# Patient Record
Sex: Female | Born: 1955 | Race: Black or African American | Hispanic: No | Smoking: Never smoker
Health system: Southern US, Community
[De-identification: ages and names within clinical notes are randomized; demographics above are authoritative.]

## PROBLEM LIST (undated history)

## (undated) DIAGNOSIS — E042 Nontoxic multinodular goiter: Principal | ICD-10-CM

## (undated) DIAGNOSIS — N6320 Unspecified lump in the left breast, unspecified quadrant: Secondary | ICD-10-CM

## (undated) DIAGNOSIS — D249 Benign neoplasm of unspecified breast: Secondary | ICD-10-CM

## (undated) DIAGNOSIS — E669 Obesity, unspecified: Secondary | ICD-10-CM

## (undated) DIAGNOSIS — E1129 Type 2 diabetes mellitus with other diabetic kidney complication: Secondary | ICD-10-CM

## (undated) DIAGNOSIS — E119 Type 2 diabetes mellitus without complications: Secondary | ICD-10-CM

## (undated) DIAGNOSIS — I10 Essential (primary) hypertension: Secondary | ICD-10-CM

## (undated) DIAGNOSIS — T7840XA Allergy, unspecified, initial encounter: Secondary | ICD-10-CM

## (undated) DIAGNOSIS — K219 Gastro-esophageal reflux disease without esophagitis: Secondary | ICD-10-CM

## (undated) DIAGNOSIS — K760 Fatty (change of) liver, not elsewhere classified: Principal | ICD-10-CM

## (undated) HISTORY — PX: COLONOSCOPY: SHX174

## (undated) HISTORY — DX: Nontoxic multinodular goiter: E04.2

## (undated) HISTORY — DX: Type 2 diabetes mellitus without complications: E11.9

## (undated) HISTORY — DX: Type 2 diabetes mellitus with other diabetic kidney complication: E11.29

## (undated) HISTORY — DX: Allergy, unspecified, initial encounter: T78.40XA

## (undated) HISTORY — PX: UPPER GASTROINTESTINAL ENDOSCOPY: SHX188

## (undated) HISTORY — DX: Obesity, unspecified: E66.9

## (undated) HISTORY — DX: Benign neoplasm of unspecified breast: D24.9

## (undated) HISTORY — PX: BIOPSY THYROID: PRO38

## (undated) HISTORY — DX: Gastro-esophageal reflux disease without esophagitis: K21.9

## (undated) HISTORY — DX: Fatty (change of) liver, not elsewhere classified: K76.0

## (undated) HISTORY — PX: TUBAL LIGATION: SHX77

## (undated) HISTORY — DX: Essential (primary) hypertension: I10

## (undated) HISTORY — PX: BREAST EXCISIONAL BIOPSY: SUR124

---

## 1997-11-11 ENCOUNTER — Ambulatory Visit (HOSPITAL_COMMUNITY): Admission: RE | Admit: 1997-11-11 | Discharge: 1997-11-11 | Payer: Self-pay | Admitting: Obstetrics and Gynecology

## 1998-12-09 ENCOUNTER — Ambulatory Visit (HOSPITAL_COMMUNITY): Admission: RE | Admit: 1998-12-09 | Discharge: 1998-12-09 | Payer: Self-pay | Admitting: Obstetrics and Gynecology

## 1998-12-09 ENCOUNTER — Encounter: Payer: Self-pay | Admitting: Obstetrics and Gynecology

## 1999-05-08 ENCOUNTER — Encounter: Payer: Self-pay | Admitting: Family Medicine

## 1999-05-08 LAB — CONVERTED CEMR LAB

## 1999-10-08 ENCOUNTER — Emergency Department (HOSPITAL_COMMUNITY): Admission: EM | Admit: 1999-10-08 | Discharge: 1999-10-08 | Payer: Self-pay | Admitting: *Deleted

## 1999-10-08 ENCOUNTER — Encounter: Payer: Self-pay | Admitting: *Deleted

## 2000-01-13 ENCOUNTER — Encounter: Payer: Self-pay | Admitting: Obstetrics and Gynecology

## 2000-01-13 ENCOUNTER — Ambulatory Visit (HOSPITAL_COMMUNITY): Admission: RE | Admit: 2000-01-13 | Discharge: 2000-01-13 | Payer: Self-pay | Admitting: Obstetrics and Gynecology

## 2001-01-22 ENCOUNTER — Encounter: Payer: Self-pay | Admitting: Family Medicine

## 2001-01-22 ENCOUNTER — Encounter: Admission: RE | Admit: 2001-01-22 | Discharge: 2001-01-22 | Payer: Self-pay | Admitting: Internal Medicine

## 2001-01-28 ENCOUNTER — Encounter: Admission: RE | Admit: 2001-01-28 | Discharge: 2001-02-27 | Payer: Self-pay | Admitting: Family Medicine

## 2001-02-15 ENCOUNTER — Encounter: Payer: Self-pay | Admitting: Obstetrics and Gynecology

## 2001-02-15 ENCOUNTER — Ambulatory Visit (HOSPITAL_COMMUNITY): Admission: RE | Admit: 2001-02-15 | Discharge: 2001-02-15 | Payer: Self-pay | Admitting: Obstetrics and Gynecology

## 2002-02-25 ENCOUNTER — Ambulatory Visit (HOSPITAL_COMMUNITY): Admission: RE | Admit: 2002-02-25 | Discharge: 2002-02-25 | Payer: Self-pay | Admitting: Obstetrics and Gynecology

## 2002-02-25 ENCOUNTER — Encounter: Payer: Self-pay | Admitting: Obstetrics and Gynecology

## 2003-05-19 ENCOUNTER — Ambulatory Visit (HOSPITAL_COMMUNITY): Admission: RE | Admit: 2003-05-19 | Discharge: 2003-05-19 | Payer: Self-pay | Admitting: Obstetrics and Gynecology

## 2003-05-19 ENCOUNTER — Encounter: Payer: Self-pay | Admitting: Obstetrics and Gynecology

## 2004-05-19 ENCOUNTER — Ambulatory Visit (HOSPITAL_COMMUNITY): Admission: RE | Admit: 2004-05-19 | Discharge: 2004-05-19 | Payer: Self-pay | Admitting: Obstetrics and Gynecology

## 2005-01-11 ENCOUNTER — Ambulatory Visit: Payer: Self-pay | Admitting: Family Medicine

## 2005-01-25 ENCOUNTER — Encounter: Admission: RE | Admit: 2005-01-25 | Discharge: 2005-01-25 | Payer: Self-pay | Admitting: Family Medicine

## 2005-01-31 ENCOUNTER — Ambulatory Visit: Payer: Self-pay | Admitting: Family Medicine

## 2005-04-25 ENCOUNTER — Ambulatory Visit: Payer: Self-pay | Admitting: Family Medicine

## 2005-05-17 ENCOUNTER — Ambulatory Visit: Payer: Self-pay | Admitting: Family Medicine

## 2005-05-23 ENCOUNTER — Ambulatory Visit (HOSPITAL_COMMUNITY): Admission: RE | Admit: 2005-05-23 | Discharge: 2005-05-23 | Payer: Self-pay | Admitting: Obstetrics and Gynecology

## 2005-07-18 ENCOUNTER — Ambulatory Visit: Payer: Self-pay | Admitting: Family Medicine

## 2005-09-20 ENCOUNTER — Ambulatory Visit: Payer: Self-pay | Admitting: Family Medicine

## 2006-01-10 ENCOUNTER — Ambulatory Visit: Payer: Self-pay | Admitting: Family Medicine

## 2006-06-18 ENCOUNTER — Ambulatory Visit (HOSPITAL_COMMUNITY): Admission: RE | Admit: 2006-06-18 | Discharge: 2006-06-18 | Payer: Self-pay | Admitting: Obstetrics and Gynecology

## 2006-06-22 ENCOUNTER — Ambulatory Visit: Payer: Self-pay | Admitting: Family Medicine

## 2006-07-25 ENCOUNTER — Ambulatory Visit: Payer: Self-pay | Admitting: Family Medicine

## 2007-06-24 ENCOUNTER — Ambulatory Visit (HOSPITAL_COMMUNITY): Admission: RE | Admit: 2007-06-24 | Discharge: 2007-06-24 | Payer: Self-pay | Admitting: Obstetrics and Gynecology

## 2007-06-28 ENCOUNTER — Telehealth: Payer: Self-pay | Admitting: Family Medicine

## 2007-10-28 ENCOUNTER — Telehealth: Payer: Self-pay | Admitting: Family Medicine

## 2007-10-31 ENCOUNTER — Encounter: Payer: Self-pay | Admitting: Family Medicine

## 2007-10-31 DIAGNOSIS — R7301 Impaired fasting glucose: Secondary | ICD-10-CM | POA: Insufficient documentation

## 2007-10-31 DIAGNOSIS — I10 Essential (primary) hypertension: Secondary | ICD-10-CM | POA: Insufficient documentation

## 2007-10-31 DIAGNOSIS — R739 Hyperglycemia, unspecified: Secondary | ICD-10-CM | POA: Insufficient documentation

## 2007-11-01 ENCOUNTER — Encounter: Payer: Self-pay | Admitting: Family Medicine

## 2007-11-01 ENCOUNTER — Ambulatory Visit: Payer: Self-pay | Admitting: Family Medicine

## 2007-11-01 DIAGNOSIS — G2581 Restless legs syndrome: Secondary | ICD-10-CM | POA: Insufficient documentation

## 2007-11-04 LAB — CONVERTED CEMR LAB
ALT: 17 units/L (ref 0–35)
Albumin: 4 g/dL (ref 3.5–5.2)
Alkaline Phosphatase: 76 units/L (ref 39–117)
BUN: 9 mg/dL (ref 6–23)
Basophils Absolute: 0.1 10*3/uL (ref 0.0–0.1)
Basophils Relative: 1.7 % — ABNORMAL HIGH (ref 0.0–1.0)
Bilirubin, Direct: 0.1 mg/dL (ref 0.0–0.3)
CO2: 29 meq/L (ref 19–32)
Calcium: 9.1 mg/dL (ref 8.4–10.5)
Cholesterol: 180 mg/dL (ref 0–200)
Creatinine, Ser: 0.9 mg/dL (ref 0.4–1.2)
GFR calc Af Amer: 85 mL/min
GFR calc non Af Amer: 70 mL/min
Glucose, Bld: 107 mg/dL — ABNORMAL HIGH (ref 70–99)
HCT: 38.3 % (ref 36.0–46.0)
Hemoglobin: 12.5 g/dL (ref 12.0–15.0)
LDL Cholesterol: 104 mg/dL — ABNORMAL HIGH (ref 0–99)
MCHC: 32.5 g/dL (ref 30.0–36.0)
Monocytes Absolute: 0.4 10*3/uL (ref 0.1–1.0)
Neutro Abs: 2 10*3/uL (ref 1.4–7.7)
Phosphorus: 3 mg/dL (ref 2.3–4.6)
Potassium: 4 meq/L (ref 3.5–5.1)
RBC: 4.94 M/uL (ref 3.87–5.11)
RDW: 12.9 % (ref 11.5–14.6)
Sodium: 140 meq/L (ref 135–145)
TSH: 0.69 microintl units/mL (ref 0.35–5.50)
Total Bilirubin: 1 mg/dL (ref 0.3–1.2)
Total CHOL/HDL Ratio: 2.9
Total Protein: 7.6 g/dL (ref 6.0–8.3)
Triglycerides: 73 mg/dL (ref 0–149)

## 2007-11-06 ENCOUNTER — Telehealth: Payer: Self-pay | Admitting: Family Medicine

## 2007-11-25 ENCOUNTER — Encounter: Payer: Self-pay | Admitting: Family Medicine

## 2008-08-12 ENCOUNTER — Ambulatory Visit (HOSPITAL_COMMUNITY): Admission: RE | Admit: 2008-08-12 | Discharge: 2008-08-12 | Payer: Self-pay | Admitting: Obstetrics and Gynecology

## 2008-12-05 LAB — CONVERTED CEMR LAB: Pap Smear: NORMAL

## 2008-12-30 ENCOUNTER — Ambulatory Visit: Payer: Self-pay | Admitting: Family Medicine

## 2008-12-30 DIAGNOSIS — L259 Unspecified contact dermatitis, unspecified cause: Secondary | ICD-10-CM | POA: Insufficient documentation

## 2008-12-30 DIAGNOSIS — D509 Iron deficiency anemia, unspecified: Secondary | ICD-10-CM | POA: Insufficient documentation

## 2009-01-01 LAB — CONVERTED CEMR LAB
ALT: 13 units/L (ref 0–35)
AST: 20 units/L (ref 0–37)
Albumin: 3.9 g/dL (ref 3.5–5.2)
Alkaline Phosphatase: 66 units/L (ref 39–117)
BUN: 10 mg/dL (ref 6–23)
Basophils Absolute: 0 10*3/uL (ref 0.0–0.1)
Basophils Relative: 0.8 % (ref 0.0–3.0)
Bilirubin, Direct: 0.1 mg/dL (ref 0.0–0.3)
CO2: 30 meq/L (ref 19–32)
Calcium: 9.2 mg/dL (ref 8.4–10.5)
Chloride: 112 meq/L (ref 96–112)
Cholesterol: 169 mg/dL (ref 0–200)
Creatinine, Ser: 0.8 mg/dL (ref 0.4–1.2)
Eosinophils Absolute: 0.2 10*3/uL (ref 0.0–0.7)
Eosinophils Relative: 4.5 % (ref 0.0–5.0)
Ferritin: 35 ng/mL (ref 10.0–291.0)
Glucose, Bld: 100 mg/dL — ABNORMAL HIGH (ref 70–99)
HCT: 35.3 % — ABNORMAL LOW (ref 36.0–46.0)
HDL: 55.5 mg/dL (ref >39.00)
Hemoglobin: 12.1 g/dL (ref 12.0–15.0)
Hgb A1c MFr Bld: 5.6 % (ref 4.6–6.5)
LDL Cholesterol: 103 mg/dL — ABNORMAL HIGH (ref 0–99)
Lymphocytes Relative: 42.9 % (ref 12.0–46.0)
Lymphs Abs: 2.1 10*3/uL (ref 0.7–4.0)
MCHC: 34.4 g/dL (ref 30.0–36.0)
MCV: 74.8 fL — ABNORMAL LOW (ref 78.0–100.0)
Monocytes Absolute: 0.4 10*3/uL (ref 0.1–1.0)
Monocytes Relative: 7.5 % (ref 3.0–12.0)
Neutro Abs: 2.2 10*3/uL (ref 1.4–7.7)
Neutrophils Relative %: 44.3 % (ref 43.0–77.0)
Phosphorus: 3.4 mg/dL (ref 2.3–4.6)
Platelets: 152 10*3/uL (ref 150.0–400.0)
Potassium: 3.8 meq/L (ref 3.5–5.1)
RBC: 4.71 M/uL (ref 3.87–5.11)
RDW: 12.7 % (ref 11.5–14.6)
Sodium: 144 meq/L (ref 135–145)
TSH: 0.63 microintl units/mL (ref 0.35–5.50)
Total Bilirubin: 0.9 mg/dL (ref 0.3–1.2)
Total CHOL/HDL Ratio: 3
Total Protein: 7.3 g/dL (ref 6.0–8.3)
Triglycerides: 53 mg/dL (ref 0.0–149.0)
VLDL: 10.6 mg/dL (ref 0.0–40.0)
WBC: 4.9 10*3/uL (ref 4.5–10.5)

## 2009-08-07 HISTORY — PX: COLONOSCOPY: SHX174

## 2009-09-09 ENCOUNTER — Ambulatory Visit (HOSPITAL_COMMUNITY): Admission: RE | Admit: 2009-09-09 | Discharge: 2009-09-09 | Payer: Self-pay | Admitting: Obstetrics and Gynecology

## 2009-12-10 ENCOUNTER — Telehealth: Payer: Self-pay | Admitting: Family Medicine

## 2010-08-28 ENCOUNTER — Encounter: Payer: Self-pay | Admitting: Obstetrics and Gynecology

## 2010-09-08 NOTE — Progress Notes (Signed)
Summary: Fill out form for [facility] care  Phone Note Call from Patient Call back at [phone]   Caller: Patient Call For: Judith Part MD Summary of Call: Patient wants to know if Dr. Milinda Antis will fill out a form for her until she can get an appt to be seen.  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  Will you fill out the form for her now and then allow her to come in for a physical later?  Please advise.   Initial call taken by: Linde Gillis CMA Duncan Dull),  Dec 10, 2009 8:21 AM  Follow-up for Phone Call        since she has not been seen since 5/26- will likely need to be seen  may need to put her in for first avail 15 min visit just to do the form  Follow-up by: Judith Part MD,  Dec 10, 2009 1:58 PM  Additional Follow-up for Phone Call Additional follow up Details #1::        Patient notified as instructed by telephone. Pt said she was going to check with Dr office that did the tb skin test because they did BP also to see if they will sign the form. If not pt will call back for appt.Lewanda Rife LPN  Dec 10, 4401 4:54 PM

## 2010-09-26 ENCOUNTER — Other Ambulatory Visit: Payer: Self-pay | Admitting: Obstetrics and Gynecology

## 2010-09-26 DIAGNOSIS — Z1231 Encounter for screening mammogram for malignant neoplasm of breast: Secondary | ICD-10-CM

## 2010-10-05 ENCOUNTER — Ambulatory Visit (HOSPITAL_COMMUNITY): Payer: BC Managed Care – PPO

## 2010-10-10 ENCOUNTER — Ambulatory Visit (HOSPITAL_COMMUNITY): Payer: BC Managed Care – PPO

## 2010-10-14 ENCOUNTER — Ambulatory Visit (HOSPITAL_COMMUNITY)
Admission: RE | Admit: 2010-10-14 | Discharge: 2010-10-14 | Disposition: A | Discharge status: Home / Self Care | Payer: BC Managed Care – PPO | Source: Ambulatory Visit | Attending: Obstetrics and Gynecology | Admitting: Obstetrics and Gynecology

## 2010-10-14 DIAGNOSIS — Z1231 Encounter for screening mammogram for malignant neoplasm of breast: Secondary | ICD-10-CM | POA: Insufficient documentation

## 2011-02-28 ENCOUNTER — Other Ambulatory Visit (HOSPITAL_COMMUNITY)
Admission: RE | Admit: 2011-02-28 | Discharge: 2011-02-28 | Disposition: A | Discharge status: Home / Self Care | Payer: BC Managed Care – PPO | Source: Ambulatory Visit | Attending: Gynecology | Admitting: Gynecology

## 2011-02-28 ENCOUNTER — Ambulatory Visit (INDEPENDENT_AMBULATORY_CARE_PROVIDER_SITE_OTHER): Payer: BC Managed Care – PPO | Admitting: Gynecology

## 2011-02-28 VITALS — BP 130/70 | Ht 67.0 in | Wt 222.0 lb

## 2011-02-28 DIAGNOSIS — Z01419 Encounter for gynecological examination (general) (routine) without abnormal findings: Secondary | ICD-10-CM | POA: Insufficient documentation

## 2011-02-28 DIAGNOSIS — L209 Atopic dermatitis, unspecified: Secondary | ICD-10-CM

## 2011-02-28 DIAGNOSIS — L2089 Other atopic dermatitis: Secondary | ICD-10-CM

## 2011-02-28 MED ORDER — BETAMETHASONE DIPROPIONATE AUG 0.05 % EX CREA: TOPICAL_CREAM | Freq: Two times a day (BID) | CUTANEOUS | Status: DC

## 2011-02-28 NOTE — Progress Notes (Addendum)
Nancy Deleon 1956-02-03 [id number]    History:    The patient presents for annual exam.  She is overall doing well and is without complaints other than a rash on her skin has been there for several weeks. It is on several areas on her forearm she tried putting lotion and Neosporin but does not seem to help. She notes no overt to contact allergies or other inciting causes. She is postmenopausal by 4 uterus is not having significant hot flashes or sweats and no postmenopausal bleeding.   Past medical history, past surgical history, family history and social history were all reviewed and documented in the EPIC chart.   ROS:  A 14 point ROS was performed and pertinent positives and negatives are included in the history.  Exam:  Filed Vitals:   02/28/11 1421  BP: 130/70    General appearance:  Normal Head/Neck:  Normal, without cervical or supraclavicular adenopathy. Thyroid:  Symmetrical, normal in size, without palpable masses or nodularity. Respiratory  Effort:  Normal  Auscultation:  Clear without wheezing or rhonchi Cardiovascular  Auscultation:  Regular rate, without rubs, murmurs or gallops  Edema/varicosities:  Not grossly evident Abdominal  Masses/tenderness:  Soft,nontender, without masses, guarding or rebound.  Liver/spleen:  No organomegaly noted  Hernia:  None appreciated  Occult test:   Skin  Inspection:  Grossly normal  Palpation:  Grossly normal Neurologic/psychiatric  Orientation:  Normal with appropriate conversation.  Mood/affect:  Normal  Genitourinary with chaperone present    Breasts: Examined lying and sitting.     Right: Without masses, retractions, discharge or axillary adenopathy.     Left: Without masses, retractions, discharge or axillary adenopathy.   Inguinal/mons:  Normal without inguinal adenopathy  External genitalia:  Normal  BUS/Urethra/Skene's glands:  Normal, mild atrophic changes noted  Bladder:  Normal  Vagina:  Normal, mild  atrophic changes noted  Cervix:  Normal  Uterus:  Antiverted, normal in size, shape and contour.  Midline and mobile  Adnexa/parametria:     Rt: Without masses or tenderness.   Lt: Without masses or tenderness.  Anus and perineum: Normal  Digital rectal exam: Normal sphincter tone without palpated masses or tenderness.   Assessment/Plan:  55 y.o. year old female for annual exam doing well she's up-to-date with her mammograms and colonoscopies never had DEXA. Increased calcium vitamin D reviewed with her we'll plan DEXA in the next several years. No blood work was done today or any labs that she has this done through her primary is following her for her hypertension and borderline diabetes. Discussed her rash, I think this is a form of atopic dermatitis have recommended that the Prolene 0.05% cream that we've prescribed this for her we'll apply this twice a day and if her rash persists she knows to call me and I'll refer to dermatology. Otherwise assuming she continues well she'll see [place] in a year sooner as needed.Dara Lords MD, 3:08 PM 02/28/2011     Skin exam revealed a superficial scaly irritative dermitis type rash in patches on both arms in several isolated areas.that was not documented in the original dictated exam.

## 2011-04-12 ENCOUNTER — Encounter: Payer: Self-pay | Admitting: Gynecology

## 2011-04-12 ENCOUNTER — Ambulatory Visit (INDEPENDENT_AMBULATORY_CARE_PROVIDER_SITE_OTHER): Payer: BC Managed Care – PPO | Admitting: Gynecology

## 2011-04-12 DIAGNOSIS — N63 Unspecified lump in unspecified breast: Secondary | ICD-10-CM

## 2011-04-12 DIAGNOSIS — N643 Galactorrhea not associated with childbirth: Secondary | ICD-10-CM

## 2011-04-12 NOTE — Progress Notes (Signed)
Patient presents complaining of right nipple discharge of the last 2 days and mass in her right nipple. No history of this previously the discharge is milky. She recently had her annual GYN exam in July where her exam was normal.   Exam Breastswere examined lying and sitting. Left without masses retractions  Adenopathy, clear scant galactorrhea with expression. Right scant clear to milky galactorrhea with expression. 1 cm firm nodule middle of base of nipple. No overlying skin changes no other masses retractions or axillary adenopathy.  Assessment and plan: Bilateral galactorrhea with new right nipple mass. Discussed differential to include cystic dilatation of the duct, possible papilloma, possible malignancy. We'll check baseline prolactin and ordered diagnostic mammography with ultrasound. A reviewed with her that regardless we will have general surgery see her for one week the results of her x-ray ultrasound studies and then we'll make that arrangement. Patient knows importance of followup if she does not hear from my office to arrange the mammogram ultrasound to call us in several days

## 2011-04-13 ENCOUNTER — Telehealth: Payer: Self-pay | Admitting: *Deleted

## 2011-04-13 DIAGNOSIS — N6452 Nipple discharge: Secondary | ICD-10-CM

## 2011-04-13 NOTE — Telephone Encounter (Signed)
Message copied by Aura Camps on Thu Apr 13, 2011  2:39 PM ------      Message from: Dara Lords      Created: Wed Apr 12, 2011 12:29 PM       Call patient and tell her I wanted check a baseline prolactin level. I put the order in. I forgot to get this when she was in the office and tell her sorry but she'll have to come back to have blood drawn.  I also put in for diagnostic mammogram and ultrasound at the breast Center through [facility] that somebody may want to schedule before calling her for the prolactin that way tell her both things at the same time.

## 2011-04-13 NOTE — Telephone Encounter (Signed)
Lm for pt to call regarding the below note. 

## 2011-04-14 ENCOUNTER — Ambulatory Visit
Admission: RE | Admit: 2011-04-14 | Discharge: 2011-04-14 | Disposition: A | Discharge status: Home / Self Care | Payer: BC Managed Care – PPO | Source: Ambulatory Visit | Attending: Gynecology | Admitting: Gynecology

## 2011-04-14 ENCOUNTER — Other Ambulatory Visit: Payer: Self-pay | Admitting: Gynecology

## 2011-04-14 DIAGNOSIS — N643 Galactorrhea not associated with childbirth: Secondary | ICD-10-CM

## 2011-04-14 DIAGNOSIS — N63 Unspecified lump in unspecified breast: Secondary | ICD-10-CM

## 2011-04-14 NOTE — Telephone Encounter (Signed)
Pt had appointment at breast center on 04/14/11 at 7:40 a.m. Still waiting for pt to call regarding prolactin level recheck.

## 2011-04-19 ENCOUNTER — Ambulatory Visit (INDEPENDENT_AMBULATORY_CARE_PROVIDER_SITE_OTHER): Payer: BC Managed Care – PPO | Admitting: General Surgery

## 2011-04-19 ENCOUNTER — Encounter (INDEPENDENT_AMBULATORY_CARE_PROVIDER_SITE_OTHER): Payer: Self-pay | Admitting: General Surgery

## 2011-04-19 VITALS — BP 128/82 | HR 70 | Temp 97.4°F | Ht 67.0 in | Wt 219.8 lb

## 2011-04-19 DIAGNOSIS — N6452 Nipple discharge: Secondary | ICD-10-CM

## 2011-04-19 DIAGNOSIS — N6310 Unspecified lump in the right breast, unspecified quadrant: Secondary | ICD-10-CM

## 2011-04-19 DIAGNOSIS — N6459 Other signs and symptoms in breast: Secondary | ICD-10-CM

## 2011-04-19 DIAGNOSIS — N63 Unspecified lump in unspecified breast: Secondary | ICD-10-CM

## 2011-04-19 NOTE — Progress Notes (Signed)
Chief Complaint  Patient presents with  . Other    Eval right breast - nipple discharge and mass. Dr. Chilton Si [phone]    HPI Nancy Deleon is a 55 y.o. female.   HPI This is a 55 year old female who was otherwise healthy. She's had no prior history of any breast complaints her abnormal mammograms. Last Thursday she noticed some clear discharge when she was doing around self breast exam. This has been present and is only been when she is manipulated her breasts and has only been clear. She has had some from the left side as well over this time. Her main complaint is that she noticed an actual mass in her nipple. She's been evaluated with a mammogram as well as ultrasound on the right side which are both negative. She presents today for options concerning a mass that is readily palpable. She has no other symptoms associated with it outside of its presence. Past Medical History  Diagnosis Date  . Hypertension     Past Surgical History  Procedure Date  . Tubal ligation   . Cesarean section [id number]    Family History  Problem Relation Age of Onset  . Lymphoma Mother   . Cancer Mother     lymphoma  . Cancer Father     lung  . Cancer Maternal Uncle     colon    Social History History  Substance Use Topics  . Smoking status: Former Smoker    Quit date: 04/18/1985  . Smokeless tobacco: Never Used  . Alcohol Use: Yes     glass of wine with meals - sometimes    Allergies  Allergen Reactions  . Naproxen     REACTION: vomiting  . Vicodin (Hydrocodone-Acetaminophen) Nausea And Vomiting  . Propoxyphene N-Acetaminophen Nausea And Vomiting    Current Outpatient Prescriptions  Medication Sig Dispense Refill  . amLODipine (NORVASC) 10 MG tablet Take 10 mg by mouth daily.        Marland Kitchen augmented betamethasone dipropionate (DIPROLENE AF) 0.05 % cream Apply topically 2 (two) times daily.  30 g  0    Review of Systems Review of Systems  Constitutional: Negative.   HENT: Negative.     Eyes: Negative.   Respiratory: Negative.   Cardiovascular: Negative.   Gastrointestinal: Negative.   Genitourinary: Negative.   Musculoskeletal: Negative.   Neurological: Negative.   Hematological: Negative.   Psychiatric/Behavioral: Negative.     Blood pressure 128/82, pulse 70, temperature 97.4 F (36.3 C), temperature source Temporal, height 5\' 7"  (1.702 m), weight 219 lb 12.8 oz (99.701 kg), last menstrual period 02/28/2007.  Physical Exam Physical Exam  Constitutional: She appears well-developed and well-nourished.  Neck: Neck supple.  Cardiovascular: Normal rate, regular rhythm and normal heart sounds.   Pulmonary/Chest: Effort normal. She has no wheezes. She has no rales. Right breast exhibits inverted nipple, mass (1 cm mobile nontender mass in her right nipple) and nipple discharge (clear multiductal 6-9 o'clock ). Right breast exhibits no skin change and no tenderness. Left breast exhibits nipple discharge (clear expressed on exam). Left breast exhibits no inverted nipple, no mass, no skin change and no tenderness. Breasts are symmetrical.  Lymphadenopathy:    She has no cervical adenopathy.    She has no axillary adenopathy.     Assessment    Right nipple mass and discharge    Plan    She has a normal mammogram and what really is likely benign discharge. However I am more concerned that she  does have a mass that actually is in her right nipple. Both she and I can palpate this readily and I think this is most likely a benign mass. She and I discussed possible options in the way to really ensure this is not a malignancy is to excise it. I recommended excision and she is very much in favor of that. We discussed the risks and benefits of that.       Thu Baggett 04/19/2011, 10:53 AM

## 2011-04-20 ENCOUNTER — Encounter (HOSPITAL_BASED_OUTPATIENT_CLINIC_OR_DEPARTMENT_OTHER)
Admission: RE | Admit: 2011-04-20 | Discharge: 2011-04-20 | Disposition: A | Discharge status: Home / Self Care | Payer: BC Managed Care – PPO | Source: Ambulatory Visit | Attending: General Surgery | Admitting: General Surgery

## 2011-04-20 LAB — BASIC METABOLIC PANEL
BUN: 11 mg/dL (ref 6–23)
CO2: 28 mEq/L (ref 19–32)
Calcium: 9.7 mg/dL (ref 8.4–10.5)
Chloride: 104 mEq/L (ref 96–112)
Creatinine, Ser: 0.76 mg/dL (ref 0.50–1.10)
GFR calc Af Amer: >60 mL/min (ref >60)
GFR calc non Af Amer: >60 mL/min (ref >60)
Glucose, Bld: 121 mg/dL — ABNORMAL HIGH (ref 70–99)
Potassium: 3.6 mEq/L (ref 3.5–5.1)
Sodium: 139 mEq/L (ref 135–145)

## 2011-04-20 LAB — DIFFERENTIAL
Basophils Absolute: 0.1 10*3/uL (ref 0.0–0.1)
Basophils Relative: 1 % (ref 0–1)
Eosinophils Absolute: 0.1 10*3/uL (ref 0.0–0.7)
Eosinophils Relative: 2 % (ref 0–5)
Lymphocytes Relative: 41 % (ref 12–46)
Lymphs Abs: 2.3 10*3/uL (ref 0.7–4.0)
Monocytes Absolute: 0.4 10*3/uL (ref 0.1–1.0)
Monocytes Relative: 7 % (ref 3–12)
Neutrophils Relative %: 49 % (ref 43–77)

## 2011-04-20 LAB — CBC
HCT: 36.8 % (ref 36.0–46.0)
Hemoglobin: 13.4 g/dL (ref 12.0–15.0)
MCH: 25.8 pg — ABNORMAL LOW (ref 26.0–34.0)
MCV: 70.9 fL — ABNORMAL LOW (ref 78.0–100.0)
Platelets: 165 10*3/uL (ref 150–400)
RBC: 5.19 MIL/uL — ABNORMAL HIGH (ref 3.87–5.11)
RDW: 13.6 % (ref 11.5–15.5)
WBC: 5.6 10*3/uL (ref 4.0–10.5)

## 2011-04-24 ENCOUNTER — Other Ambulatory Visit (INDEPENDENT_AMBULATORY_CARE_PROVIDER_SITE_OTHER): Payer: Self-pay | Admitting: General Surgery

## 2011-04-24 ENCOUNTER — Ambulatory Visit (HOSPITAL_BASED_OUTPATIENT_CLINIC_OR_DEPARTMENT_OTHER)
Admission: RE | Admit: 2011-04-24 | Discharge: 2011-04-24 | Disposition: A | Discharge status: Home / Self Care | Payer: BC Managed Care – PPO | Source: Ambulatory Visit | Attending: General Surgery | Admitting: General Surgery

## 2011-04-24 DIAGNOSIS — I10 Essential (primary) hypertension: Secondary | ICD-10-CM | POA: Insufficient documentation

## 2011-04-24 DIAGNOSIS — Z01810 Encounter for preprocedural cardiovascular examination: Secondary | ICD-10-CM | POA: Insufficient documentation

## 2011-04-24 DIAGNOSIS — D249 Benign neoplasm of unspecified breast: Secondary | ICD-10-CM | POA: Insufficient documentation

## 2011-04-24 DIAGNOSIS — Z01812 Encounter for preprocedural laboratory examination: Secondary | ICD-10-CM | POA: Insufficient documentation

## 2011-04-24 HISTORY — PX: BREAST BIOPSY: SHX20

## 2011-04-25 NOTE — Op Note (Signed)
  NAMEJAHAYRA, Nancy Deleon              ACCOUNT NO.:  [id number]  MEDICAL RECORD NO.:  [id number]  LOCATION:                                 FACILITY:  PHYSICIAN:  Juanetta Gosling, MDDATE OF BIRTH:  20-Dec-1955  DATE OF PROCEDURE:  04/24/2011 DATE OF DISCHARGE:                              OPERATIVE REPORT   PREOPERATIVE DIAGNOSIS:  Right nipple mass and discharge.  POSTOPERATIVE DIAGNOSIS:  Right nipple mass and discharge.  PROCEDURE:  Right breast mass excisional biopsy.  SURGEON:  Troy Sine. Dwain Sarna, MD  ASSISTANT:  None.  ANESTHESIA:  General.  SPECIMENS:  Right breast tissue to pathology.  ESTIMATED BLOOD LOSS:  Minimal.  COMPLICATIONS:  None.  DRAINS:  None.  DISPOSITION:  To recovery room in stable condition.  INDICATIONS:  This is a 55 year old female who has no prior breast history or any abnormal mammograms who noted some clear discharge from both sides, which primarily is from her right side.  She had a normal mammogram and an ultrasound, but she had actual mass in her nipple as well.  She and I both palpated this and discussed her options, and we discussed excision of this mass in the operating room.  PROCEDURE:  After informed consent was obtained, the patient was taken to the operating room.  She and I had both previously marked this area prior to beginning.  She had sequential compression devices placed on lower extremities prior to induction with anesthesia.  She was then placed under general anesthesia with an LMA.  She was then prepped and draped in standard sterile surgical fashion.  Surgical time-out was then performed.  I infiltrated with 0.25% Marcaine throughout the lateral portion of her areolar border.  I then made a periareolar incision and carried this out underneath her nipple.  The mass was identified.  I excised this in total.  This appeared to be more of an epidermal inclusion cyst, but then it was removed in its entirety.  I did  remove a couple of small areas of thickening below this as well and passed all these off the table as a specimen.  It could not really be marked at all due to the size of these and it did appear to be benign, so I did not mark them.  I then observed hemostasis.  This was closed with 3-0 Vicryl, 4-0 Monocryl.  Steri-Strips and sterile dressing were placed. She tolerated this well, was extubated in the operating room, transferred to recovery room in stable condition.     Juanetta Gosling, MD     MCW/MEDQ  D:  04/24/2011  T:  04/24/2011  Job:  [id number]  cc:   Marne A. Milinda Antis, MD Harrel Lemon, MD  Electronically Signed by Emelia Loron MD on 04/25/2011 01:40:37 PM

## 2011-04-27 ENCOUNTER — Other Ambulatory Visit (INDEPENDENT_AMBULATORY_CARE_PROVIDER_SITE_OTHER): Payer: BC Managed Care – PPO | Admitting: *Deleted

## 2011-04-27 DIAGNOSIS — N643 Galactorrhea not associated with childbirth: Secondary | ICD-10-CM

## 2011-04-27 NOTE — Telephone Encounter (Signed)
Spoke with pt and she will come in today to get prolactin level checked.

## 2011-05-15 ENCOUNTER — Ambulatory Visit (INDEPENDENT_AMBULATORY_CARE_PROVIDER_SITE_OTHER): Payer: BC Managed Care – PPO | Admitting: General Surgery

## 2011-05-15 ENCOUNTER — Encounter (INDEPENDENT_AMBULATORY_CARE_PROVIDER_SITE_OTHER): Payer: Self-pay | Admitting: General Surgery

## 2011-05-15 VITALS — BP 126/84 | HR 60 | Temp 96.0°F | Resp 18 | Ht 67.0 in | Wt 223.5 lb

## 2011-05-15 DIAGNOSIS — Z09 Encounter for follow-up examination after completed treatment for conditions other than malignant neoplasm: Secondary | ICD-10-CM

## 2011-05-15 NOTE — Progress Notes (Signed)
Subjective:     Patient ID: Nancy Deleon, female   DOB: 1955/09/17, 55 y.o.   MRN: [id number]  HPI This is a 55 year old female who had some right nipple discharge as well as a nipple mass. I to the operating room couple weeks ago and a superior areolar incision and excise this mass. She is doing well with some tenderness at the site but is otherwise has no complaints. 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  Review of Systems     Objective:   Physical Exam    healing right breast periareolar incision without infection, steristrips in place Assessment:     Right breast biopsy    Plan:        We discussed her pathology today. We discussed continuing with her room monthly self exams, yearly mammograms, clinical exams by her primary care physician. She is going to come back and see me as needed.

## 2011-05-15 NOTE — Patient Instructions (Signed)
Breast Problems and Self Exam Completing monthly breast exams may pick up problems early and save lives. There can be numerous causes of swelling, tenderness or lumps in the breasts. Some of these causes are:   Fibrocystic breast syndrome (noncancerous lumps). This is the most common cause of lumps in the breast.   Fibroadenoma breast tumors of unknown cause. These are noncancerous (benign) lumps.   Benign fatty tumors (lipomas).   Cancer of the breast.  By doing monthly breast exams, you get to know how your breasts feel and how they can change from month to month. This allows you to notice changes early. It can also offer you some reassurance that your breast health is good.  BREAST SELF EXAM There are a few points to follow when doing a thorough breast exam. The best time to examine your breasts is 5 to 7 days after your menstrual period is over. During menstruation, the breasts are lumpier, and it may be more difficult to pick up changes. If you do not menstruate, have reached menopause, or had a hysterectomy (uterus removal), examine your breasts the first day of every month. After 3 to 4 months, you will become more familiar with the variations of your breasts and more comfortable with the exam.  Perform your breast exam monthly. Keep a written record with breast changes or normal findings for each breast. This makes it easier to be sure of changes, so you do not need to depend only on memory for size, tenderness, or location. Try to do the exam at the same time each month, and write down where you are in your menstrual cycle, if you are still menstruating.   Look at your breasts. Stand in front of a mirror with your hands clasped behind your head. Tighten your chest muscles and look for asymmetry. This means a difference in shape or contour from one breast to the other, such as puckers, dips or bumps. Also, look for skin changes.    Lean forward with your hands on your hips. Again, look for  symmetry and skin changes.   While showering, soap the breasts. Then, carefully feel the breasts with your fingertips, while holding the other arm (on the side of the breast you are examining) over your head. Do this with each breast, carefully feeling for lumps or changes. Typically, a circular motion with moderate fingertip pressure should be used.    Repeat this exam while lying on your back. Put your arm behind your head and a pillow under your shoulders. Again, use your fingertips to examine both breasts, feeling for lumps and thickening. Begin at the top of your breast, and go clockwise around the whole breast.   At the end of your exam, gently squeeze each nipple to see if there is any drainage of fluids. Look for nipple changes, dimpling, or redness.   Lastly, examine the upper chest and collarbone (clavicle) areas, and in your armpits.  It is not necessary to be alarmed if you find a breast lump. Most of them are not cancerous. However, it is necessary to see your caregiver if a lump is found, in order to have it looked at. Document Released: 07/24/2005 Document Re-Released: 08/15/2009 Exit[employer] Patient Information 2011 [employer]. 

## 2011-11-02 ENCOUNTER — Other Ambulatory Visit: Payer: Self-pay | Admitting: Gynecology

## 2011-11-02 DIAGNOSIS — Z1231 Encounter for screening mammogram for malignant neoplasm of breast: Secondary | ICD-10-CM

## 2011-11-07 ENCOUNTER — Ambulatory Visit (HOSPITAL_COMMUNITY)
Admission: RE | Admit: 2011-11-07 | Discharge: 2011-11-07 | Disposition: A | Discharge status: Home / Self Care | Payer: BC Managed Care – PPO | Source: Ambulatory Visit | Attending: Gynecology | Admitting: Gynecology

## 2011-11-07 DIAGNOSIS — Z1231 Encounter for screening mammogram for malignant neoplasm of breast: Secondary | ICD-10-CM | POA: Insufficient documentation

## 2011-11-07 LAB — HM MAMMOGRAPHY: HM Mammogram: NORMAL

## 2011-11-21 ENCOUNTER — Ambulatory Visit (INDEPENDENT_AMBULATORY_CARE_PROVIDER_SITE_OTHER): Payer: BC Managed Care – PPO | Admitting: Family

## 2011-11-21 ENCOUNTER — Encounter: Payer: Self-pay | Admitting: Family

## 2011-11-21 ENCOUNTER — Other Ambulatory Visit: Payer: Self-pay | Admitting: *Deleted

## 2011-11-21 VITALS — BP 130/76 | HR 72 | Temp 98.2°F | Resp 16

## 2011-11-21 DIAGNOSIS — J309 Allergic rhinitis, unspecified: Secondary | ICD-10-CM

## 2011-11-21 DIAGNOSIS — R7309 Other abnormal glucose: Secondary | ICD-10-CM

## 2011-11-21 DIAGNOSIS — G2581 Restless legs syndrome: Secondary | ICD-10-CM

## 2011-11-21 DIAGNOSIS — E119 Type 2 diabetes mellitus without complications: Secondary | ICD-10-CM

## 2011-11-21 DIAGNOSIS — J302 Other seasonal allergic rhinitis: Secondary | ICD-10-CM

## 2011-11-21 DIAGNOSIS — I10 Essential (primary) hypertension: Secondary | ICD-10-CM

## 2011-11-21 DIAGNOSIS — R739 Hyperglycemia, unspecified: Secondary | ICD-10-CM

## 2011-11-21 DIAGNOSIS — Z111 Encounter for screening for respiratory tuberculosis: Secondary | ICD-10-CM

## 2011-11-21 MED ORDER — AMLODIPINE BESYLATE 10 MG PO TABS: 10.0000 mg | ORAL_TABLET | Freq: Every day | ORAL | Status: DC

## 2011-11-21 NOTE — Patient Instructions (Signed)
Please follow up on Wednesday for PPD reading.  Complete your lab work today prior to leaving.  Welcome to [facility] in [place]!

## 2011-11-21 NOTE — Progress Notes (Signed)
  Subjective:    Patient ID: Nancy Deleon, female    DOB: 1956/02/05, 56 y.o.   MRN: [id number]  HPI  [name] is a 56 yr old female who presents today to establish care. She has been followed at the [facility] office.    Diabetes- reports that her last A1C.  Denies polyuria, polydipsia.   HTN- on amlodipine.  She denies swelling, cp or shortness of breath.  RLS-  Reports that if she is walking regularly it does not bother her.  Allergies- itchy eyes, runny nose.  She has tried zyrtec.      Review of Systems See HPI  Past Medical History  Diagnosis Date  . Hypertension     History   Social History  . Marital Status: Widowed    Spouse Name: N/A    Number of Children: 2  . Years of Education: N/A   Occupational History  .     Social History Main Topics  . Smoking status: Former Smoker    Quit date: 04/18/1985  . Smokeless tobacco: Never Used  . Alcohol Use: Yes     glass of wine with meals - sometimes  . Drug Use: No  . Sexually Active: Yes -- Female partner(s)    Birth Control/ Protection: Post-menopausal   Other Topics Concern  . 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 Tubal ligation   . Cesarean section [id number]  . Breast biopsy 04/24/11    rigth breast    Family History  Problem Relation Age of Onset  . Lymphoma Mother   . Cancer Mother     lymphoma  . Dementia Mother   . Cancer Father     lung  . Cancer Maternal Uncle     colon    Allergies  Allergen Reactions  . Naproxen     REACTION: vomiting  . Vicodin (Hydrocodone-Acetaminophen) Nausea And Vomiting  . Propoxacet-N Nausea And Vomiting    Current Outpatient Prescriptions on File Prior to Visit  Medication  Sig Dispense Refill  . augmented betamethasone dipropionate (DIPROLENE AF) 0.05 % cream Apply topically 2 (two) times daily.  30 g  0  . cetirizine (ZYRTEC) 10 MG tablet Take 10 mg by mouth daily.      Marland Kitchen DISCONTD: amLODipine (NORVASC) 10 MG tablet Take 10 mg by mouth daily.          BP 130/76  Pulse 72  Temp(Src) 98.2 F (36.8 C) (Oral)  Resp 16  SpO2 99%  LMP 02/28/2007       Objective:   Physical Exam  Constitutional: She appears well-developed and well-nourished. No distress.  Cardiovascular: Normal rate and regular rhythm.   No murmur heard. Pulmonary/Chest: Effort normal and breath sounds normal. No respiratory distress. She has no wheezes. She has no rales. She exhibits no tenderness.  Musculoskeletal: She exhibits no edema.  Skin: Skin is warm and dry. No erythema.  Psychiatric: She has a normal mood and affect. Her behavior is normal. Judgment and thought content normal.          Assessment & Plan:

## 2011-11-21 NOTE — Assessment & Plan Note (Signed)
Last A1C on file in 2010 was normal.  Will repeat today.

## 2011-11-21 NOTE — Assessment & Plan Note (Signed)
Stable.  Monitor.  

## 2011-11-21 NOTE — Telephone Encounter (Signed)
Left message on cell re: additional refills on mailorder.

## 2011-11-21 NOTE — Assessment & Plan Note (Signed)
BP stable on current meds.  Obtain BMET. Continue same.

## 2011-11-21 NOTE — Assessment & Plan Note (Signed)
Deteriorated. Add zyrtec.

## 2011-11-21 NOTE — Telephone Encounter (Signed)
Called [facility] to call in verbal refill for amlodipine. Per [facility], rx still has 3 refills on it; did not give further refills at this time.

## 2011-11-22 ENCOUNTER — Encounter: Payer: Self-pay | Admitting: Family

## 2011-11-22 LAB — BASIC METABOLIC PANEL WITH GFR
BUN: 15 mg/dL (ref 6–23)
Calcium: 9.7 mg/dL (ref 8.4–10.5)
Creat: 0.86 mg/dL (ref 0.50–1.10)
GFR, Est African American: 88 mL/min
Glucose, Bld: 103 mg/dL — ABNORMAL HIGH (ref 70–99)
Potassium: 4.5 mEq/L (ref 3.5–5.3)

## 2011-11-23 ENCOUNTER — Ambulatory Visit: Payer: BC Managed Care – PPO | Admitting: Family

## 2011-11-23 DIAGNOSIS — Z111 Encounter for screening for respiratory tuberculosis: Secondary | ICD-10-CM

## 2011-11-23 LAB — TB SKIN TEST

## 2011-11-23 NOTE — Progress Notes (Signed)
  Subjective:    Patient ID: Nancy Deleon, female    DOB: October 15, 1955, 56 y.o.   MRN: [id number]  HPI  The patient presented to the office for evaluation of PPD skin test.  Review of Systems     Objective:   Physical Exam  No redness or induration noted.      Assessment & Plan:   Negative PPD skin test.  The patient was advised to call the office if she experiences any irritation of injection site.     Mervin Kung, CMA (AAMA)

## 2011-12-18 ENCOUNTER — Encounter: Payer: Self-pay | Admitting: Family

## 2011-12-18 ENCOUNTER — Ambulatory Visit (INDEPENDENT_AMBULATORY_CARE_PROVIDER_SITE_OTHER): Payer: BC Managed Care – PPO | Admitting: Family

## 2011-12-18 VITALS — BP 129/76 | HR 69 | Temp 98.0°F | Resp 12 | Wt 225.0 lb

## 2011-12-18 DIAGNOSIS — L309 Dermatitis, unspecified: Secondary | ICD-10-CM

## 2011-12-18 DIAGNOSIS — R21 Rash and other nonspecific skin eruption: Secondary | ICD-10-CM | POA: Insufficient documentation

## 2011-12-18 DIAGNOSIS — L259 Unspecified contact dermatitis, unspecified cause: Secondary | ICD-10-CM

## 2011-12-18 NOTE — Assessment & Plan Note (Signed)
Trial of low dose hydrocortisone cream. Continue zyrtec for itching.

## 2011-12-18 NOTE — Patient Instructions (Signed)
Please call if rash worsens or if no improvement in 1 week.

## 2011-12-18 NOTE — Progress Notes (Signed)
  Subjective:    Patient ID: Nancy Deleon, female    DOB: 21-Jul-1956, 56 y.o.   MRN: [id number]  HPI  [name] is a 56 yr old female who presents today with chief complaint of rash. Rash is located on her face.  Rash is associated with pruritis. Started about 1 week ago.  She reports previous hx of same.  She continues zyrtec with her allergies on a PRN basis.     Review of Systems    see HPI  Past Medical History  Diagnosis Date  . Hypertension     History   Social History  . Marital Status: Widowed    Spouse Name: N/A    Number of Children: 2  . Years of Education: N/A   Occupational History  .     Social History Main Topics  . Smoking status: Former Smoker    Quit date: 04/18/1985  . Smokeless tobacco: Never Used  . Alcohol Use: Yes     glass of wine with meals - sometimes  . Drug Use: No  . Sexually Active: Yes -- Female partner(s)    Birth Control/ Protection: Post-menopausal   Other Topics Concern  . 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 Tubal ligation   . Cesarean section [id number]  . Breast biopsy 04/24/11    rigth breast    Family History  Problem Relation Age of Onset  . Lymphoma Mother   . Cancer Mother     lymphoma  . Dementia Mother   . Cancer Father     lung  . Cancer Maternal Uncle     colon    Allergies  Allergen Reactions  . Naproxen     REACTION: vomiting  . Vicodin (Hydrocodone-Acetaminophen) Nausea And Vomiting  . Propoxyphene-Acetaminophen Nausea And Vomiting    Current Outpatient Prescriptions on File Prior to Visit  Medication Sig Dispense Refill  . amLODipine (NORVASC) 10 MG tablet Take 1 tablet (10 mg total) by mouth daily.  90 tablet  1  .  cetirizine (ZYRTEC) 10 MG tablet Take 10 mg by mouth daily.        BP 129/76  Pulse 69  Temp(Src) 98 F (36.7 C) (Oral)  Resp 12  Wt 225 lb (102.059 kg)  SpO2 99%  LMP 02/28/2007    Objective:   Physical Exam  Constitutional: She is oriented to person, place, and time. She appears well-developed and well-nourished. No distress.  HENT:  Head: Normocephalic and atraumatic.  Neurological: She is alert and oriented to person, place, and time.  Skin: Skin is warm and dry.       Fine, rough, raised rash on lower cheeks. No erythema or blisters noted.   Psychiatric: Her behavior is normal. Judgment and thought content normal.          Assessment & Plan:

## 2012-01-05 ENCOUNTER — Telehealth: Payer: Self-pay | Admitting: *Deleted

## 2012-01-05 DIAGNOSIS — R21 Rash and other nonspecific skin eruption: Secondary | ICD-10-CM

## 2012-01-05 NOTE — Telephone Encounter (Signed)
Pt left message stating she does not feel the cream is working as well as it should be. Still has rash. Wants to know if there is something else we can call in?

## 2012-01-05 NOTE — Telephone Encounter (Signed)
I would recommend that she see dermatology.  Referral pended below.

## 2012-01-05 NOTE — Telephone Encounter (Signed)
Notified pt and she is agreeable to proceed with referral. Advised pt to let us know if she doesn't hear from [place] within 1 week re: referral.

## 2012-02-21 ENCOUNTER — Encounter: Payer: Self-pay | Admitting: Family

## 2012-02-21 ENCOUNTER — Telehealth: Payer: Self-pay | Admitting: *Deleted

## 2012-02-21 ENCOUNTER — Ambulatory Visit (INDEPENDENT_AMBULATORY_CARE_PROVIDER_SITE_OTHER): Payer: BC Managed Care – PPO | Admitting: Family

## 2012-02-21 VITALS — BP 112/70 | HR 62 | Temp 97.8°F | Resp 16 | Ht 67.75 in | Wt 222.0 lb

## 2012-02-21 DIAGNOSIS — Z0000 Encounter for general adult medical examination without abnormal findings: Secondary | ICD-10-CM | POA: Insufficient documentation

## 2012-02-21 LAB — CBC WITH DIFFERENTIAL/PLATELET
Basophils Absolute: 0 10*3/uL (ref 0.0–0.1)
Eosinophils Relative: 6 % — ABNORMAL HIGH (ref 0–5)
HCT: 37.8 % (ref 36.0–46.0)
Hemoglobin: 13 g/dL (ref 12.0–15.0)
Lymphocytes Relative: 43 % (ref 12–46)
Lymphs Abs: 2 10*3/uL (ref 0.7–4.0)
MCV: 73.4 fL — ABNORMAL LOW (ref 78.0–100.0)
Monocytes Absolute: 0.3 10*3/uL (ref 0.1–1.0)
Neutro Abs: 2.1 10*3/uL (ref 1.7–7.7)
RBC: 5.15 MIL/uL — ABNORMAL HIGH (ref 3.87–5.11)
RDW: 14.2 % (ref 11.5–15.5)
WBC: 4.7 10*3/uL (ref 4.0–10.5)

## 2012-02-21 LAB — LIPID PANEL
Cholesterol: 168 mg/dL (ref 0–200)
HDL: 54 mg/dL (ref >39)
Triglycerides: 63 mg/dL (ref <150)
VLDL: 13 mg/dL (ref 0–40)

## 2012-02-21 LAB — HEPATIC FUNCTION PANEL
ALT: 11 U/L (ref 0–35)
Bilirubin, Direct: 0.1 mg/dL (ref 0.0–0.3)
Indirect Bilirubin: 0.5 mg/dL (ref 0.0–0.9)
Total Bilirubin: 0.6 mg/dL (ref 0.3–1.2)

## 2012-02-21 NOTE — Telephone Encounter (Signed)
Lab was unable to obtain urine sample today as pt was unable to void. Test has been cancelled from today's visit.

## 2012-02-21 NOTE — Patient Instructions (Addendum)
Please complete your lab work prior to leaving.  Schedule bone density at the front desk. Continue your hard work on diet, exercise and weight loss. Follow up in 6 months. Have a great summer!

## 2012-02-21 NOTE — Progress Notes (Signed)
Subjective:    Patient ID: Nancy Deleon, female    DOB: [birth year], 56 y.o.   MRN: [id number]  HPI  Nancy Deleon is a 56 yr old female who presents today for CPX.  Pt here for fasting physical. Pt is up to date with mammogram (11/21/11), colonosocpy (2011) and tetanus. Last gyn exam 02/2011. Never had DEXA.  Walking most nights.    Review of Systems  Constitutional: Negative for unexpected weight change.  HENT: Negative for hearing loss and congestion.   Eyes: Negative for visual disturbance.  Respiratory: Negative for choking.   Cardiovascular:       Notes some swelling in her ankles.    Gastrointestinal: Negative for vomiting, diarrhea and constipation.  Genitourinary: Negative for dysuria and frequency.  Musculoskeletal:       Notes some calf soreness bilaterally.    Skin:       Facial rash is improved.  Neurological: Negative for headaches.  Hematological: Negative for adenopathy.  Psychiatric/Behavioral:       Denies depression/anxiety       Past Medical History  Diagnosis Date  . Hypertension     History   Social History  . Marital Status: Widowed    Spouse Name: N/A    Number of Children: 2  . Years of Education: N/A   Occupational History  .     Social History Main Topics  . Smoking status: Former Smoker    Quit date: 04/18/1985  . Smokeless tobacco: Never Used  . Alcohol Use: Yes     glass of wine with meals - sometimes  . Drug Use: No  . Sexually Active: Yes -- Female partner(s)    Birth Control/ Protection: Post-menopausal   Other Topics Concern  . 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 Tubal ligation   . Cesarean section [id number]  . Breast biopsy 04/24/11    rigth breast     Family History  Problem Relation Age of Onset  . Lymphoma Mother   . Cancer Mother     lymphoma  . Dementia Mother   . Cancer Father     lung  . Cancer Maternal Uncle     colon    Allergies  Allergen Reactions  . Naproxen     REACTION: vomiting  . Vicodin (Hydrocodone-Acetaminophen) Nausea And Vomiting  . Propoxyphene-Acetaminophen Nausea And Vomiting    Current Outpatient Prescriptions on File Prior to Visit  Medication Sig Dispense Refill  . amLODipine (NORVASC) 10 MG tablet Take 1 tablet (10 mg total) by mouth daily.  90 tablet  1  . cetirizine (ZYRTEC) 10 MG tablet Take 10 mg by mouth daily.        BP 112/70  Pulse 62  Temp 97.8 F (36.6 C) (Oral)  Resp 16  Ht 5' 7.75" (1.721 m)  Wt 222 lb 0.6 oz (100.717 kg)  BMI 34.01 kg/m2  SpO2 99%  LMP 02/28/2007    Objective:   Physical Exam  Physical Exam  Constitutional: She is oriented to person, place, and time. She appears well-developed and well-nourished. No distress.  HENT:  Head: Normocephalic and atraumatic.  Right Ear: Tympanic membrane and ear canal normal.  Left Ear: Tympanic membrane  and ear canal normal.  Mouth/Throat: Oropharynx is clear and moist.  Eyes: Pupils are equal, round, and reactive to light. No scleral icterus.  Neck: Normal range of motion. No thyromegaly present.  Cardiovascular: Normal rate and regular rhythm.   No murmur heard. Pulmonary/Chest: Effort normal and breath sounds normal. No respiratory distress. He has no wheezes. She has no rales. She exhibits no tenderness.  Abdominal: Soft. Bowel sounds are normal. He exhibits no distension and no mass. There is no tenderness. There is no rebound and no guarding.  Musculoskeletal: She exhibits no edema.  Lymphadenopathy:    She has no cervical adenopathy.  Neurological: She is alert and oriented to person, place, and time. She exhibits normal muscle tone. Coordination normal.  Skin: Skin is warm and dry.  Psychiatric: She has a  normal mood and affect. Her behavior is normal. Judgment and thought content normal.  Breast/pelvic- deferred to GYN        Assessment & Plan:          Assessment & Plan:

## 2012-02-21 NOTE — Assessment & Plan Note (Signed)
Pt counseled on diet/exercise weight loss.  Refer for dexa.  Immunizations up to date. Obtain fasting labs.

## 2012-02-22 ENCOUNTER — Ambulatory Visit (INDEPENDENT_AMBULATORY_CARE_PROVIDER_SITE_OTHER)
Admission: RE | Admit: 2012-02-22 | Discharge: 2012-02-22 | Disposition: A | Discharge status: Home / Self Care | Payer: BC Managed Care – PPO | Source: Ambulatory Visit

## 2012-02-22 DIAGNOSIS — Z0000 Encounter for general adult medical examination without abnormal findings: Secondary | ICD-10-CM

## 2012-02-22 LAB — BASIC METABOLIC PANEL WITH GFR
BUN: 14 mg/dL (ref 6–23)
CO2: 26 mEq/L (ref 19–32)
Calcium: 9.3 mg/dL (ref 8.4–10.5)
Chloride: 108 mEq/L (ref 96–112)
Creat: 0.8 mg/dL (ref 0.50–1.10)
GFR, Est Non African American: 83 mL/min

## 2012-02-26 ENCOUNTER — Encounter: Payer: Self-pay | Admitting: Family

## 2012-04-10 ENCOUNTER — Telehealth: Payer: Self-pay | Admitting: Family

## 2012-04-10 NOTE — Telephone Encounter (Signed)
pls call pt and let her know that her bone density test is normal for her age.  I would like for her to add caltrate + D 600mg  bid to help maintain her bones.

## 2012-04-11 NOTE — Telephone Encounter (Signed)
Left detailed message on cell re: instructions below and to call if any questions. 

## 2012-05-29 ENCOUNTER — Ambulatory Visit (INDEPENDENT_AMBULATORY_CARE_PROVIDER_SITE_OTHER): Payer: BC Managed Care – PPO | Admitting: Family

## 2012-05-29 ENCOUNTER — Encounter: Payer: Self-pay | Admitting: Family

## 2012-05-29 VITALS — BP 117/78 | HR 69 | Temp 98.4°F | Resp 12 | Wt 226.0 lb

## 2012-05-29 DIAGNOSIS — M5412 Radiculopathy, cervical region: Secondary | ICD-10-CM | POA: Insufficient documentation

## 2012-05-29 MED ORDER — METHYLPREDNISOLONE (PAK) 4 MG PO TABS: ORAL_TABLET | ORAL | Status: DC

## 2012-05-29 NOTE — Assessment & Plan Note (Signed)
Review of old records reveals C spine film 2002 noting multilevel DDD of the C spine.  Will give trial of medrol dose pak.  If no improvement, or if symptoms consider MRI C spine.

## 2012-05-29 NOTE — Progress Notes (Signed)
Subjective:    Patient ID: Nancy Deleon, female    DOB: 1956/04/16, 56 y.o.   MRN: [id number]  HPI  [name] is a 56 yr old female who presents today with chief complaint of tingling sensation located in the left neck for 1 week. Pain radiates into the left shoulder.  Pain is intermittent.  But can be constant at times. Has tried advil without significant improvement.   Review of Systems    see HPI  Past Medical History  Diagnosis Date  . Hypertension     History   Social History  . Marital Status: Widowed    Spouse Name: N/A    Number of Children: 2  . Years of Education: N/A   Occupational History  .     Social History Main Topics  . Smoking status: Former Smoker    Quit date: 04/18/1985  . Smokeless tobacco: Never Used  . Alcohol Use: Yes     glass of wine with meals - sometimes  . Drug Use: No  . Sexually Active: Yes -- Female partner(s)    Birth Control/ Protection: Post-menopausal   Other Topics Concern  . 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 Tubal ligation   . Cesarean section [id number]  . Breast biopsy 04/24/11    rigth breast    Family History  Problem Relation Age of Onset  . Lymphoma Mother   . Cancer Mother     lymphoma  . Dementia Mother   . Cancer Father     lung  . Cancer Maternal Uncle     colon    Allergies  Allergen Reactions  . Naproxen     REACTION: vomiting  . Vicodin (Hydrocodone-Acetaminophen) Nausea And Vomiting  . Propoxyphene-Acetaminophen Nausea And Vomiting    Current Outpatient Prescriptions on File Prior to Visit  Medication Sig Dispense Refill  . amLODipine (NORVASC) 10 MG tablet Take 1 tablet (10 mg total) by mouth daily.  90 tablet  1  .  Calcium Carbonate-Vitamin D (CALTRATE 600+D) 600-400 MG-UNIT per tablet Take 1 tablet by mouth 2 (two) times daily.      . cetirizine (ZYRTEC) 10 MG tablet Take 10 mg by mouth daily.        BP 117/78  Pulse 69  Temp 98.4 F (36.9 C) (Oral)  Resp 12  Wt 226 lb 0.6 oz (102.531 kg)  SpO2 99%  LMP 02/28/2007    Objective:   Physical Exam  Constitutional: She is oriented to person, place, and time. She appears well-developed and well-nourished. No distress.  HENT:  Head: Normocephalic and atraumatic.  Cardiovascular: Normal rate and regular rhythm.   No murmur heard. Pulmonary/Chest: Effort normal and breath sounds normal. No respiratory distress. She has no wheezes. She has no rales. She exhibits no tenderness.  Musculoskeletal: She exhibits no edema.  Neurological: She is oriented to person, place, and time.       Strong equal bilateral hand grasps.  Bilateral UE strength is 5/5.  Skin: Skin is warm and dry.  Psychiatric: She has a normal mood and affect. Her behavior is normal. Judgment and thought content normal.          Assessment & Plan:  Pt notes recent increased stress in caring for her elderly mother who suffers from dementia.  Support provided.

## 2012-05-29 NOTE — Patient Instructions (Addendum)
Call if symptoms worsen or if no improvement in 1 week.  

## 2012-06-18 ENCOUNTER — Telehealth: Payer: Self-pay | Admitting: *Deleted

## 2012-06-18 DIAGNOSIS — M542 Cervicalgia: Secondary | ICD-10-CM

## 2012-06-18 NOTE — Telephone Encounter (Signed)
Received call from pt stating she completed medrol dosepack without relief of numbness/tingling in her neck. Reports symptoms are the same, no worse, no better.  Pt states to try her cell # or work # tomorrow [phone]). Please advise.

## 2012-06-19 MED ORDER — CYCLOBENZAPRINE HCL 5 MG PO TABS: 5.0000 mg | ORAL_TABLET | Freq: Every evening | ORAL | Status: DC | PRN

## 2012-06-19 NOTE — Telephone Encounter (Signed)
Attempted to reach pt at number below.  - busy signal.

## 2012-06-19 NOTE — Telephone Encounter (Addendum)
Left shoulder pain/tingling.  Intermittent.  Will rx with flexeril to be used HS prn- refer for mri c spine.  Pt is agreeable.

## 2012-06-22 ENCOUNTER — Ambulatory Visit (HOSPITAL_BASED_OUTPATIENT_CLINIC_OR_DEPARTMENT_OTHER)
Admission: RE | Admit: 2012-06-22 | Discharge: 2012-06-22 | Disposition: A | Discharge status: Home / Self Care | Payer: BC Managed Care – PPO | Source: Ambulatory Visit | Attending: Family | Admitting: Family

## 2012-06-22 DIAGNOSIS — M542 Cervicalgia: Secondary | ICD-10-CM

## 2012-06-22 DIAGNOSIS — M47812 Spondylosis without myelopathy or radiculopathy, cervical region: Secondary | ICD-10-CM | POA: Insufficient documentation

## 2012-06-23 NOTE — Telephone Encounter (Addendum)
Pls call pt and let her know that  Reviewed her MRI of the neck.  It notes arthritis and bad degenerative discs.  Some possible pinched nerves.  These findings explain her pain.  I would like to refer her to Neurosurgery to further evaluate her.

## 2012-06-23 NOTE — Addendum Note (Signed)
Addended by: Sandford Craze on: 06/23/2012 08:34 PM   Modules accepted: Orders

## 2012-06-24 NOTE — Telephone Encounter (Signed)
Left detailed message on cell# re: instructions and to call and let us know if she wants to proceed with referral.

## 2012-06-25 MED ORDER — TRAMADOL HCL 50 MG PO TABS: 50.0000 mg | ORAL_TABLET | Freq: Three times a day (TID) | ORAL | Status: DC | PRN

## 2012-06-25 NOTE — Telephone Encounter (Addendum)
She can try tramadol (may cause drowsiness though so she should not drive after taking). This is not as strong as hydrocodone (I see she has had N/V with hydrocodone). It is possible that she could have nausea with tramadol, but worth a try- let me know if she has problems.   For less severe pain, she can try tylenol.

## 2012-06-25 NOTE — Addendum Note (Signed)
Addended by: Sandford Craze on: 06/25/2012 11:53 AM   Modules accepted: Orders

## 2012-06-25 NOTE — Telephone Encounter (Signed)
Pt called back stating she wants to proceed with neurosurgeon referral but wants to know what she can take for her pain. Doesn't feel like the prednisone dose pack helped and states the muscle relaxer makes her feel loopy.  Please advise.

## 2012-06-25 NOTE — Telephone Encounter (Signed)
Left detailed message on cell and to call if any questions. 

## 2012-08-26 ENCOUNTER — Encounter: Payer: Self-pay | Admitting: Family

## 2012-08-26 ENCOUNTER — Ambulatory Visit (INDEPENDENT_AMBULATORY_CARE_PROVIDER_SITE_OTHER): Payer: BC Managed Care – PPO | Admitting: Family

## 2012-08-26 ENCOUNTER — Telehealth: Payer: Self-pay | Admitting: *Deleted

## 2012-08-26 VITALS — BP 124/74 | HR 69 | Temp 98.8°F | Resp 16 | Ht 67.75 in | Wt 227.1 lb

## 2012-08-26 DIAGNOSIS — M5412 Radiculopathy, cervical region: Secondary | ICD-10-CM

## 2012-08-26 DIAGNOSIS — R7301 Impaired fasting glucose: Secondary | ICD-10-CM

## 2012-08-26 DIAGNOSIS — I10 Essential (primary) hypertension: Secondary | ICD-10-CM

## 2012-08-26 LAB — BASIC METABOLIC PANEL
CO2: 27 mEq/L (ref 19–32)
Chloride: 106 mEq/L (ref 96–112)
Potassium: 4.3 mEq/L (ref 3.5–5.3)

## 2012-08-26 LAB — HEMOGLOBIN A1C: Mean Plasma Glucose: 126 mg/dL — ABNORMAL HIGH (ref <117)

## 2012-08-26 MED ORDER — AMLODIPINE BESYLATE 10 MG PO TABS: 10.0000 mg | ORAL_TABLET | Freq: Every day | ORAL | Status: DC

## 2012-08-26 NOTE — Assessment & Plan Note (Signed)
Resolved

## 2012-08-26 NOTE — Progress Notes (Signed)
  Subjective:    Patient ID: Nancy Deleon, female    DOB: 1956/04/20, 57 y.o.   MRN: [id number]  HPI  [name] is a 57 yr old female who presents today for follow up.  HTN-Nancy Deleon is currently maintained on amlodipine.  Denies CP/SOB or swelling.   Hyperglycemia- last A1C was mildly elevated.  Neck pain- resolved per patient.   Review of Systems    see HPI  Past Medical History  Diagnosis Date  . Hypertension     History   Social History  . Marital Status: Widowed    Spouse Name: N/A    Number of Children: 2  . Years of Education: N/A   Occupational History  .     Social History Main Topics  . Smoking status: Former Smoker    Quit date: 04/18/1985  . Smokeless tobacco: Never Used  . Alcohol Use: Yes     Comment: glass of wine with meals - sometimes  . Drug Use: No  . Sexually Active: Yes -- Female partner(s)    Birth Control/ Protection: Post-menopausal   Other Topics Concern  . 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 Tubal ligation   . Cesarean section [id number]  . Breast biopsy 04/24/11    rigth breast    Family History  Problem Relation Age of Onset  . Lymphoma Mother   . Cancer Mother     lymphoma  . Dementia Mother   . Cancer Father     lung  . Cancer Maternal Uncle     colon    Allergies  Allergen Reactions  . Naproxen     REACTION: vomiting  . Vicodin (Hydrocodone-Acetaminophen) Nausea And Vomiting  . Propoxyphene-Acetaminophen Nausea And Vomiting    Current Outpatient Prescriptions on File Prior to Visit  Medication Sig Dispense Refill  . amLODipine (NORVASC) 10 MG tablet Take 1 tablet (10 mg total) by mouth daily.  90 tablet  1  . Calcium Carbonate-Vitamin D  (CALTRATE 600+D) 600-400 MG-UNIT per tablet Take 1 tablet by mouth 2 (two) times daily.      . cetirizine (ZYRTEC) 10 MG tablet Take 10 mg by mouth daily.        BP 124/74  Pulse 69  Temp 98.8 F (37.1 C) (Oral)  Resp 16  Ht 5' 7.75" (1.721 m)  Wt 227 lb 1.9 oz (103.021 kg)  BMI 34.79 kg/m2  SpO2 97%  LMP 02/28/2007    Objective:   Physical Exam  Constitutional: Nancy Deleon is oriented to person, place, and time. Nancy Deleon appears well-developed and well-nourished. No distress.  Cardiovascular: Normal rate and regular rhythm.   No murmur heard. Pulmonary/Chest: Effort normal and breath sounds normal. No respiratory distress. Nancy Deleon has no wheezes. Nancy Deleon has no rales. Nancy Deleon exhibits no tenderness.  Neurological: Nancy Deleon is alert and oriented to person, place, and time.  Psychiatric: Nancy Deleon has a normal mood and affect. Her behavior is normal. Judgment and thought content normal.          Assessment & Plan:

## 2012-08-26 NOTE — Assessment & Plan Note (Addendum)
BP appears stable on amlodipine.  Obtain bmet.

## 2012-08-26 NOTE — Addendum Note (Signed)
Addended by: Regis Bill on: 08/26/2012 10:48 AM   Modules accepted: Orders

## 2012-08-26 NOTE — Assessment & Plan Note (Signed)
Obtain A1C. 

## 2012-08-26 NOTE — Telephone Encounter (Signed)
Pt called back requesting Rx be sent to [facility] mail order service. Rx faxed from [facility].

## 2012-08-26 NOTE — Patient Instructions (Addendum)
Please complete your lab work prior to leaving.  Please follow up in 6 months for a fasting physical.

## 2012-08-26 NOTE — Telephone Encounter (Signed)
Left detailed message for pt to call with name of mail order pharmacy that Rx needs to be faxed to.

## 2012-12-06 ENCOUNTER — Other Ambulatory Visit: Payer: Self-pay | Admitting: Family

## 2012-12-06 DIAGNOSIS — Z1231 Encounter for screening mammogram for malignant neoplasm of breast: Secondary | ICD-10-CM

## 2012-12-11 ENCOUNTER — Ambulatory Visit (HOSPITAL_COMMUNITY)
Admission: RE | Admit: 2012-12-11 | Discharge: 2012-12-11 | Disposition: A | Discharge status: Home / Self Care | Payer: BC Managed Care – PPO | Source: Ambulatory Visit | Attending: Family | Admitting: Family

## 2012-12-11 DIAGNOSIS — Z1231 Encounter for screening mammogram for malignant neoplasm of breast: Secondary | ICD-10-CM

## 2013-01-06 ENCOUNTER — Encounter: Payer: Self-pay | Admitting: Gynecology

## 2013-01-08 ENCOUNTER — Encounter: Payer: Self-pay | Admitting: Gynecology

## 2013-01-08 ENCOUNTER — Ambulatory Visit (INDEPENDENT_AMBULATORY_CARE_PROVIDER_SITE_OTHER): Payer: BC Managed Care – PPO | Admitting: Gynecology

## 2013-01-08 VITALS — BP 132/80 | Ht 66.5 in | Wt 228.0 lb

## 2013-01-08 DIAGNOSIS — N952 Postmenopausal atrophic vaginitis: Secondary | ICD-10-CM

## 2013-01-08 DIAGNOSIS — Z01419 Encounter for gynecological examination (general) (routine) without abnormal findings: Secondary | ICD-10-CM

## 2013-01-08 DIAGNOSIS — N951 Menopausal and female climacteric states: Secondary | ICD-10-CM

## 2013-01-08 NOTE — Progress Notes (Signed)
HAVAH AMMON [birth year] [id number]        57 y.o.  G1P1 for annual exam.  Several issues noted below.  Past medical history,surgical history, medications, allergies, family history and social history were all reviewed and documented in the [facility] chart.  ROS:  Performed and pertinent positives and negatives are included in the history, assessment and plan .  Exam: Sherrilyn Rist assistant Filed Vitals:   01/08/13 1002  BP: 132/80  Height: 5' 6.5" (1.689 m)  Weight: 228 lb (103.42 kg)   General appearance  Normal Skin grossly normal Head/Neck normal with no cervical or supraclavicular adenopathy thyroid normal Lungs  clear Cardiac RR, without RMG Abdominal  soft, nontender, without masses, organomegaly or hernia Breasts  examined lying and sitting without masses, retractions, discharge or axillary adenopathy. Pelvic  Ext/BUS/vagina  normal with atrophic changes  Cervix  normal with atrophic changes  Uterus  axial to anteverted, normal size, shape and contour, midline and mobile nontender   Adnexa  Without masses or tenderness    Anus and perineum  normal   Rectovaginal  normal sphincter tone without palpated masses or tenderness.    Assessment/Plan:  57 y.o. G1P1 female for annual exam.   1. Postmenopausal. Patient having some hot flushes primarily in the face. No night sweats, vaginal dryness or dyspareunia. No vaginal bleeding Recommended OTC trial of soy. Discussed possible HRT. If symptoms persist or worsen she'll return to discuss HRT in depth. Patient knows to report any vaginal bleeding. 2. Pap smear 2012. The Pap smear done today. No history of abnormal Pap smears previously. Plan repeat Pap smear next year at 3 year interval. 3. Mammography May 2014. Continue with annual mammography. SBE monthly reviewed. 4. Colonoscopy 2011. Repeat at their recommended interval. 5. DEXA July 2013 normal. Increase calcium vitamin D reviewed. Recommended to have vitamin D level checked with her  next blood draw at her primary physician's office. 6. Health maintenance. No blood work done as it is all done through her primary physician's office who she sees on a regular basis. Followup one year, sooner as needed.    Dara Lords MD, 10:53 AM 01/08/2013

## 2013-01-08 NOTE — Patient Instructions (Signed)
Followup in one year for annual exam. Sooner if hot flashes worsen and you want to discuss hormone replacement.

## 2013-01-09 LAB — URINALYSIS W MICROSCOPIC + REFLEX CULTURE
Casts: NONE SEEN
Crystals: NONE SEEN
Hgb urine dipstick: NEGATIVE
Leukocytes, UA: NEGATIVE
Nitrite: NEGATIVE
Specific Gravity, Urine: 1.018 (ref 1.005–1.030)
pH: 6 (ref 5.0–8.0)

## 2013-01-30 ENCOUNTER — Telehealth: Payer: Self-pay | Admitting: *Deleted

## 2013-01-30 MED ORDER — AMLODIPINE BESYLATE 10 MG PO TABS: 10.0000 mg | ORAL_TABLET | Freq: Every day | ORAL | Status: DC

## 2013-01-30 NOTE — Telephone Encounter (Signed)
Received fax from [facility] for amlodipine refill. Approval faxed to [phone] at 9:30am.

## 2013-02-19 ENCOUNTER — Ambulatory Visit (INDEPENDENT_AMBULATORY_CARE_PROVIDER_SITE_OTHER): Payer: BC Managed Care – PPO | Admitting: Family

## 2013-02-19 ENCOUNTER — Encounter: Payer: Self-pay | Admitting: Family

## 2013-02-19 VITALS — BP 100/78 | HR 70 | Temp 98.6°F | Resp 16 | Ht 67.5 in | Wt 228.1 lb

## 2013-02-19 DIAGNOSIS — R071 Chest pain on breathing: Secondary | ICD-10-CM

## 2013-02-19 DIAGNOSIS — E669 Obesity, unspecified: Secondary | ICD-10-CM

## 2013-02-19 DIAGNOSIS — R0789 Other chest pain: Secondary | ICD-10-CM

## 2013-02-19 DIAGNOSIS — Z0000 Encounter for general adult medical examination without abnormal findings: Secondary | ICD-10-CM

## 2013-02-19 LAB — CBC WITH DIFFERENTIAL/PLATELET
Eosinophils Absolute: 0.3 10*3/uL (ref 0.0–0.7)
Eosinophils Relative: 5 % (ref 0–5)
Lymphs Abs: 2.1 10*3/uL (ref 0.7–4.0)
MCH: 25 pg — ABNORMAL LOW (ref 26.0–34.0)
MCV: 72.6 fL — ABNORMAL LOW (ref 78.0–100.0)
Monocytes Relative: 7 % (ref 3–12)
Platelets: 147 10*3/uL — ABNORMAL LOW (ref 150–400)
RBC: 5 MIL/uL (ref 3.87–5.11)

## 2013-02-19 NOTE — Progress Notes (Signed)
Subjective:    Patient ID: Nancy Deleon, female    DOB: 12-Dec-1955, 57 y.o.   MRN: [id number]  HPI  Nancy Deleon is a 57 yr old female who presents today for cpx.  Patient presents today for complete physical.  Immunizations: tetanus up to date Diet: requests referral to nutritionist. Exercise: swimming daily Colonoscopy: up to date Dexa: up to date Pap Smear: per gyn (Dr. Audie Box) Mammogram: up to date  Chest Pain Pt was seen in urgent Care Friday for chest tightness. Was prescribed tramadol, omeprazole and cyclobenzaprine. No longer taking these meds and symptoms have resolved. Reports that tramadol "made me sick."  She was also given omeprazole.  Reports that she had anterior chest wall tenderness which is now resolved.       Review of Systems  Constitutional: Negative for fever.  HENT: Negative for congestion.   Respiratory: Negative for cough.   Cardiovascular: Negative for chest pain.       Occasional left ankle swelling following old injury  Gastrointestinal: Positive for constipation. Negative for nausea, vomiting and diarrhea.  Genitourinary: Negative for dysuria.  Musculoskeletal: Negative for back pain.  Skin: Negative for rash.  Neurological: Negative for headaches.  Hematological: Negative for adenopathy.  Psychiatric/Behavioral:       Denies depression/anxiety   Past Medical History  Diagnosis Date  . Hypertension     History   Social History  . Marital Status: Widowed    Spouse Name: N/A    Number of Children: 2  . Years of Education: N/A   Occupational History  .     Social History Main Topics  . Smoking status: Former Smoker    Quit date: 04/18/1985  . Smokeless tobacco: Never Used  . Alcohol Use: Yes     Comment: glass of wine with meals - sometimes  . Drug Use: No  . Sexually Active: Yes -- Female partner(s)    Birth Control/ Protection: Post-menopausal   Other Topics Concern  . Not on file   Social History Narrative   Regular  exercise:  Walks 2-5 x weekly   Caffeine Use: no   Widowed   She has a son Nancy Deleon- he is 75 yrs old.   Foster parent.   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   Dog (Lab named [facility])             Past Surgical History  Procedure Laterality Date  . Tubal ligation    . Cesarean section  [id number]  . Breast biopsy  04/24/11    rigth breast    Family History  Problem Relation Age of Onset  . Lymphoma Mother   . Cancer Mother     lymphoma  . Dementia Mother   . Cancer Father     lung  . Cancer Maternal Uncle     colon    Allergies  Allergen Reactions  . Naproxen     REACTION: vomiting  . Vicodin (Hydrocodone-Acetaminophen) Nausea And Vomiting  . Propoxyphene-Acetaminophen Nausea And Vomiting    Current Outpatient Prescriptions on File Prior to Visit  Medication Sig Dispense Refill  . amLODipine (NORVASC) 10 MG tablet Take 1 tablet (10 mg total) by mouth daily.  90 tablet  1  . Calcium Carbonate-Vitamin D (CALTRATE 600+D) 600-400 MG-UNIT per tablet Take 1 tablet by mouth 2 (two) times daily.      . cetirizine (ZYRTEC) 10 MG tablet Take  10 mg by mouth daily.       No current facility-administered medications on file prior to visit.    BP 100/78  Pulse 70  Temp(Src) 98.6 F (37 C) (Oral)  Resp 16  Ht 5' 7.5" (1.715 m)  Wt 228 lb 1.3 oz (103.456 kg)  BMI 35.17 kg/m2  SpO2 97%  LMP 02/28/2007       Objective:   Physical Exam  Physical Exam  Constitutional: She is oriented to person, place, and time. She appears well-developed and well-nourished. No distress.  HENT:  Head: Normocephalic and atraumatic.  Right Ear: Tympanic membrane and ear canal normal.  Left Ear: Tympanic membrane and ear canal normal.  Mouth/Throat: Oropharynx is clear and moist.  Eyes: Pupils are equal, round, and reactive to light. No scleral icterus.  Neck: Normal range of motion. No thyromegaly present.  Cardiovascular: Normal  rate and regular rhythm.   No murmur heard. Pulmonary/Chest: Effort normal and breath sounds normal. No respiratory distress. He has no wheezes. She has no rales. She exhibits no tenderness.  Abdominal: Soft. Bowel sounds are normal. He exhibits no distension and no mass. There is no tenderness. There is no rebound and no guarding.  Musculoskeletal: She exhibits no edema.  Lymphadenopathy:    She has no cervical adenopathy.  Neurological: She is alert and oriented to person, place, and time. She has normal reflexes. She exhibits normal muscle tone. Coordination normal.  Skin: Skin is warm and dry.  Psychiatric: She has a normal mood and affect. Her behavior is normal. Judgment and thought content normal.  Breasts: Examined lying Right: Without masses, retractions, discharge or axillary adenopathy.  Left: Without masses, retractions, discharge or axillary adenopathy.  Pelvic: deferred to GYN      Assessment & Plan:         Assessment & Plan:

## 2013-02-19 NOTE — Assessment & Plan Note (Signed)
Obtain fasting lab work.  Continue exercise, refer to nutritionist for help with weight loss.

## 2013-02-19 NOTE — Patient Instructions (Addendum)
Please complete lab work prior to leaving.  Follow up in 6 months.  

## 2013-02-19 NOTE — Assessment & Plan Note (Signed)
S/p eval in urgent care- now resolved. Will request records including ekg.

## 2013-02-20 ENCOUNTER — Encounter: Payer: Self-pay | Admitting: Family

## 2013-02-20 LAB — BASIC METABOLIC PANEL WITH GFR
BUN: 13 mg/dL (ref 6–23)
CO2: 31 mEq/L (ref 19–32)
Calcium: 9.3 mg/dL (ref 8.4–10.5)
Glucose, Bld: 127 mg/dL — ABNORMAL HIGH (ref 70–99)
Sodium: 138 mEq/L (ref 135–145)

## 2013-02-20 LAB — URINALYSIS, ROUTINE W REFLEX MICROSCOPIC
Leukocytes, UA: NEGATIVE
Nitrite: NEGATIVE
Specific Gravity, Urine: 1.018 (ref 1.005–1.030)
pH: 7 (ref 5.0–8.0)

## 2013-02-20 LAB — TSH: TSH: 0.895 u[IU]/mL (ref 0.350–4.500)

## 2013-02-20 LAB — HEPATIC FUNCTION PANEL
AST: 28 U/L (ref 0–37)
Albumin: 4.4 g/dL (ref 3.5–5.2)
Alkaline Phosphatase: 67 U/L (ref 39–117)
Total Protein: 7 g/dL (ref 6.0–8.3)

## 2013-03-03 ENCOUNTER — Telehealth: Payer: Self-pay | Admitting: *Deleted

## 2013-03-03 NOTE — Telephone Encounter (Signed)
Received message from pt requesting results from CPE on 02/19/13. Left detailed message on pt's cell# that results were mailed to her last week and left verbal notifying her of the results per 02/20/13 lab letter and to call if any questions

## 2013-03-03 NOTE — Telephone Encounter (Signed)
Pt left another message that she was asking about status of nutrition referral. Spoke with referrral coordinator and she states that nutrition scheduler is out of the office until tomorrow. They will contact pt once appt has been arranged. Pt voices understanding.

## 2013-03-04 ENCOUNTER — Encounter: Payer: Self-pay | Admitting: Family

## 2013-03-24 ENCOUNTER — Telehealth: Payer: Self-pay | Admitting: Family

## 2013-03-24 ENCOUNTER — Encounter: Payer: BC Managed Care – PPO | Attending: Family | Admitting: Dietician

## 2013-03-24 ENCOUNTER — Encounter: Payer: Self-pay | Admitting: Dietician

## 2013-03-24 VITALS — Ht 67.0 in | Wt 229.1 lb

## 2013-03-24 DIAGNOSIS — Z713 Dietary counseling and surveillance: Secondary | ICD-10-CM | POA: Insufficient documentation

## 2013-03-24 DIAGNOSIS — E669 Obesity, unspecified: Secondary | ICD-10-CM

## 2013-03-24 NOTE — Progress Notes (Signed)
  Medical Nutrition Therapy:  Appt start time: 1130 end time:  1230.   Assessment:  Primary concerns today: Doctor recommended that she lose weight and get blood sugar lower since she in in the pre-diabetes range.   Nancy Deleon works for [employer] and lives with her daughter and states that they both do the grocery shopping and she mostly does the food preparation. Eats out 3 x week. States she is sometimes eating meals in the living room. Drinks a lot of sugar-sweetened beverages each day.   Used to exercise more often, not exercising as much lately. Has previously counted calories for weight control and did not like it.   MEDICATIONS: see list   DIETARY INTAKE:   Avoided foods include red beets, doesn't eat a lot of fruit because sometime upset stomach.    24-hr recall:  B ( AM): bowl of cereal (raisin bran, cheeros, fruit loops) with 2% and cup coffee with equal with creamer will skip sometimes on weekends Snk ( AM): none  L ( PM): sandwich such as [place], bologna, or ham, or chips, or pizza, or salad with veggies or leftovers with coke, sprint, mountain dew or water Snk ( PM): cookies D ( PM): pasta, tacos, pizza, grill or go out to [employer], [employer], [employer] mostly sweet tea Snk ( PM): bowl of ice cream rarely  Beverages: water  Usual physical activity: swim 2 hours few times per week, walk once a week  Estimated energy needs: 1600 calories 180 g carbohydrates 120 g protein 44 g fat  Progress Towards Goal(s):  In progress.   Nutritional Diagnosis:  NB-1.1 Food and nutrition-related knowledge deficit As related to energy dense food choices, large portions, and excess sugar-sweetened beverage intake.  As evidenced by elevated fasting blood glucose and BMI of 35.9.    Intervention:  Nutrition counseling provided. Recommended working with daughter so they both can make healthy lifestyle choices together. Discussed watching portion sizes, limiting sugar snacks and beverages,  eating snacks to prevent later hunger, filling half of plate with vegetables. Explained the role of physical activity and glucose control and weight control.   Plan: Eat three meals per day and 2-3 snacks. Eat every 3-5 hour that you are awake. Have a small breakfast (snack size) and then cereal and skim milk mid morning. Have snacks with protein.  Look at food label to see portion size of cereal. Choose cereal with less than 10 gram of sugar and 4 grams or more of fiber. Fill your lunch and dinner plates with vegetables, limit starch to a quarter of the plate. Save half of restaurant meals for second meal.  Eat meals at the table with no TV.  Aim for 30 minutes of exercise 5 x week.   Handouts given during visit include:  MyPlate handout  16X CHO snacks  Food label  Monitoring/Evaluation:  Dietary intake, exercise, and body weight in 6 week(s).

## 2013-03-24 NOTE — Telephone Encounter (Signed)
Received medical records from [facility]   P: [phone] F: [phone]

## 2013-03-24 NOTE — Patient Instructions (Addendum)
Eat three meals per day and 2-3 snacks. Eat every 3-5 hour that you are awake. Have a small breakfast (snack size) and then cereal and skim milk mid morning. Have snacks with protein.  Look at food label to see portion size of cereal. Choose cereal with less than 10 gram of sugar and 4 grams or more of fiber. Fill your lunch and dinner plates with vegetables, limit starch to a quarter of the plate. Save half of restaurant meals for second meal.  Eat meals at the table with no TV.  Aim for 30 minutes of exercise 5 x week.

## 2013-04-21 ENCOUNTER — Encounter: Payer: BC Managed Care – PPO | Attending: Family | Admitting: Dietician

## 2013-04-21 VITALS — Ht 67.0 in | Wt 226.4 lb

## 2013-04-21 DIAGNOSIS — E669 Obesity, unspecified: Secondary | ICD-10-CM | POA: Insufficient documentation

## 2013-04-21 DIAGNOSIS — Z713 Dietary counseling and surveillance: Secondary | ICD-10-CM | POA: Insufficient documentation

## 2013-04-21 NOTE — Patient Instructions (Addendum)
Continue to eat three meals per day and 2-3 snacks. Eat every 3-5 hour that you are awake.  Have snacks with protein.  Eat meals slowly (20 minutes) at the table with no TV.  Continue fill your lunch and dinner plates with vegetables, limit starch to a quarter of the plate. Save half of restaurant meals for second meal.  Aim for 30 minutes of exercise 5 x week.

## 2013-04-21 NOTE — Progress Notes (Signed)
  Medical Nutrition Therapy:  Appt start time: 1130 end time:  1230.   Assessment:  Primary concerns today: Nancy Deleon is here today for a follow up for pre-diabetes. She lost 2.6 pounds since here her last visit. Drinking only water or Crystal Light, eating a lot more vegetables, not eating fried foods, drinking 1% milk, not eating sugary cereals.    Wt Readings from Last 3 Encounters:  04/21/13 226 lb 6.4 oz (102.694 kg)  03/24/13 229 lb 1.6 oz (103.919 kg)  02/19/13 228 lb 1.3 oz (103.456 kg)   Ht Readings from Last 3 Encounters:  04/21/13 5\' 7"  (1.702 m)  03/24/13 5\' 7"  (1.702 m)  02/19/13 5' 7.5" (1.715 m)   Body mass index is 35.45 kg/(m^2). @BMIFA @ Normalized weight-for-age data available only for age 67 to 20 years. Normalized stature-for-age data available only for age 67 to 20 years.  MEDICATIONS: see list   DIETARY INTAKE:   Avoided foods include red beets, doesn't eat a lot of fruit because sometime upset stomach.    24-hr recall:  B (9 AM): 1/2 peanut butter sandwich OR boiled egg OR eggs with [place] sausage Or Cheerios with nutswith  with 1% and cup coffee with Splenda with creamer  Snk ( AM): sometimes nuts L ( PM):  salad with veggies or leftovers such as chicken with black eyed peas, collards, and corn with water Snk ( PM): splenda applesauce   almonds and raisins D ( PM): salad and spaghetti whole wheat  Snk ( PM): dry cereal or celery and peanut butter Beverages: water or crystal light  Usual physical activity: walking a few times per week for one hour and  swim 2 hours few times per week  Estimated energy needs: 1600 calories 180 g carbohydrates 120 g protein 44 g fat  Progress Towards Goal(s):  In progress.   Nutritional Diagnosis:  NB-1.1 Food and nutrition-related knowledge deficit As related to energy dense food choices, large portions, and excess sugar-sweetened beverage intake.  As evidenced by elevated fasting blood glucose and BMI of 35.9.     Intervention:  Nutrition counseling provided. Encouraged Nancy Deleon to continue making the changes she has made and recommended she work on eating slower at the table and add in more exercise when possible.   Plan: Continue to eat three meals per day and 2-3 snacks. Eat every 3-5 hour that you are awake.  Have snacks with protein.  Eat meals slowly (20 minutes) at the table with no TV.  Continue fill your lunch and dinner plates with vegetables, limit starch to a quarter of the plate. Save half of restaurant meals for second meal.  Aim for 30 minutes of exercise 5 x week.   Monitoring/Evaluation:  Dietary intake, exercise, and body weight in 2 month(s).

## 2013-06-25 ENCOUNTER — Ambulatory Visit: Payer: BC Managed Care – PPO | Admitting: Dietician

## 2013-08-15 ENCOUNTER — Ambulatory Visit (INDEPENDENT_AMBULATORY_CARE_PROVIDER_SITE_OTHER): Payer: BC Managed Care – PPO | Admitting: Family

## 2013-08-15 ENCOUNTER — Encounter: Payer: Self-pay | Admitting: Family

## 2013-08-15 ENCOUNTER — Ambulatory Visit (HOSPITAL_BASED_OUTPATIENT_CLINIC_OR_DEPARTMENT_OTHER)
Admission: RE | Admit: 2013-08-15 | Discharge: 2013-08-15 | Disposition: A | Discharge status: Home / Self Care | Payer: BC Managed Care – PPO | Source: Ambulatory Visit | Attending: Family | Admitting: Family

## 2013-08-15 VITALS — BP 104/72 | HR 61 | Temp 98.0°F | Resp 16 | Ht 67.5 in | Wt 228.0 lb

## 2013-08-15 DIAGNOSIS — R51 Headache: Secondary | ICD-10-CM | POA: Insufficient documentation

## 2013-08-15 DIAGNOSIS — M542 Cervicalgia: Secondary | ICD-10-CM

## 2013-08-15 DIAGNOSIS — R519 Headache, unspecified: Secondary | ICD-10-CM | POA: Insufficient documentation

## 2013-08-15 DIAGNOSIS — M5030 Other cervical disc degeneration, unspecified cervical region: Secondary | ICD-10-CM | POA: Insufficient documentation

## 2013-08-15 MED ORDER — MELOXICAM 7.5 MG PO TABS: 7.5000 mg | ORAL_TABLET | Freq: Every day | ORAL | Status: DC

## 2013-08-15 MED ORDER — KETOROLAC TROMETHAMINE 30 MG/ML IJ SOLN
60.0000 mg | Freq: Once | INTRAMUSCULAR | Status: AC
  Administered 2013-08-15: 60 mg via INTRAMUSCULAR

## 2013-08-15 MED ORDER — AMLODIPINE BESYLATE 10 MG PO TABS: 10.0000 mg | ORAL_TABLET | Freq: Every day | ORAL | Status: DC

## 2013-08-15 NOTE — Patient Instructions (Addendum)
Start meloxicam for neck pain once daily. Complete neck x ray on the first floor. Call if symptoms worsen or if not improved in 1-2 weeks.

## 2013-08-15 NOTE — Progress Notes (Signed)
Pre visit review using our clinic review tool, if applicable. No additional management support is needed unless otherwise documented below in the visit note. 

## 2013-08-15 NOTE — Assessment & Plan Note (Addendum)
Seems to be originating in the C spine and radiating up the back of the head.  IM toradol given in the office.  Start meloxicam for neck pain once daily. Complete neck x ray on the first floor. Call if symptoms worsen or if not improved in 1-2 weeks.

## 2013-08-15 NOTE — Progress Notes (Signed)
Subjective:    Patient ID: Nancy Deleon, female    DOB: 01/18/1956, 58 y.o.   MRN: [id number]  HPI  Nancy Deleon is a 58 yr old female who presents today with chief complaint of headache. Reports that her headache started a few months a go. Reports that pain is worse with coughing or straining.  Pain is located in the right parietal region. Has associated posterior neck pain and pain radiates up the back of the head.  Report pain started several months ago and is rated 8/10.  She has tried ibuprofen without significant improvement in her symptoms.  Denies photophobia, nausea, vomiting or phonophobia.  Reports increased stress. Mother has dementia.  She lives independently.  But family members take turns staying with her.  Review of Systems  Past Medical History  Diagnosis Date  . Hypertension   . Obesity     History   Social History  . Marital Status: Widowed    Spouse Name: N/A    Number of Children: 2  . Years of Education: N/A   Occupational History  .     Social History Main Topics  . Smoking status: Former Smoker    Quit date: 04/18/1985  . Smokeless tobacco: Never Used  . Alcohol Use: Yes     Comment: glass of wine with meals - sometimes  . Drug Use: No  . Sexual Activity: Yes    Partners: Male    Birth Control/ Protection: Post-menopausal   Other Topics Concern  . Not on file   Social History Narrative   Regular exercise:  Walks 2-5 x weekly   Caffeine Use: no   Widowed   She has a son Nancy Deleon- he is 56 yrs old.   Foster parent.   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   Dog (Lab named Abby)             Past Surgical History  Procedure Laterality Date  . Tubal ligation    . Cesarean section  [id number]  . Breast biopsy  04/24/11    rigth breast    Family History  Problem Relation Age of Onset  . Lymphoma Mother   . Cancer Mother     lymphoma  . Dementia Mother   . Cancer Father    lung  . Cancer Maternal Uncle     colon    Allergies  Allergen Reactions  . Naproxen     REACTION: vomiting  . Vicodin [Hydrocodone-Acetaminophen] Nausea And Vomiting  . Propoxyphene N-Acetaminophen Nausea And Vomiting    Current Outpatient Prescriptions on File Prior to Visit  Medication Sig Dispense Refill  . amLODipine (NORVASC) 10 MG tablet Take 1 tablet (10 mg total) by mouth daily.  90 tablet  1  . Calcium Carbonate-Vitamin D (CALTRATE 600+D) 600-400 MG-UNIT per tablet Take 1 tablet by mouth 2 (two) times daily.      . cetirizine (ZYRTEC) 10 MG tablet Take 10 mg by mouth daily.       No current facility-administered medications on file prior to visit.    BP 104/72  Pulse 61  Temp(Src) 98 F (36.7 C) (Oral)  Resp 16  Ht 5' 7.5" (1.715 m)  Wt 228 lb (103.42 kg)  BMI 35.16 kg/m2  SpO2 98%  LMP 02/28/2007        Objective:   Physical Exam  Constitutional: She is oriented to person, place, and  time. She appears well-developed and well-nourished. No distress.  HENT:  Head: Normocephalic and atraumatic.  Cardiovascular: Normal rate and regular rhythm.   No murmur heard. Pulmonary/Chest: Effort normal and breath sounds normal. No respiratory distress. She has no wheezes. She has no rales. She exhibits no tenderness.  Musculoskeletal: She exhibits no edema.  + musculoskeletal tenderness along the c spine.  Some associated scalp tenderness on the back of the scalp, no erythema or swelling.   Neurological: She is alert and oriented to person, place, and time.  EOM intact, + facial symmetry, bilateral UE/LE strength is 5/5.  Psychiatric: She has a normal mood and affect. Her behavior is normal. Judgment and thought content normal.          Assessment & Plan:  Of noted allergies report nausea/vomitting with naproxen, but pt reports that she took two aleve the other day and had no adverse side effects.

## 2013-08-18 ENCOUNTER — Telehealth: Payer: Self-pay | Admitting: *Deleted

## 2013-08-18 NOTE — Telephone Encounter (Signed)
Pt left message that [facility] did not receive her refills from Friday for amlodipine and meloxicam.  Upon review of chart, Rxs went to [facility] (mail order).  Left detailed message for pt to call with pharmacy clarification. Also does she need 30 day to local if she is using mail order?

## 2013-08-20 ENCOUNTER — Encounter: Payer: Self-pay | Admitting: Family

## 2013-08-20 ENCOUNTER — Ambulatory Visit (INDEPENDENT_AMBULATORY_CARE_PROVIDER_SITE_OTHER): Payer: BC Managed Care – PPO | Admitting: Family

## 2013-08-20 VITALS — BP 108/78 | HR 66 | Temp 98.3°F | Resp 16 | Ht 67.5 in | Wt 226.0 lb

## 2013-08-20 DIAGNOSIS — G2581 Restless legs syndrome: Secondary | ICD-10-CM

## 2013-08-20 DIAGNOSIS — R7301 Impaired fasting glucose: Secondary | ICD-10-CM

## 2013-08-20 DIAGNOSIS — D649 Anemia, unspecified: Secondary | ICD-10-CM

## 2013-08-20 DIAGNOSIS — E119 Type 2 diabetes mellitus without complications: Secondary | ICD-10-CM

## 2013-08-20 DIAGNOSIS — I10 Essential (primary) hypertension: Secondary | ICD-10-CM

## 2013-08-20 DIAGNOSIS — D509 Iron deficiency anemia, unspecified: Secondary | ICD-10-CM

## 2013-08-20 DIAGNOSIS — R51 Headache: Secondary | ICD-10-CM

## 2013-08-20 LAB — CBC WITH DIFFERENTIAL/PLATELET
BASOS PCT: 1 % (ref 0–1)
Basophils Absolute: 0 10*3/uL (ref 0.0–0.1)
EOS ABS: 0.2 10*3/uL (ref 0.0–0.7)
EOS PCT: 4 % (ref 0–5)
HCT: 38.9 % (ref 36.0–46.0)
Hemoglobin: 13 g/dL (ref 12.0–15.0)
LYMPHS ABS: 2 10*3/uL (ref 0.7–4.0)
Lymphocytes Relative: 37 % (ref 12–46)
MCH: 25.3 pg — AB (ref 26.0–34.0)
MCHC: 33.4 g/dL (ref 30.0–36.0)
MCV: 75.7 fL — AB (ref 78.0–100.0)
MONOS PCT: 7 % (ref 3–12)
Monocytes Absolute: 0.4 10*3/uL (ref 0.1–1.0)
NEUTROS PCT: 51 % (ref 43–77)
Neutro Abs: 2.7 10*3/uL (ref 1.7–7.7)
PLATELETS: 168 10*3/uL (ref 150–400)
RBC: 5.14 MIL/uL — AB (ref 3.87–5.11)
RDW: 14.4 % (ref 11.5–15.5)
WBC: 5.4 10*3/uL (ref 4.0–10.5)

## 2013-08-20 LAB — BASIC METABOLIC PANEL WITH GFR
BUN: 15 mg/dL (ref 6–23)
CALCIUM: 9.5 mg/dL (ref 8.4–10.5)
CO2: 27 meq/L (ref 19–32)
Chloride: 105 mEq/L (ref 96–112)
Creat: 0.73 mg/dL (ref 0.50–1.10)
GFR, Est Non African American: >89 mL/min
GLUCOSE: 103 mg/dL — AB (ref 70–99)
Potassium: 4.2 mEq/L (ref 3.5–5.3)
SODIUM: 140 meq/L (ref 135–145)

## 2013-08-20 LAB — HEMOGLOBIN A1C
Hgb A1c MFr Bld: 6.2 % — ABNORMAL HIGH (ref <5.7)
Mean Plasma Glucose: 131 mg/dL — ABNORMAL HIGH (ref <117)

## 2013-08-20 MED ORDER — ROPINIROLE HCL 1 MG PO TABS: 1.0000 mg | ORAL_TABLET | Freq: Every day | ORAL | Status: DC

## 2013-08-20 MED ORDER — MELOXICAM 7.5 MG PO TABS: 7.5000 mg | ORAL_TABLET | Freq: Every day | ORAL | Status: DC

## 2013-08-20 NOTE — Progress Notes (Signed)
Pre visit review using our clinic review tool, if applicable. No additional management support is needed unless otherwise documented below in the visit note. 

## 2013-08-20 NOTE — Assessment & Plan Note (Signed)
Improved with im toradol. Rx provided for meloxicam.

## 2013-08-20 NOTE — Assessment & Plan Note (Signed)
Uncontrolled. Trial of requip.

## 2013-08-20 NOTE — Assessment & Plan Note (Signed)
Check A1c. 

## 2013-08-20 NOTE — Telephone Encounter (Signed)
Rx completed at 08/20/13 office visit. 

## 2013-08-20 NOTE — Assessment & Plan Note (Signed)
BP stable on amlodipine, continue same, obtain bmet.  

## 2013-08-20 NOTE — Progress Notes (Signed)
Subjective:    Patient ID: Nancy Deleon, female    DOB: [birth year], 57 y.o.   MRN: [id number]  HPI  Nancy Deleon is a 58 yr old female who presents today for routine follow up.   HTN- she is maintained on amlodipine 10 mg.  BP Readings from Last 3 Encounters:  08/20/13 108/78  08/15/13 104/72  02/19/13 100/78   Denies CP, sob or swelling.   Fasting hyperglycemia-  Last A1C on file was 6.  Iron deficiency anemia-  Lab Results  Component Value Date   WBC 5.2 02/19/2013   HGB 12.5 02/19/2013   HCT 36.3 02/19/2013   MCV 72.6* 02/19/2013   PLT 147* 02/19/2013   RLS- continues to bother her. Had trouble sleeping last night due to RLS.  Neck pain- reports improvement in her neck pain after IM toradol. Rx for meloxicam went to mail order in error and she has not yet started.    Review of Systems See HPI  Past Medical History  Diagnosis Date  . Hypertension   . Obesity     History   Social History  . Marital Status: Widowed    Spouse Name: N/A    Number of Children: 2  . Years of Education: N/A   Occupational History  .     Social History Main Topics  . Smoking status: Former Smoker    Quit date: 04/18/1985  . Smokeless tobacco: Never Used  . Alcohol Use: Yes     Comment: glass of wine with meals - sometimes  . Drug Use: No  . Sexual Activity: Yes    Partners: Male    Birth Control/ Protection: Post-menopausal   Other Topics Concern  . Not on file   Social History Narrative   Regular exercise:  Walks 2-5 x weekly   Caffeine Use: no   Widowed   She has a son [name]- he is 81 yrs old.   Foster parent.   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   Dog (Lab named Abby)             Past Surgical History  Procedure Laterality Date  . Tubal ligation    . Cesarean section  [id number]  . Breast biopsy  04/24/11    rigth breast    Family History  Problem Relation Age of Onset  . Lymphoma Mother    . Cancer Mother     lymphoma  . Dementia Mother   . Cancer Father     lung  . Cancer Maternal Uncle     colon    Allergies  Allergen Reactions  . Naproxen     REACTION: vomiting  . Vicodin [Hydrocodone-Acetaminophen] Nausea And Vomiting  . Propoxyphene N-Acetaminophen Nausea And Vomiting    Current Outpatient Prescriptions on File Prior to Visit  Medication Sig Dispense Refill  . amLODipine (NORVASC) 10 MG tablet Take 1 tablet (10 mg total) by mouth daily.  90 tablet  1  . Calcium Carbonate-Vitamin D (CALTRATE 600+D) 600-400 MG-UNIT per tablet Take 1 tablet by mouth 2 (two) times daily.      . cetirizine (ZYRTEC) 10 MG tablet Take 10 mg by mouth daily.      . meloxicam (MOBIC) 7.5 MG tablet Take 1 tablet (7.5 mg total) by mouth daily.  30 tablet  0   No current facility-administered medications on file prior to visit.    BP  108/78  Pulse 66  Temp(Src) 98.3 F (36.8 C) (Oral)  Resp 16  Ht 5' 7.5" (1.715 m)  Wt 226 lb (102.513 kg)  BMI 34.85 kg/m2  SpO2 99%  LMP 02/28/2007       Objective:   Physical Exam  Constitutional: She is oriented to person, place, and time. She appears well-developed and well-nourished. No distress.  HENT:  Head: Normocephalic and atraumatic.  Eyes: No scleral icterus.  Cardiovascular: Normal rate and regular rhythm.   No murmur heard. Pulmonary/Chest: Effort normal and breath sounds normal. No respiratory distress. She has no wheezes. She has no rales. She exhibits no tenderness.  Neurological: She is alert and oriented to person, place, and time.  Psychiatric: She has a normal mood and affect. Her behavior is normal. Judgment and thought content normal.          Assessment & Plan:

## 2013-08-20 NOTE — Patient Instructions (Signed)
Start requip at bedtime for your restless leg. Complete lab work prior to leaving. Follow up in 3 months.

## 2013-08-20 NOTE — Assessment & Plan Note (Signed)
Clinically stable, obtain cbc.

## 2013-08-21 ENCOUNTER — Encounter: Payer: Self-pay | Admitting: Family

## 2013-08-21 ENCOUNTER — Telehealth: Payer: Self-pay

## 2013-08-21 NOTE — Telephone Encounter (Signed)
Relevant patient education assigned to patient using Emmi. ° °

## 2013-09-10 ENCOUNTER — Telehealth: Payer: Self-pay | Admitting: Family

## 2013-09-10 NOTE — Telephone Encounter (Signed)
Relevant patient education mailed to patient.  

## 2013-11-17 ENCOUNTER — Encounter: Payer: Self-pay | Admitting: Family

## 2013-11-17 ENCOUNTER — Ambulatory Visit (INDEPENDENT_AMBULATORY_CARE_PROVIDER_SITE_OTHER): Payer: BC Managed Care – PPO | Admitting: Family

## 2013-11-17 VITALS — BP 120/78 | HR 62 | Temp 98.9°F | Ht 67.5 in | Wt 229.0 lb

## 2013-11-17 DIAGNOSIS — J309 Allergic rhinitis, unspecified: Secondary | ICD-10-CM

## 2013-11-17 DIAGNOSIS — M25579 Pain in unspecified ankle and joints of unspecified foot: Secondary | ICD-10-CM

## 2013-11-17 DIAGNOSIS — J302 Other seasonal allergic rhinitis: Secondary | ICD-10-CM

## 2013-11-17 NOTE — Progress Notes (Signed)
Subjective:    Patient ID: Nancy Deleon, female    DOB: [birth year], 58 y.o.   MRN: [id number]  HPI  Nancy Deleon is a 58 yr old female who presents today with chief complaint of "knot" on her left ankle.  Reports that her left ankle started "aching real bad."  Went to urgent care on Friday and was given note out of work x 2 days + was given ibuprofen.  Since that time has developed "knot."  Reports area is not longer painful. Denies hx of gout.  Seasonal allergies- notes recently allergy symptoms have worsened.  +nasal congestion, eye itching, scratchy throat.  Review of Systems    see HPI  Past Medical History  Diagnosis Date  . Hypertension   . Obesity     History   Social History  . Marital Status: Widowed    Spouse Name: N/A    Number of Children: 2  . Years of Education: N/A   Occupational History  .     Social History Main Topics  . Smoking status: Former Smoker    Quit date: 04/18/1985  . Smokeless tobacco: Never Used  . Alcohol Use: Yes     Comment: glass of wine with meals - sometimes  . Drug Use: No  . Sexual Activity: Yes    Partners: Male    Birth Control/ Protection: Post-menopausal   Other Topics Concern  . Not on file   Social History Narrative   Regular exercise:  Walks 2-5 x weekly   Caffeine Use: no   Widowed   She has a son [name]- he is 54 yrs old.   Foster parent.   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   Dog (Lab named Abby)             Past Surgical History  Procedure Laterality Date  . Tubal ligation    . Cesarean section  [id number]  . Breast biopsy  04/24/11    rigth breast    Family History  Problem Relation Age of Onset  . Lymphoma Mother   . Cancer Mother     lymphoma  . Dementia Mother   . Cancer Father     lung  . Cancer Maternal Uncle     colon    Allergies  Allergen Reactions  . Naproxen     REACTION: vomiting  . Vicodin  [Hydrocodone-Acetaminophen] Nausea And Vomiting  . Propoxyphene N-Acetaminophen Nausea And Vomiting    Current Outpatient Prescriptions on File Prior to Visit  Medication Sig Dispense Refill  . amLODipine (NORVASC) 10 MG tablet Take 1 tablet (10 mg total) by mouth daily.  90 tablet  1  . Calcium Carbonate-Vitamin D (CALTRATE 600+D) 600-400 MG-UNIT per tablet Take 1 tablet by mouth 2 (two) times daily.      . cetirizine (ZYRTEC) 10 MG tablet Take 10 mg by mouth daily.      . meloxicam (MOBIC) 7.5 MG tablet Take 1 tablet (7.5 mg total) by mouth daily.  30 tablet  0  . rOPINIRole (REQUIP) 1 MG tablet Take 1 tablet (1 mg total) by mouth at bedtime.  30 tablet  2   No current facility-administered medications on file prior to visit.    BP 120/78  Pulse 62  Temp(Src) 98.9 F (37.2 C) (Oral)  Ht 5' 7.5" (1.715 m)  Wt 229 lb 0.6 oz (103.892 kg)  BMI 35.32 kg/m2  SpO2 99%  LMP 02/28/2007    Objective:   Physical Exam  Constitutional: She appears well-developed and well-nourished. No distress.  HENT:  Head: Normocephalic and atraumatic.  Cardiovascular: Normal rate and regular rhythm.   No murmur heard. Pulmonary/Chest: Effort normal and breath sounds normal. No respiratory distress. She has no wheezes. She has no rales. She exhibits no tenderness.  Musculoskeletal:  Mild swelling of left lateral ankle without erythema or tenderness.  Full ROM of the left ankle.   Lymphadenopathy:    She has no cervical adenopathy.  Neurological: She is alert.  Psychiatric: She has a normal mood and affect. Her behavior is normal. Judgment and thought content normal.          Assessment & Plan:

## 2013-11-17 NOTE — Assessment & Plan Note (Signed)
Recommended zyrtec.

## 2013-11-17 NOTE — Progress Notes (Signed)
Pre visit review using our clinic review tool, if applicable. No additional management support is needed unless otherwise documented below in the visit note. 

## 2013-11-17 NOTE — Patient Instructions (Signed)
Start zyrtec once daily. Call if increased pain/swelling of the let ankle. Please schedule a follow up appointment in 3 months.

## 2013-11-17 NOTE — Assessment & Plan Note (Signed)
Resolving.  Advised pt to call if symptoms worsen or if symptoms recur.

## 2013-11-21 ENCOUNTER — Ambulatory Visit: Payer: BC Managed Care – PPO | Admitting: Family

## 2013-11-24 ENCOUNTER — Ambulatory Visit: Payer: BC Managed Care – PPO | Admitting: Podiatry

## 2014-01-12 ENCOUNTER — Encounter: Payer: Self-pay | Admitting: Family

## 2014-01-12 ENCOUNTER — Ambulatory Visit (INDEPENDENT_AMBULATORY_CARE_PROVIDER_SITE_OTHER): Payer: BC Managed Care – PPO | Admitting: Family

## 2014-01-12 VITALS — BP 102/60 | HR 61 | Temp 98.0°F | Resp 16 | Ht 67.5 in | Wt 230.0 lb

## 2014-01-12 DIAGNOSIS — J029 Acute pharyngitis, unspecified: Secondary | ICD-10-CM

## 2014-01-12 DIAGNOSIS — J069 Acute upper respiratory infection, unspecified: Secondary | ICD-10-CM

## 2014-01-12 LAB — POCT RAPID STREP A (OFFICE): Rapid Strep A Screen: NEGATIVE

## 2014-01-12 NOTE — Progress Notes (Signed)
Pre visit review using our clinic review tool, if applicable. No additional management support is needed unless otherwise documented below in the visit note. 

## 2014-01-12 NOTE — Assessment & Plan Note (Signed)
Rapid strep negative. Symptoms most consistent with URI.  Advised pt on supportive measures, delsym prn cough, tylenol or motrin prn aches/pain. Follow up if symptoms worsen or if symptoms do not improve in next few days.

## 2014-01-12 NOTE — Patient Instructions (Signed)

## 2014-01-12 NOTE — Progress Notes (Signed)
Subjective:    Patient ID: Nancy Deleon, female    DOB: 1956-03-30, 58 y.o.   MRN: [id number]  HPI  Nancy Deleon is a 58 yo female who presents today with sore throat since 2 days ago, cough (productive of thick, white mucous), chills, sweats, congestion and rhinorrhea, sneezing. Has taken tylenol and delsym without relief. Cough is keeping her up at night.  Review of Systems  HENT: Positive for congestion, postnasal drip, sinus pressure, sneezing and sore throat. Negative for ear discharge and ear pain.   Eyes: Positive for discharge (watery eyes) and itching.  Respiratory: Positive for chest tightness and shortness of breath (when coughing, especially at night.).   Cardiovascular: Negative for chest pain.   Does have seasonal allergies.  Past Medical History  Diagnosis Date  . Hypertension   . Obesity     History   Social History  . Marital Status: Widowed    Spouse Name: N/A    Number of Children: 2  . Years of Education: N/A   Occupational History  .     Social History Main Topics  . Smoking status: Former Smoker    Quit date: 04/18/1985  . Smokeless tobacco: Never Used  . Alcohol Use: Yes     Comment: glass of wine with meals - sometimes  . Drug Use: No  . Sexual Activity: Yes    Partners: Male    Birth Control/ Protection: Post-menopausal   Other Topics Concern  . Not on file   Social History Narrative   Regular exercise:  Walks 2-5 x weekly   Caffeine Use: no   Widowed   She has a son Nancy Deleon- he is 33 yrs old.   Foster parent.   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   Dog (Lab named Abby)             Past Surgical History  Procedure Laterality Date  . Tubal ligation    . Cesarean section  [id number]  . Breast biopsy  04/24/11    rigth breast    Family History  Problem Relation Age of Onset  . Lymphoma Mother   . Cancer Mother     lymphoma  . Dementia Mother   . Cancer Father       lung  . Cancer Maternal Uncle     colon    Allergies  Allergen Reactions  . Naproxen     REACTION: vomiting  . Vicodin [Hydrocodone-Acetaminophen] Nausea And Vomiting  . Propoxyphene N-Acetaminophen Nausea And Vomiting    Current Outpatient Prescriptions on File Prior to Visit  Medication Sig Dispense Refill  . amLODipine (NORVASC) 10 MG tablet Take 1 tablet (10 mg total) by mouth daily.  90 tablet  1  . Calcium Carbonate-Vitamin D (CALTRATE 600+D) 600-400 MG-UNIT per tablet Take 1 tablet by mouth 2 (two) times daily.      . cetirizine (ZYRTEC) 10 MG tablet Take 10 mg by mouth daily.      . meloxicam (MOBIC) 7.5 MG tablet Take 1 tablet (7.5 mg total) by mouth daily.  30 tablet  0  . rOPINIRole (REQUIP) 1 MG tablet Take 1 tablet (1 mg total) by mouth at bedtime.  30 tablet  2   No current facility-administered medications on file prior to visit.    BP 102/60  Pulse 61  Temp(Src) 98 F (36.7 C) (Oral)  Resp 16  Ht 5'  7.5" (1.715 m)  Wt 230 lb (104.327 kg)  BMI 35.47 kg/m2  SpO2 99%  LMP 02/28/2007       Objective:   Physical Exam  Constitutional: She is oriented to person, place, and time. She appears well-developed and well-nourished. No distress.  HENT:  Head: Normocephalic and atraumatic.  Right Ear: Tympanic membrane and ear canal normal.  Left Ear: Tympanic membrane and ear canal normal.  Mouth/Throat: Oropharynx is clear and moist. No oropharyngeal exudate or posterior oropharyngeal erythema.  Eyes: Left eye exhibits no discharge. No scleral icterus.  Neck: Normal range of motion.  Cardiovascular: Normal rate, regular rhythm and normal heart sounds.   Pulmonary/Chest: Effort normal and breath sounds normal. No respiratory distress. She has no wheezes. She has no rales.  Lymphadenopathy:    She has no cervical adenopathy.  Neurological: She is alert and oriented to person, place, and time.  Skin: Skin is warm and dry.  Psychiatric: She has a normal mood  and affect. Her behavior is normal.          Assessment & Plan:

## 2014-01-21 ENCOUNTER — Other Ambulatory Visit: Payer: Self-pay | Admitting: Gynecology

## 2014-01-21 DIAGNOSIS — Z1231 Encounter for screening mammogram for malignant neoplasm of breast: Secondary | ICD-10-CM

## 2014-01-26 ENCOUNTER — Ambulatory Visit (HOSPITAL_COMMUNITY)
Admission: RE | Admit: 2014-01-26 | Discharge: 2014-01-26 | Disposition: A | Discharge status: Home / Self Care | Payer: BC Managed Care – PPO | Source: Ambulatory Visit | Attending: Gynecology | Admitting: Gynecology

## 2014-01-26 DIAGNOSIS — Z1231 Encounter for screening mammogram for malignant neoplasm of breast: Secondary | ICD-10-CM

## 2014-02-09 ENCOUNTER — Encounter: Payer: Self-pay | Admitting: Family

## 2014-02-09 ENCOUNTER — Ambulatory Visit (INDEPENDENT_AMBULATORY_CARE_PROVIDER_SITE_OTHER): Payer: BC Managed Care – PPO | Admitting: Family

## 2014-02-09 VITALS — BP 110/78 | HR 60 | Temp 98.0°F | Resp 16 | Ht 67.5 in | Wt 234.0 lb

## 2014-02-09 DIAGNOSIS — R7309 Other abnormal glucose: Secondary | ICD-10-CM

## 2014-02-09 DIAGNOSIS — R739 Hyperglycemia, unspecified: Secondary | ICD-10-CM

## 2014-02-09 DIAGNOSIS — D509 Iron deficiency anemia, unspecified: Secondary | ICD-10-CM

## 2014-02-09 DIAGNOSIS — K219 Gastro-esophageal reflux disease without esophagitis: Secondary | ICD-10-CM

## 2014-02-09 DIAGNOSIS — R7301 Impaired fasting glucose: Secondary | ICD-10-CM

## 2014-02-09 DIAGNOSIS — R079 Chest pain, unspecified: Secondary | ICD-10-CM

## 2014-02-09 DIAGNOSIS — E785 Hyperlipidemia, unspecified: Secondary | ICD-10-CM

## 2014-02-09 DIAGNOSIS — R9431 Abnormal electrocardiogram [ECG] [EKG]: Secondary | ICD-10-CM

## 2014-02-09 DIAGNOSIS — G2581 Restless legs syndrome: Secondary | ICD-10-CM

## 2014-02-09 DIAGNOSIS — I441 Atrioventricular block, second degree: Secondary | ICD-10-CM

## 2014-02-09 DIAGNOSIS — I10 Essential (primary) hypertension: Secondary | ICD-10-CM

## 2014-02-09 LAB — CBC WITH DIFFERENTIAL/PLATELET
BASOS ABS: 0 10*3/uL (ref 0.0–0.1)
BASOS PCT: 1 % (ref 0–1)
Eosinophils Absolute: 0.2 10*3/uL (ref 0.0–0.7)
Eosinophils Relative: 4 % (ref 0–5)
HCT: 36.6 % (ref 36.0–46.0)
Hemoglobin: 12.7 g/dL (ref 12.0–15.0)
Lymphocytes Relative: 39 % (ref 12–46)
Lymphs Abs: 1.9 10*3/uL (ref 0.7–4.0)
MCH: 25.8 pg — ABNORMAL LOW (ref 26.0–34.0)
MCHC: 34.7 g/dL (ref 30.0–36.0)
MCV: 74.2 fL — ABNORMAL LOW (ref 78.0–100.0)
Monocytes Absolute: 0.3 10*3/uL (ref 0.1–1.0)
Monocytes Relative: 6 % (ref 3–12)
Neutro Abs: 2.5 10*3/uL (ref 1.7–7.7)
Neutrophils Relative %: 50 % (ref 43–77)
Platelets: 167 10*3/uL (ref 150–400)
RBC: 4.93 MIL/uL (ref 3.87–5.11)
RDW: 13.8 % (ref 11.5–15.5)
WBC: 4.9 10*3/uL (ref 4.0–10.5)

## 2014-02-09 LAB — HEMOGLOBIN A1C
HEMOGLOBIN A1C: 6.2 % — AB (ref <5.7)
Mean Plasma Glucose: 131 mg/dL — ABNORMAL HIGH (ref <117)

## 2014-02-09 LAB — BASIC METABOLIC PANEL WITH GFR
BUN: 9 mg/dL (ref 6–23)
CO2: 28 mEq/L (ref 19–32)
CREATININE: 0.86 mg/dL (ref 0.50–1.10)
Calcium: 9.3 mg/dL (ref 8.4–10.5)
Chloride: 105 mEq/L (ref 96–112)
GFR, EST AFRICAN AMERICAN: 87 mL/min
GFR, EST NON AFRICAN AMERICAN: 75 mL/min
Glucose, Bld: 134 mg/dL — ABNORMAL HIGH (ref 70–99)
POTASSIUM: 4 meq/L (ref 3.5–5.3)
Sodium: 142 mEq/L (ref 135–145)

## 2014-02-09 LAB — LIPID PANEL
CHOL/HDL RATIO: 3.4 ratio
Cholesterol: 176 mg/dL (ref 0–200)
HDL: 52 mg/dL (ref >39)
LDL Cholesterol: 108 mg/dL — ABNORMAL HIGH (ref 0–99)
TRIGLYCERIDES: 80 mg/dL (ref <150)
VLDL: 16 mg/dL (ref 0–40)

## 2014-02-09 LAB — HEPATIC FUNCTION PANEL
ALT: 16 U/L (ref 0–35)
AST: 18 U/L (ref 0–37)
Albumin: 4.2 g/dL (ref 3.5–5.2)
Alkaline Phosphatase: 67 U/L (ref 39–117)
BILIRUBIN DIRECT: 0.1 mg/dL (ref 0.0–0.3)
Indirect Bilirubin: 0.5 mg/dL (ref 0.2–1.2)
Total Bilirubin: 0.6 mg/dL (ref 0.2–1.2)
Total Protein: 6.9 g/dL (ref 6.0–8.3)

## 2014-02-09 MED ORDER — OMEPRAZOLE 40 MG PO CPDR: 40.0000 mg | DELAYED_RELEASE_CAPSULE | Freq: Every day | ORAL | Status: DC

## 2014-02-09 NOTE — Progress Notes (Signed)
Pre visit review using our clinic review tool, if applicable. No additional management support is needed unless otherwise documented below in the visit note. 

## 2014-02-09 NOTE — Assessment & Plan Note (Signed)
Obtain A1C.  Clinically stable.

## 2014-02-09 NOTE — Assessment & Plan Note (Signed)
Maintained on amlodipine.  BP stable, continue same.

## 2014-02-09 NOTE — Progress Notes (Signed)
Subjective:    Patient ID: Nancy Deleon, female    DOB: April 07, 1956, 58 y.o.   MRN: [id number]  HPI  Nancy Deleon is a 58 yr old female who presents today for follow up of multiple medical problems:  1) Iron Deficiency Anemia- Not on iron supplement. Denies black/bloody stools.  Reports good energy. Lab Results  Component Value Date   WBC 5.4 08/20/2013   HGB 13.0 08/20/2013   HCT 38.9 08/20/2013   MCV 75.7* 08/20/2013   PLT 168 08/20/2013    2) HTN- maintained on amlodipine. Denies CP, SOB or swelling.   BP Readings from Last 3 Encounters:  02/09/14 110/78  01/12/14 102/60  11/17/13 120/78   3) RLS- reports RLS symptoms are stable off of requip. Places a pillow between her legs which helps a lot.   4) Hyperglycemia- reports that she tries to watch her diet. She started swimming and walking.   Lab Results  Component Value Date   HGBA1C 6.2* 08/20/2013   5) "Indigestion"- has been on antacids in the past.  Not currently on antacid.  Denies associated nausea/vomitting.  Symptoms resolve with use of tums.  Symptoms occur  About once a week.      Review of Systems Past Medical History  Diagnosis Date  . Hypertension   . Obesity     History   Social History  . Marital Status: Widowed    Spouse Name: N/A    Number of Children: 2  . Years of Education: N/A   Occupational History  .     Social History Main Topics  . Smoking status: Former Smoker    Quit date: 04/18/1985  . Smokeless tobacco: Never Used  . Alcohol Use: Yes     Comment: glass of wine with meals - sometimes  . Drug Use: No  . Sexual Activity: Yes    Partners: Male    Birth Control/ Protection: Post-menopausal   Other Topics Concern  . Not on file   Social History Narrative   Regular exercise:  Walks 2-5 x weekly   Caffeine Use: no   Widowed   She has a son Nancy Deleon- he is 70 yrs old.   Foster parent.   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   Dog (Lab named Abby)             Past Surgical History  Procedure Laterality Date  . Tubal ligation    . Cesarean section  [id number]  . Breast biopsy  04/24/11    rigth breast    Family History  Problem Relation Age of Onset  . Lymphoma Mother   . Cancer Mother     lymphoma  . Dementia Mother   . Cancer Father     lung  . Cancer Maternal Uncle     colon    Allergies  Allergen Reactions  . Naproxen     REACTION: vomiting  . Ropinirole Nausea Only  . Vicodin [Hydrocodone-Acetaminophen] Nausea And Vomiting  . Propoxyphene N-Acetaminophen Nausea And Vomiting    Current Outpatient Prescriptions on File Prior to Visit  Medication Sig Dispense Refill  . amLODipine (NORVASC) 10 MG tablet Take 1 tablet (10 mg total) by mouth daily.  90 tablet  1  . Calcium Carbonate-Vitamin D (CALTRATE 600+D) 600-400 MG-UNIT per tablet Take 1 tablet by mouth 2 (two) times daily.       No current  facility-administered medications on file prior to visit.    BP 110/78  Pulse 60  Temp(Src) 98 F (36.7 C) (Oral)  Resp 16  Ht 5' 7.5" (1.715 m)  Wt 234 lb (106.142 kg)  BMI 36.09 kg/m2  SpO2 99%  LMP 02/28/2007      see HPI  Past Medical History  Diagnosis Date  . Hypertension   . Obesity     History   Social History  . Marital Status: Widowed    Spouse Name: N/A    Number of Children: 2  . Years of Education: N/A   Occupational History  .     Social History Main Topics  . Smoking status: Former Smoker    Quit date: 04/18/1985  . Smokeless tobacco: Never Used  . Alcohol Use: Yes     Comment: glass of wine with meals - sometimes  . Drug Use: No  . Sexual Activity: Yes    Partners: Male    Birth Control/ Protection: Post-menopausal   Other Topics Concern  . Not on file   Social History Narrative   Regular exercise:  Walks 2-5 x weekly   Caffeine Use: no   Widowed   She has a son Nancy Deleon- he is 61 yrs old.   Foster parent.   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   Dog (Lab named Abby)             Past Surgical History  Procedure Laterality Date  . Tubal ligation    . Cesarean section  [id number]  . Breast biopsy  04/24/11    rigth breast    Family History  Problem Relation Age of Onset  . Lymphoma Mother   . Cancer Mother     lymphoma  . Dementia Mother   . Cancer Father     lung  . Cancer Maternal Uncle     colon    Allergies  Allergen Reactions  . Naproxen     REACTION: vomiting  . Ropinirole Nausea Only  . Vicodin [Hydrocodone-Acetaminophen] Nausea And Vomiting  . Propoxyphene N-Acetaminophen Nausea And Vomiting    Current Outpatient Prescriptions on File Prior to Visit  Medication Sig Dispense Refill  . amLODipine (NORVASC) 10 MG tablet Take 1 tablet (10 mg total) by mouth daily.  90 tablet  1  . Calcium Carbonate-Vitamin D (CALTRATE 600+D) 600-400 MG-UNIT per tablet Take 1 tablet by mouth 2 (two) times daily.       No current facility-administered medications on file prior to visit.    BP 110/78  Pulse 60  Temp(Src) 98 F (36.7 C) (Oral)  Resp 16  Ht 5' 7.5" (1.715 m)  Wt 234 lb (106.142 kg)  BMI 36.09 kg/m2  SpO2 99%  LMP 02/28/2007    Objective:   Physical Exam  Constitutional: She appears well-developed and well-nourished. No distress.  Cardiovascular: Normal rate and regular rhythm.   No murmur heard. Pulmonary/Chest: Effort normal and breath sounds normal. No respiratory distress. She has no wheezes. She has no rales. She exhibits no tenderness.  Musculoskeletal: She exhibits no edema.  Psychiatric: She has a normal mood and affect. Her behavior is normal. Judgment and thought content normal.          Assessment & Plan:

## 2014-02-09 NOTE — Patient Instructions (Signed)
Please complete lab work prior to leaving. You will be contacted about your referral to cardiology.  Please let us know if you have not heard back within 1 week about your referral. Start Prilosec (omeprazole).  Call if symptoms worsen or if not improved in 2 weeks. Please schedule a follow up appointment in 3 months.

## 2014-02-09 NOTE — Assessment & Plan Note (Signed)
Stable off of meds.  Monitor.  

## 2014-02-09 NOTE — Assessment & Plan Note (Signed)
Check iron level, check CBC.

## 2014-02-09 NOTE — Assessment & Plan Note (Signed)
EKG performed today notes: 2nd degree AV block.  Will refer to cardiology for further evaluation.

## 2014-02-09 NOTE — Assessment & Plan Note (Signed)
Deteriorated. Resume PPI.

## 2014-02-10 ENCOUNTER — Encounter: Payer: Self-pay | Admitting: Family

## 2014-02-10 MED ORDER — MULTI-VITAMIN/MINERALS PO TABS: 1.0000 | ORAL_TABLET | Freq: Every day | ORAL | Status: DC

## 2014-03-04 ENCOUNTER — Other Ambulatory Visit: Payer: Self-pay | Admitting: *Deleted

## 2014-03-04 ENCOUNTER — Ambulatory Visit (INDEPENDENT_AMBULATORY_CARE_PROVIDER_SITE_OTHER): Payer: BC Managed Care – PPO | Admitting: Gynecology

## 2014-03-04 ENCOUNTER — Encounter: Payer: Self-pay | Admitting: Gynecology

## 2014-03-04 ENCOUNTER — Other Ambulatory Visit (HOSPITAL_COMMUNITY)
Admission: RE | Admit: 2014-03-04 | Discharge: 2014-03-04 | Disposition: A | Discharge status: Home / Self Care | Payer: BC Managed Care – PPO | Source: Ambulatory Visit | Attending: Gynecology | Admitting: Gynecology

## 2014-03-04 VITALS — BP 122/76 | Ht 67.0 in | Wt 236.0 lb

## 2014-03-04 DIAGNOSIS — Z01419 Encounter for gynecological examination (general) (routine) without abnormal findings: Secondary | ICD-10-CM | POA: Insufficient documentation

## 2014-03-04 DIAGNOSIS — Z1151 Encounter for screening for human papillomavirus (HPV): Secondary | ICD-10-CM | POA: Insufficient documentation

## 2014-03-04 DIAGNOSIS — N952 Postmenopausal atrophic vaginitis: Secondary | ICD-10-CM

## 2014-03-04 MED ORDER — AMLODIPINE BESYLATE 10 MG PO TABS: 10.0000 mg | ORAL_TABLET | Freq: Every day | ORAL | Status: DC

## 2014-03-04 NOTE — Patient Instructions (Signed)
Followup in one year, sooner if any issues. Report any vaginal bleeding.  You may obtain a copy of any labs that were done today by logging onto MyChart as outlined in the instructions provided with your AVS (after visit summary). The office will not call with normal lab results but certainly if there are any significant abnormalities then we will contact you.   Health Maintenance, Female A healthy lifestyle and preventative care can promote health and wellness.  Maintain regular health, dental, and eye exams.  Eat a healthy diet. Foods like vegetables, fruits, whole grains, low-fat dairy products, and lean protein foods contain the nutrients you need without too many calories. Decrease your intake of foods high in solid fats, added sugars, and salt. Get information about a proper diet from your caregiver, if necessary.  Regular physical exercise is one of the most important things you can do for your health. Most adults should get at least 150 minutes of moderate-intensity exercise (any activity that increases your heart rate and causes you to sweat) each week. In addition, most adults need muscle-strengthening exercises on 2 or more days a week.   Maintain a healthy weight. The body mass index (BMI) is a screening tool to identify possible weight problems. It provides an estimate of body fat based on height and weight. Your caregiver can help determine your BMI, and can help you achieve or maintain a healthy weight. For adults 20 years and older:  A BMI below 18.5 is considered underweight.  A BMI of 18.5 to 24.9 is normal.  A BMI of 25 to 29.9 is considered overweight.  A BMI of 30 and above is considered obese.  Maintain normal blood lipids and cholesterol by exercising and minimizing your intake of saturated fat. Eat a balanced diet with plenty of fruits and vegetables. Blood tests for lipids and cholesterol should begin at age 47 and be repeated every 5 years. If your lipid or  cholesterol levels are high, you are over 50, or you are a high risk for heart disease, you may need your cholesterol levels checked more frequently.Ongoing high lipid and cholesterol levels should be treated with medicines if diet and exercise are not effective.  If you smoke, find out from your caregiver how to quit. If you do not use tobacco, do not start.  Lung cancer screening is recommended for adults aged 97 80 years who are at high risk for developing lung cancer because of a history of smoking. Yearly low-dose computed tomography (CT) is recommended for people who have at least a 30-pack-year history of smoking and are a current smoker or have quit within the past 15 years. A pack year of smoking is smoking an average of 1 pack of cigarettes a day for 1 year (for example: 1 pack a day for 30 years or 2 packs a day for 15 years). Yearly screening should continue until the smoker has stopped smoking for at least 15 years. Yearly screening should also be stopped for people who develop a health problem that would prevent them from having lung cancer treatment.  If you are pregnant, do not drink alcohol. If you are breastfeeding, be very cautious about drinking alcohol. If you are not pregnant and choose to drink alcohol, do not exceed 1 drink per day. One drink is considered to be 12 ounces (355 mL) of beer, 5 ounces (148 mL) of wine, or 1.5 ounces (44 mL) of liquor.  Avoid use of street drugs. Do not share needles  with anyone. Ask for help if you need support or instructions about stopping the use of drugs.  High blood pressure causes heart disease and increases the risk of stroke. Blood pressure should be checked at least every 1 to 2 years. Ongoing high blood pressure should be treated with medicines, if weight loss and exercise are not effective.  If you are 55 to 57 years old, ask your caregiver if you should take aspirin to prevent strokes.  Diabetes screening involves taking a blood sample  to check your fasting blood sugar level. This should be done once every 3 years, after age 45, if you are within normal weight and without risk factors for diabetes. Testing should be considered at a younger age or be carried out more frequently if you are overweight and have at least 1 risk factor for diabetes.  Breast cancer screening is essential preventative care for women. You should practice "breast self-awareness." This means understanding the normal appearance and feel of your breasts and may include breast self-examination. Any changes detected, no matter how small, should be reported to a caregiver. Women in their 20s and 30s should have a clinical breast exam (CBE) by a caregiver as part of a regular health exam every 1 to 3 years. After age 40, women should have a CBE every year. Starting at age 40, women should consider having a mammogram (breast X-ray) every year. Women who have a family history of breast cancer should talk to their caregiver about genetic screening. Women at a high risk of breast cancer should talk to their caregiver about having an MRI and a mammogram every year.  Breast cancer gene (BRCA)-related cancer risk assessment is recommended for women who have family members with BRCA-related cancers. BRCA-related cancers include breast, ovarian, tubal, and peritoneal cancers. Having family members with these cancers may be associated with an increased risk for harmful changes (mutations) in the breast cancer genes BRCA1 and BRCA2. Results of the assessment will determine the need for genetic counseling and BRCA1 and BRCA2 testing.  The Pap test is a screening test for cervical cancer. Women should have a Pap test starting at age 21. Between ages 21 and 29, Pap tests should be repeated every 2 years. Beginning at age 30, you should have a Pap test every 3 years as long as the past 3 Pap tests have been normal. If you had a hysterectomy for a problem that was not cancer or a condition  that could lead to cancer, then you no longer need Pap tests. If you are between ages 65 and 70, and you have had normal Pap tests going back 10 years, you no longer need Pap tests. If you have had past treatment for cervical cancer or a condition that could lead to cancer, you need Pap tests and screening for cancer for at least 20 years after your treatment. If Pap tests have been discontinued, risk factors (such as a new sexual partner) need to be reassessed to determine if screening should be resumed. Some women have medical problems that increase the chance of getting cervical cancer. In these cases, your caregiver may recommend more frequent screening and Pap tests.  The human papillomavirus (HPV) test is an additional test that may be used for cervical cancer screening. The HPV test looks for the virus that can cause the cell changes on the cervix. The cells collected during the Pap test can be tested for HPV. The HPV test could be used to screen women aged   30 years and older, and should be used in women of any age who have unclear Pap test results. After the age of 30, women should have HPV testing at the same frequency as a Pap test.  Colorectal cancer can be detected and often prevented. Most routine colorectal cancer screening begins at the age of 50 and continues through age 75. However, your caregiver may recommend screening at an earlier age if you have risk factors for colon cancer. On a yearly basis, your caregiver may provide home test kits to check for hidden blood in the stool. Use of a small camera at the end of a tube, to directly examine the colon (sigmoidoscopy or colonoscopy), can detect the earliest forms of colorectal cancer. Talk to your caregiver about this at age 50, when routine screening begins. Direct examination of the colon should be repeated every 5 to 10 years through age 75, unless early forms of pre-cancerous polyps or small growths are found.  Hepatitis C blood testing is  recommended for all people born from 1945 through 1965 and any individual with known risks for hepatitis C.  Practice safe sex. Use condoms and avoid high-risk sexual practices to reduce the spread of sexually transmitted infections (STIs). Sexually active women aged 25 and younger should be checked for Chlamydia, which is a common sexually transmitted infection. Older women with new or multiple partners should also be tested for Chlamydia. Testing for other STIs is recommended if you are sexually active and at increased risk.  Osteoporosis is a disease in which the bones lose minerals and strength with aging. This can result in serious bone fractures. The risk of osteoporosis can be identified using a bone density scan. Women ages 65 and over and women at risk for fractures or osteoporosis should discuss screening with their caregivers. Ask your caregiver whether you should be taking a calcium supplement or vitamin D to reduce the rate of osteoporosis.  Menopause can be associated with physical symptoms and risks. Hormone replacement therapy is available to decrease symptoms and risks. You should talk to your caregiver about whether hormone replacement therapy is right for you.  Use sunscreen. Apply sunscreen liberally and repeatedly throughout the day. You should seek shade when your shadow is shorter than you. Protect yourself by wearing long sleeves, pants, a wide-brimmed hat, and sunglasses year round, whenever you are outdoors.  Notify your caregiver of new moles or changes in moles, especially if there is a change in shape or color. Also notify your caregiver if a mole is larger than the size of a pencil eraser.  Stay current with your immunizations. Document Released: 02/06/2011 Document Revised: 11/18/2012 Document Reviewed: 02/06/2011 [employer] Patient Information 2014 [employer].   

## 2014-03-04 NOTE — Telephone Encounter (Signed)
Pt called back and ok to use mail order. Refill sent.

## 2014-03-04 NOTE — Progress Notes (Signed)
Nancy Deleon 10-21-1955 [id number]        58 y.o.  G1P1 for annual exam.  Doing well without complaints.  Past medical history,surgical history, problem list, medications, allergies, family history and social history were all reviewed and documented as reviewed in the EPIC chart.  ROS:  12 system ROS performed with pertinent positives and negatives included in the history, assessment and plan.   Additional significant findings :  None   Exam: Kim assistant Filed Vitals:   03/04/14 0938  BP: 122/76  Height: 5\' 7"  (1.702 m)  Weight: 236 lb (107.049 kg)   General appearance:  Normal affect, orientation and appearance. Skin: Grossly normal HEENT: Without gross lesions.  No cervical or supraclavicular adenopathy. Thyroid normal.  Lungs:  Clear without wheezing, rales or rhonchi Cardiac: RR, without RMG Abdominal:  Soft, nontender, without masses, guarding, rebound, organomegaly or hernia Breasts:  Examined lying and sitting without masses, retractions, discharge or axillary adenopathy. Pelvic:  Ext/BUS/vagina normal with generalized atrophic changes  Cervix normal with atrophic changes. Pap/HPV  Uterus anteverted, normal size, shape and contour, midline and mobile nontender   Adnexa  Without masses or tenderness    Anus and perineum  Normal   Rectovaginal  Normal sphincter tone without palpated masses or tenderness.    Assessment/Plan:  58 y.o. G1P1 female for annual exam.   1. Postmenopausal/atrophic genital changes. Patient doing well without significant hot flushes, night sweats, vaginal dryness. Is not sexually active. No vaginal bleeding. Continue to monitor. Report any vaginal bleeding. 2. Pap smear 2012. Pap/HPV today. No history of significant abnormal Pap smears. Repeat at 3-5 year interval assuming this Pap smear normal. 3. Mammography 01/2014 normal. Continue with annual mammography. SBE monthly reviewed. 4. DEXA 2013 normal. Repeat at 5-10 year interval. Increased  calcium vitamin D reviewed. 5. Colonoscopy to the hospital 11. Repeat at their recommended interval. 6. Health maintenance. No blood work done as patient reports this done at her primary physician's office. Followup one year, sooner as needed.   Note: This document was prepared with digital dictation and possible smart phrase technology. Any transcriptional errors that result from this process are unintentional.   Anastasio Auerbach MD, 10:14 AM 03/04/2014

## 2014-03-04 NOTE — Telephone Encounter (Signed)
Received fax request from [facility] requesting refill of amlodipine. Left message on pt's voicemail to call back and verify request. Refill pended.

## 2014-03-05 LAB — CYTOLOGY - PAP

## 2014-03-05 LAB — URINALYSIS W MICROSCOPIC + REFLEX CULTURE
BILIRUBIN URINE: NEGATIVE
Casts: NONE SEEN
Crystals: NONE SEEN
Glucose, UA: NEGATIVE mg/dL
Hgb urine dipstick: NEGATIVE
KETONES UR: NEGATIVE mg/dL
Leukocytes, UA: NEGATIVE
Nitrite: NEGATIVE
Protein, ur: NEGATIVE mg/dL
Specific Gravity, Urine: 1.022 (ref 1.005–1.030)
UROBILINOGEN UA: 0.2 mg/dL (ref 0.0–1.0)
pH: 6 (ref 5.0–8.0)

## 2014-03-06 ENCOUNTER — Other Ambulatory Visit: Payer: Self-pay | Admitting: Gynecology

## 2014-03-06 LAB — URINE CULTURE: Colony Count: 80000

## 2014-03-06 MED ORDER — FLUCONAZOLE 200 MG PO TABS: 200.0000 mg | ORAL_TABLET | Freq: Every day | ORAL | Status: DC

## 2014-03-23 ENCOUNTER — Ambulatory Visit (INDEPENDENT_AMBULATORY_CARE_PROVIDER_SITE_OTHER): Payer: BC Managed Care – PPO | Admitting: Cardiovascular Disease

## 2014-03-23 ENCOUNTER — Encounter: Payer: Self-pay | Admitting: Cardiovascular Disease

## 2014-03-23 VITALS — BP 124/62 | HR 62 | Ht 67.0 in | Wt 238.4 lb

## 2014-03-23 DIAGNOSIS — I441 Atrioventricular block, second degree: Secondary | ICD-10-CM | POA: Insufficient documentation

## 2014-03-23 DIAGNOSIS — K219 Gastro-esophageal reflux disease without esophagitis: Secondary | ICD-10-CM

## 2014-03-23 NOTE — Assessment & Plan Note (Signed)
She was referred partially forwhat was thouht  to be a second degree AV block, Mobitz type II.    In fact the EKG really only shows a Mobitz type I block which is benign. She's never had any episodes of syncope.  She reports having some episodes of indigestion like chest pain but I do not think that these are cardiac. I've encouraged her to take her omeprazole on a daily basis. We discussed which foods and have it seemed to worsen reflux. She'll see her medical doctor back for repeat evaluation. I'll see her on an as-needed basis. She is to call me she has episodes of chest pain that are associated with exercise.

## 2014-03-23 NOTE — Assessment & Plan Note (Signed)
We will have her take her omeprazole and a daily basis. She'll see her primary medical doctor for followup.

## 2014-03-23 NOTE — Patient Instructions (Signed)
Your physician recommends that you continue on your current medications as directed. Please refer to the Current Medication list given to you today. Make sure you take your Omeprazole 40 MG 1 tablet Everyday  Dr Acie Fredrickson would like you to stay aware from Maceo Pro and Fatty foods  Your physician discussed the importance of regular exercise and recommended that you start or continue a regular exercise program for good health.  Exercise to Stay Healthy Exercise helps you become and stay healthy. EXERCISE IDEAS AND TIPS Choose exercises that:  You enjoy.  Fit into your day. You do not need to exercise really hard to be healthy. You can do exercises at a slow or medium level and stay healthy. You can:  Stretch before and after working out.  Try yoga, Pilates, or tai chi.  Lift weights.  Walk fast, swim, jog, run, climb stairs, bicycle, dance, or rollerskate.  Take aerobic classes. Exercises that burn about 150 calories:  Running 1  miles in 15 minutes.  Playing volleyball for 45 to 60 minutes.  Washing and waxing a car for 45 to 60 minutes.  Playing touch football for 45 minutes.  Walking 1  miles in 35 minutes.  Pushing a stroller 1  miles in 30 minutes.  Playing basketball for 30 minutes.  Raking leaves for 30 minutes.  Bicycling 5 miles in 30 minutes.  Walking 2 miles in 30 minutes.  Dancing for 30 minutes.  Shoveling snow for 15 minutes.  Swimming laps for 20 minutes.  Walking up stairs for 15 minutes.  Bicycling 4 miles in 15 minutes.  Gardening for 30 to 45 minutes.  Jumping rope for 15 minutes.  Washing windows or floors for 45 to 60 minutes. Document Released: 08/26/2010 Document Revised: 10/16/2011 Document Reviewed: 08/26/2010 [facility] Patient Information 2015 [place], [place]. This information is not intended to replace advice given to you by your health care provider. Make sure you discuss any questions you have with your health care  provider.  Your physician recommends that you schedule a follow-up appointment As Needed

## 2014-03-23 NOTE — Progress Notes (Signed)
     Nancy Deleon Date of Birth  07/05/1956       [facility] Office [address], [address], [place], [place]  [address]   [place], [place]  [address] [name]   Fax  [phone]     Fax [phone]  Problem List: 1. Chest pain / indigestion   History of Present Illness:  Nancy Deleon is a 58 yo who is referred for evaluation of some indigestion like chest pain. When she was at Dr. Jenne Campus office, she was noted to have one episode of weakness about heart block. She denies any episodes of syncope.  She swims on occasion.  She works at [employer].     Current Outpatient Prescriptions on File Prior to Visit  Medication Sig Dispense Refill  . amLODipine (NORVASC) 10 MG tablet Take 1 tablet (10 mg total) by mouth daily.  90 tablet  1  . Calcium Carbonate-Vitamin D (CALTRATE 600+D) 600-400 MG-UNIT per tablet Take 1 tablet by mouth 2 (two) times daily.       No current facility-administered medications on file prior to visit.    Allergies  Allergen Reactions  . Naproxen     REACTION: vomiting  . Ropinirole Nausea Only  . Vicodin [Hydrocodone-Acetaminophen] Nausea And Vomiting  . Propoxyphene N-Acetaminophen Nausea And Vomiting    Past Medical History  Diagnosis Date  . Hypertension   . Obesity     Past Surgical History  Procedure Laterality Date  . Tubal ligation    . Cesarean section  [id number]  . Breast biopsy  04/24/11    rigth breast    History  Smoking status  . Former Smoker  . Quit date: 04/18/1985  Smokeless tobacco  . Never Used    History  Alcohol Use  . Yes    Comment: glass of wine with meals - sometimes    Family History  Problem Relation Age of Onset  . Lymphoma Mother   . Cancer Mother     lymphoma  . Dementia Mother   . Cancer Father     lung  . Cancer Maternal Uncle     colon    Reviw of Systems:  Reviewed in the HPI.  All other systems are  negative.  Physical Exam: Blood pressure 124/62, pulse 62, height 5\' 7"  (1.702 m), weight 238 lb 6.4 oz (108.138 kg), last menstrual period 02/28/2007, SpO2 99.00%. Wt Readings from Last 3 Encounters:  03/23/14 238 lb 6.4 oz (108.138 kg)  03/04/14 236 lb (107.049 kg)  02/09/14 234 lb (106.142 kg)     General: Well developed, well nourished, in no acute distress.  Head: Normocephalic, atraumatic, sclera non-icteric, mucus membranes are moist,   Neck: Supple. Carotids are 2 + without bruits. No JVD ,    Lungs: Clear   Heart: RR, normal S1S2  Abdomen: Soft, non-tender, non-distended with normal bowel sounds.  Msk:  Strength and tone are normal   Extremities: No clubbing or cyanosis. No edema.  Distal pedal pulses are 2+ and equal    Neuro: CN II - XII intact.  Alert and oriented X 3.   Psych:  Normal   ECG: 02/09/14:  Sinus brady at 52.  2nd degree AV block , type I Desiree Hane)  Assessment / Plan:

## 2014-03-30 ENCOUNTER — Emergency Department (HOSPITAL_BASED_OUTPATIENT_CLINIC_OR_DEPARTMENT_OTHER)
Admission: EM | Admit: 2014-03-30 | Discharge: 2014-03-31 | Disposition: A | Discharge status: Home / Self Care | Payer: BC Managed Care – PPO | Attending: Emergency Medicine | Admitting: Emergency Medicine

## 2014-03-30 ENCOUNTER — Emergency Department (HOSPITAL_BASED_OUTPATIENT_CLINIC_OR_DEPARTMENT_OTHER): Payer: BC Managed Care – PPO

## 2014-03-30 ENCOUNTER — Encounter (HOSPITAL_BASED_OUTPATIENT_CLINIC_OR_DEPARTMENT_OTHER): Payer: Self-pay | Admitting: Emergency Medicine

## 2014-03-30 DIAGNOSIS — R079 Chest pain, unspecified: Secondary | ICD-10-CM | POA: Insufficient documentation

## 2014-03-30 DIAGNOSIS — Z87891 Personal history of nicotine dependence: Secondary | ICD-10-CM | POA: Diagnosis not present

## 2014-03-30 DIAGNOSIS — E669 Obesity, unspecified: Secondary | ICD-10-CM | POA: Insufficient documentation

## 2014-03-30 DIAGNOSIS — I10 Essential (primary) hypertension: Secondary | ICD-10-CM | POA: Insufficient documentation

## 2014-03-30 DIAGNOSIS — Z79899 Other long term (current) drug therapy: Secondary | ICD-10-CM | POA: Insufficient documentation

## 2014-03-30 DIAGNOSIS — R12 Heartburn: Secondary | ICD-10-CM | POA: Diagnosis not present

## 2014-03-30 LAB — CBC WITH DIFFERENTIAL/PLATELET
BASOS ABS: 0.1 10*3/uL (ref 0.0–0.1)
Basophils Relative: 1 % (ref 0–1)
EOS ABS: 0.2 10*3/uL (ref 0.0–0.7)
Eosinophils Relative: 3 % (ref 0–5)
HCT: 36 % (ref 36.0–46.0)
Hemoglobin: 12.8 g/dL (ref 12.0–15.0)
LYMPHS ABS: 2.4 10*3/uL (ref 0.7–4.0)
Lymphocytes Relative: 39 % (ref 12–46)
MCH: 26.2 pg (ref 26.0–34.0)
MCHC: 35.6 g/dL (ref 30.0–36.0)
MCV: 73.6 fL — AB (ref 78.0–100.0)
MONO ABS: 0.4 10*3/uL (ref 0.1–1.0)
Monocytes Relative: 7 % (ref 3–12)
Neutro Abs: 3.1 10*3/uL (ref 1.7–7.7)
Neutrophils Relative %: 50 % (ref 43–77)
Platelets: 167 10*3/uL (ref 150–400)
RBC: 4.89 MIL/uL (ref 3.87–5.11)
RDW: 13.6 % (ref 11.5–15.5)
WBC: 6.2 10*3/uL (ref 4.0–10.5)

## 2014-03-30 LAB — COMPREHENSIVE METABOLIC PANEL
ALBUMIN: 4.1 g/dL (ref 3.5–5.2)
ALT: 16 U/L (ref 0–35)
AST: 19 U/L (ref 0–37)
Alkaline Phosphatase: 73 U/L (ref 39–117)
Anion gap: 12 (ref 5–15)
BUN: 16 mg/dL (ref 6–23)
CALCIUM: 10.3 mg/dL (ref 8.4–10.5)
CO2: 28 mEq/L (ref 19–32)
CREATININE: 0.9 mg/dL (ref 0.50–1.10)
Chloride: 101 mEq/L (ref 96–112)
GFR calc Af Amer: 81 mL/min — ABNORMAL LOW (ref >90)
GFR calc non Af Amer: 70 mL/min — ABNORMAL LOW (ref >90)
Glucose, Bld: 120 mg/dL — ABNORMAL HIGH (ref 70–99)
Potassium: 3.9 mEq/L (ref 3.7–5.3)
SODIUM: 141 meq/L (ref 137–147)
TOTAL PROTEIN: 8.3 g/dL (ref 6.0–8.3)
Total Bilirubin: 0.4 mg/dL (ref 0.3–1.2)

## 2014-03-30 LAB — LIPASE, BLOOD: Lipase: 32 U/L (ref 11–59)

## 2014-03-30 LAB — TROPONIN I

## 2014-03-30 NOTE — ED Provider Notes (Signed)
CSN: [id number]     Arrival date & time 03/30/14  2031 History   First MD Initiated Contact with Patient 03/30/14 2144     Chief Complaint  Patient presents with  . Chest Pain     (Consider location/radiation/quality/duration/timing/severity/associated sxs/prior Treatment) Patient is a 58 y.o. female presenting with chest pain. The history is provided by the patient. No language interpreter was used.  Chest Pain Pain location:  Epigastric Pain quality: aching   Pain radiates to:  Does not radiate Pain radiates to the back: no   Pain severity:  Severe Onset quality:  Gradual Duration:  3 days Timing:  Constant Progression:  Worsening Chronicity:  New Context: not breathing and no movement   Relieved by:  Nothing Worsened by:  Nothing tried Ineffective treatments:  None tried Associated symptoms: heartburn   Associated symptoms: no abdominal pain   Risk factors: no aortic disease and no smoking     Past Medical History  Diagnosis Date  . Hypertension   . Obesity    Past Surgical History  Procedure Laterality Date  . Tubal ligation    . Cesarean section  [id number]  . Breast biopsy  04/24/11    rigth breast   Family History  Problem Relation Age of Onset  . Lymphoma Mother   . Cancer Mother     lymphoma  . Dementia Mother   . Cancer Father     lung  . Cancer Maternal Uncle     colon   History  Substance Use Topics  . Smoking status: Former Smoker    Quit date: 04/18/1985  . Smokeless tobacco: Never Used  . Alcohol Use: Yes     Comment: occ   OB History   Grav Para Term Preterm Abortions TAB SAB Ect Mult Living   1 1        1      Review of Systems  Cardiovascular: Positive for chest pain.  Gastrointestinal: Positive for heartburn. Negative for abdominal pain.  All other systems reviewed and are negative.     Allergies  Naproxen; Ropinirole; Vicodin; and Propoxyphene n-acetaminophen  Home Medications   Prior to Admission medications    Medication Sig Start Date End Date Taking? Authorizing Provider  amLODipine (NORVASC) 10 MG tablet Take 1 tablet (10 mg total) by mouth daily. 03/04/14   [name], NP  Calcium Carbonate-Vitamin D (CALTRATE 600+D) 600-400 MG-UNIT per tablet Take 1 tablet by mouth 2 (two) times daily.    Historical Provider, MD  omeprazole (PRILOSEC) 40 MG capsule Take 40 mg by mouth daily.    Historical Provider, MD   BP 155/71  Pulse 58  Temp(Src) 98.3 F (36.8 C) (Oral)  Resp 20  Ht 5\' 6"  (1.676 m)  Wt 234 lb (106.142 kg)  BMI 37.79 kg/m2  SpO2 99%  LMP 02/28/2007 Physical Exam  Nursing note and vitals reviewed. Constitutional: She is oriented to person, place, and time. She appears well-developed and well-nourished.  HENT:  Head: Normocephalic.  Eyes: Conjunctivae and EOM are normal. Pupils are equal, round, and reactive to light.  Neck: Normal range of motion.  Cardiovascular: Normal rate and regular rhythm.   Pulmonary/Chest: Effort normal and breath sounds normal.  Abdominal: Soft. She exhibits no distension.  Musculoskeletal: Normal range of motion.  Neurological: She is alert and oriented to person, place, and time.  Skin: Skin is warm.  Psychiatric: She has a normal mood and affect.    ED Course  Procedures (including critical care time)  Labs Review Labs Reviewed  CBC WITH DIFFERENTIAL - Abnormal; Notable for the following:    MCV 73.6 (*)    All other components within normal limits  COMPREHENSIVE METABOLIC PANEL - Abnormal; Notable for the following:    Glucose, Bld 120 (*)    GFR calc non Af Amer 70 (*)    GFR calc Af Amer 81 (*)    All other components within normal limits  TROPONIN I  LIPASE, BLOOD    Imaging Review Dg Chest 2 View  03/30/2014   CLINICAL DATA:  Two days central chest pain radiating to back  EXAM: CHEST  2 VIEW  COMPARISON:  None.  FINDINGS: The heart size and mediastinal contours are within normal limits. Both lungs are clear. The visualized  skeletal structures are unremarkable.  IMPRESSION: No active cardiopulmonary disease.   Electronically Signed   By: Skipper Cliche M.D.   On: 03/30/2014 21:53     EKG Interpretation None    Ekg  Bradycardia 58  No st elevation,   Results for orders placed during the hospital encounter of 03/30/14  CBC WITH DIFFERENTIAL      Result Value Ref Range   WBC 6.2  4.0 - 10.5 K/uL   RBC 4.89  3.87 - 5.11 MIL/uL   Hemoglobin 12.8  12.0 - 15.0 g/dL   HCT 36.0  36.0 - 46.0 %   MCV 73.6 (*) 78.0 - 100.0 fL   MCH 26.2  26.0 - 34.0 pg   MCHC 35.6  30.0 - 36.0 g/dL   RDW 13.6  11.5 - 15.5 %   Platelets 167  150 - 400 K/uL   Neutrophils Relative % 50  43 - 77 %   Lymphocytes Relative 39  12 - 46 %   Monocytes Relative 7  3 - 12 %   Eosinophils Relative 3  0 - 5 %   Basophils Relative 1  0 - 1 %   Neutro Abs 3.1  1.7 - 7.7 K/uL   Lymphs Abs 2.4  0.7 - 4.0 K/uL   Monocytes Absolute 0.4  0.1 - 1.0 K/uL   Eosinophils Absolute 0.2  0.0 - 0.7 K/uL   Basophils Absolute 0.1  0.0 - 0.1 K/uL   Smear Review LARGE PLATELETS PRESENT    COMPREHENSIVE METABOLIC PANEL      Result Value Ref Range   Sodium 141  137 - 147 mEq/L   Potassium 3.9  3.7 - 5.3 mEq/L   Chloride 101  96 - 112 mEq/L   CO2 28  19 - 32 mEq/L   Glucose, Bld 120 (*) 70 - 99 mg/dL   BUN 16  6 - 23 mg/dL   Creatinine, Ser 0.90  0.50 - 1.10 mg/dL   Calcium 10.3  8.4 - 10.5 mg/dL   Total Protein 8.3  6.0 - 8.3 g/dL   Albumin 4.1  3.5 - 5.2 g/dL   AST 19  0 - 37 U/L   ALT 16  0 - 35 U/L   Alkaline Phosphatase 73  39 - 117 U/L   Total Bilirubin 0.4  0.3 - 1.2 mg/dL   GFR calc non Af Amer 70 (*) >90 mL/min   GFR calc Af Amer 81 (*) >90 mL/min   Anion gap 12  5 - 15  TROPONIN I      Result Value Ref Range   Troponin I <0.30  <0.30 ng/mL  LIPASE, BLOOD      Result Value Ref Range  Lipase 32  11 - 59 U/L  TROPONIN I      Result Value Ref Range   Troponin I <0.30  <0.30 ng/mL   Dg Chest 2 View  03/30/2014   CLINICAL DATA:  Two  days central chest pain radiating to back  EXAM: CHEST  2 VIEW  COMPARISON:  None.  FINDINGS: The heart size and mediastinal contours are within normal limits. Both lungs are clear. The visualized skeletal structures are unremarkable.  IMPRESSION: No active cardiopulmonary disease.   Electronically Signed   By: Skipper Cliche M.D.   On: 03/30/2014 21:53    MDM Troponin negative x 2.   Pt saw cardiologist for same on 8/17.   Pt has appointment to see Earlie Counts on Wednesday.     Final diagnoses:  Chest pain, unspecified chest pain type        Fransico Meadow, PA-C 03/31/14 [place], [place] 03/31/14 [phone]

## 2014-03-30 NOTE — ED Notes (Signed)
PA at bedside.

## 2014-03-30 NOTE — ED Notes (Signed)
C/o right side CP and upper back pain x "few days"-sent from urgent care for eval

## 2014-03-31 ENCOUNTER — Telehealth: Payer: Self-pay | Admitting: Family

## 2014-03-31 LAB — TROPONIN I: Troponin I: <0.3 ng/mL (ref <0.30)

## 2014-03-31 MED ORDER — OXYCODONE-ACETAMINOPHEN 5-325 MG PO TABS
2.0000 | ORAL_TABLET | Freq: Once | ORAL | Status: AC
  Administered 2014-03-31: 2 via ORAL
  Filled 2014-03-31: qty 2

## 2014-03-31 MED ORDER — OMEPRAZOLE 20 MG PO CPDR: 20.0000 mg | DELAYED_RELEASE_CAPSULE | Freq: Every day | ORAL | Status: DC

## 2014-03-31 NOTE — Telephone Encounter (Signed)
Appointment scheduled.

## 2014-03-31 NOTE — ED Notes (Signed)
PA at bedside discussing test results and dispo plan of care. 

## 2014-03-31 NOTE — Telephone Encounter (Signed)
Left message for patient to return my call.

## 2014-03-31 NOTE — Telephone Encounter (Signed)
Please call pt to arrange ED follow up.

## 2014-03-31 NOTE — Discharge Instructions (Signed)

## 2014-04-01 ENCOUNTER — Encounter: Payer: Self-pay | Admitting: Podiatry

## 2014-04-01 ENCOUNTER — Ambulatory Visit (INDEPENDENT_AMBULATORY_CARE_PROVIDER_SITE_OTHER): Payer: BC Managed Care – PPO | Admitting: Podiatry

## 2014-04-01 ENCOUNTER — Encounter: Payer: Self-pay | Admitting: Family

## 2014-04-01 ENCOUNTER — Ambulatory Visit (INDEPENDENT_AMBULATORY_CARE_PROVIDER_SITE_OTHER): Payer: BC Managed Care – PPO | Admitting: Family

## 2014-04-01 VITALS — BP 110/68 | HR 65 | Resp 16

## 2014-04-01 VITALS — BP 126/80 | HR 61 | Temp 98.2°F | Resp 16 | Ht 67.5 in | Wt 234.1 lb

## 2014-04-01 DIAGNOSIS — Z23 Encounter for immunization: Secondary | ICD-10-CM

## 2014-04-01 DIAGNOSIS — M7750 Other enthesopathy of unspecified foot: Secondary | ICD-10-CM

## 2014-04-01 DIAGNOSIS — R1013 Epigastric pain: Secondary | ICD-10-CM | POA: Insufficient documentation

## 2014-04-01 MED ORDER — ESOMEPRAZOLE MAGNESIUM 40 MG PO CPDR: 40.0000 mg | DELAYED_RELEASE_CAPSULE | Freq: Every day | ORAL | Status: DC

## 2014-04-01 MED ORDER — TRIAMCINOLONE ACETONIDE 10 MG/ML IJ SUSP
10.0000 mg | Freq: Once | INTRAMUSCULAR | Status: AC
  Administered 2014-04-01: 10 mg

## 2014-04-01 NOTE — Progress Notes (Signed)
Subjective:     Patient ID: Nancy Deleon, female   DOB: [birth year], 58 y.o.   MRN: [id number]  HPI patient states that this left ankle is starting to hurt me again and I did real well for about a year   Review of Systems     Objective:   Physical Exam Neurovascular status intact with pain and discomfort in the left sinus tarsi    Assessment:     Inflammatory sinus tarsitis left    Plan:     Injected the left sinus tarsi 3 mg Kenalog 5 mg Xylocaine Marcaine mixture

## 2014-04-01 NOTE — Assessment & Plan Note (Signed)
I am suspicious of gallbladder etiology. Obtain abdominal US for further evaluation. If [place] negative, will likely need GI referral. Continue PPI. She plans to switch to nexium.

## 2014-04-01 NOTE — Progress Notes (Signed)
Pre visit review using our clinic review tool, if applicable. No additional management support is needed unless otherwise documented below in the visit note. 

## 2014-04-01 NOTE — Progress Notes (Signed)
Subjective:    Patient ID: Nancy Deleon, female    DOB: 02-Aug-1956, 58 y.o.   MRN: [id number]  HPI  [name] is a 58 yr old female who presents today for ED follow up. She was seen in the ED on 03/30/14 with complaint of epigastric pain.  Records are reviewed. She underwent serial cardiac enzymes which were neg x 2.  CXR at that time was unremarkable.   She did see Dr. Acie Fredrickson on 03/23/14. She reported to him episodes of chest pain which he felt were more likely associated with indigestion and he advised her to continue her omeprazole on a daily basis and to follow up with PCP.   She reports that pain continues.  Pain radiates from the epigastric area into the back. Pain is worse after fatty meals.  Not improved thus far with use of PPI.      Review of Systems See HPI  Past Medical History  Diagnosis Date  . Hypertension   . Obesity     History   Social History  . Marital Status: Widowed    Spouse Name: N/A    Number of Children: 2  . Years of Education: N/A   Occupational History  .     Social History Main Topics  . Smoking status: Former Smoker    Quit date: 04/18/1985  . Smokeless tobacco: Never Used  . Alcohol Use: Yes     Comment: occ  . Drug Use: No  . Sexual Activity: Not on file   Other Topics Concern  . Not on file   Social History Narrative   Regular exercise:  Walks 2-5 x weekly   Caffeine Use: no   Widowed   She has a son Nancy Deleon- he is 74 yrs old.   Foster parent.   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   Dog (Lab named Abby)             Past Surgical History  Procedure Laterality Date  . Tubal ligation    . Cesarean section  [id number]  . Breast biopsy  04/24/11    rigth breast    Family History  Problem Relation Age of Onset  . Lymphoma Mother   . Cancer Mother     lymphoma  . Dementia Mother   . Cancer Father     lung  . Cancer Maternal Uncle     colon     Allergies  Allergen Reactions  . Naproxen     REACTION: vomiting  . Ropinirole Nausea Only  . Vicodin [Hydrocodone-Acetaminophen] Nausea And Vomiting  . Propoxyphene N-Acetaminophen Nausea And Vomiting    Current Outpatient Prescriptions on File Prior to Visit  Medication Sig Dispense Refill  . amLODipine (NORVASC) 10 MG tablet Take 1 tablet (10 mg total) by mouth daily.  90 tablet  1  . Calcium Carbonate-Vitamin D (CALTRATE 600+D) 600-400 MG-UNIT per tablet Take 1 tablet by mouth 2 (two) times daily.       No current facility-administered medications on file prior to visit.    BP 126/80  Pulse 61  Temp(Src) 98.2 F (36.8 C) (Oral)  Resp 16  Ht 5' 7.5" (1.715 m)  Wt 234 lb 1.3 oz (106.178 kg)  BMI 36.10 kg/m2  SpO2 97%  LMP 02/28/2007       Objective:   Physical Exam  Constitutional: She is oriented to person, place, and time.  She appears well-developed and well-nourished. No distress.  Cardiovascular: Normal rate and regular rhythm.   No murmur heard. Pulmonary/Chest: Effort normal and breath sounds normal. No respiratory distress. She has no wheezes. She has no rales. She exhibits no tenderness.  Abdominal: Soft. Bowel sounds are normal.  Mild epigastric/ruq tenderness to palpation  Neurological: She is alert and oriented to person, place, and time.  Psychiatric: She has a normal mood and affect. Her behavior is normal. Judgment and thought content normal.          Assessment & Plan:

## 2014-04-01 NOTE — Patient Instructions (Addendum)
You will be contacted about your abdominal ultrasound.  We will contact you with your results.

## 2014-04-01 NOTE — ED Provider Notes (Signed)
History/physical exam/procedure(s) were performed by non-physician practitioner and as supervising physician I was immediately available for consultation/collaboration. I have reviewed all notes and am in agreement with care and plan.   Shaune Pollack, MD 04/01/14 [phone]

## 2014-04-02 ENCOUNTER — Ambulatory Visit (HOSPITAL_BASED_OUTPATIENT_CLINIC_OR_DEPARTMENT_OTHER)
Admission: RE | Admit: 2014-04-02 | Discharge: 2014-04-02 | Disposition: A | Discharge status: Home / Self Care | Payer: BC Managed Care – PPO | Source: Ambulatory Visit | Attending: Family | Admitting: Family

## 2014-04-02 DIAGNOSIS — R1013 Epigastric pain: Secondary | ICD-10-CM | POA: Insufficient documentation

## 2014-04-02 DIAGNOSIS — K7689 Other specified diseases of liver: Secondary | ICD-10-CM | POA: Diagnosis not present

## 2014-04-02 DIAGNOSIS — N281 Cyst of kidney, acquired: Secondary | ICD-10-CM | POA: Diagnosis not present

## 2014-04-03 ENCOUNTER — Telehealth: Payer: Self-pay | Admitting: Family

## 2014-04-03 ENCOUNTER — Encounter: Payer: Self-pay | Admitting: Family

## 2014-04-03 DIAGNOSIS — K760 Fatty (change of) liver, not elsewhere classified: Secondary | ICD-10-CM | POA: Insufficient documentation

## 2014-04-03 DIAGNOSIS — R1013 Epigastric pain: Secondary | ICD-10-CM

## 2014-04-03 HISTORY — DX: Fatty (change of) liver, not elsewhere classified: K76.0

## 2014-04-03 NOTE — Telephone Encounter (Signed)
Please contact pt and let her know that GB looks ok, possible polyp noted but this is unlikely cause for her pain.  Note is made of fatty liver.  Advise low fat/low cholesterol diet, exercise.  I would like to refer her to GI for further evaluation.

## 2014-04-06 NOTE — Telephone Encounter (Signed)
Caller name: Aman Relation to pt: Call back number:[phone]   Reason for call:  Pt had x-rays done 8/27 and would like the results.  Thanks.

## 2014-04-07 NOTE — Telephone Encounter (Signed)
Notified pt and she is agreeable to proceed with GI referral.

## 2014-04-14 ENCOUNTER — Telehealth: Payer: Self-pay | Admitting: *Deleted

## 2014-04-14 ENCOUNTER — Encounter: Payer: Self-pay | Admitting: Gastroenterology

## 2014-04-14 ENCOUNTER — Telehealth: Payer: Self-pay | Admitting: Family

## 2014-04-14 NOTE — Telephone Encounter (Signed)
Pt called stating she was able to get appt with PA on 04/24/14 for her eppigastric pain. States that Nexium is not working. States epigastric pain continues, no worse but NO BETTER. Chest feels heavy after eating. Pt very upset that it is taking so long to get appt with GI and that she still has not had any relief of her symptoms with Nexium.  Pt was very upset and requested call back ASAP. Advised pt that [facility] is out of the office for the day and it may be tomorrow before I can call her back.  Please advise.

## 2014-04-14 NOTE — Telephone Encounter (Signed)
Pt is following up on her appt with a GI doctor, states Melissa was going to get back with her but she has not heard anything.

## 2014-04-14 NOTE — Telephone Encounter (Signed)
Nancy Deleon, Nancy Deleon is requesting a sooner appointment. She is scheduled to meet you on 9/18.  Do you have an earlier appointment by chance please? Thanks, [employer]

## 2014-04-14 NOTE — Telephone Encounter (Signed)
Spoke with pt and provided her with GI phone # to contact re: appt.

## 2014-04-15 ENCOUNTER — Encounter: Payer: Self-pay | Admitting: Physician Assistant

## 2014-04-15 ENCOUNTER — Ambulatory Visit (INDEPENDENT_AMBULATORY_CARE_PROVIDER_SITE_OTHER): Payer: BC Managed Care – PPO | Admitting: Physician Assistant

## 2014-04-15 ENCOUNTER — Ambulatory Visit (INDEPENDENT_AMBULATORY_CARE_PROVIDER_SITE_OTHER)
Admission: RE | Admit: 2014-04-15 | Discharge: 2014-04-15 | Disposition: A | Discharge status: Home / Self Care | Payer: BC Managed Care – PPO | Source: Ambulatory Visit | Attending: Physician Assistant | Admitting: Physician Assistant

## 2014-04-15 VITALS — BP 132/80 | HR 68 | Ht 67.5 in | Wt 233.6 lb

## 2014-04-15 DIAGNOSIS — R079 Chest pain, unspecified: Secondary | ICD-10-CM

## 2014-04-15 DIAGNOSIS — R1319 Other dysphagia: Secondary | ICD-10-CM

## 2014-04-15 DIAGNOSIS — R1013 Epigastric pain: Secondary | ICD-10-CM

## 2014-04-15 MED ORDER — IOHEXOL 300 MG/ML  SOLN
100.0000 mL | Freq: Once | INTRAMUSCULAR | Status: AC | PRN
  Administered 2014-04-15: 100 mL via INTRAVENOUS

## 2014-04-15 MED ORDER — SUCRALFATE 1 GM/10ML PO SUSP: 1.0000 g | Freq: Three times a day (TID) | ORAL | Status: DC

## 2014-04-15 NOTE — Telephone Encounter (Signed)
Spoke with patient and scheduled her with Nicoletta Ba, PA today at 2:00 PM.

## 2014-04-15 NOTE — Progress Notes (Signed)
Subjective:    Patient ID: Nancy Deleon, female    DOB: 21-Dec-1955, 58 y.o.   MRN: [id number]  HPI  [name] is a pleasant 58 year old African American female referred by [name]/primary care with complaints of epigastric pain. She is known to very remotely to Dr. Sharlett Iles and says she had an endoscopy more than 15 years ago. Patient has had a screening colonoscopy done at [facility] a couple of years ago and was told this was negative and she did not need to return for 10 years. She states she's been having epigastric pain over the past 3 weeks. Her pain is fairly constant and seems to be exacerbated postprandially. She says the pain is located high in her epigastrium and radiates straight through to her back and is sharp in nature. She's had some vague nausea but no vomiting. She has no complaints of dysphagia or diet aphasia no heartburn or indigestion. She says she scared to eat at this point because she always feels worse after eating. Not had any significant weight loss. She has been awakened at night by the pain. Her bowel habits have been normal she has had no melena or hematochezia. No fever or chills. She is not on any regular aspirin or NSAIDs. She has been on Nexium over the past week and thus far has not noticed any change. Workup by primary care with negative chest x-ray. She had an upper abdominal ultrasound which did show a 5.4 cm cyst in the dome of the right lobe of the liver and evidence of steatosis. Possibly a tiny 3 mm stone in the gallbladder. CBC was unremarkable and LFTs are normal.    Review of Systems  Constitutional: Positive for appetite change.  HENT: Negative.   Eyes: Negative.   Respiratory: Negative.   Cardiovascular: Negative.   Gastrointestinal: Positive for nausea and abdominal pain.  Endocrine: Negative.   Genitourinary: Negative.   Allergic/Immunologic: Negative.   Neurological: Negative.   Hematological: Negative.   Psychiatric/Behavioral:  Negative.    Outpatient Prescriptions Prior to Visit  Medication Sig Dispense Refill  . amLODipine (NORVASC) 10 MG tablet Take 1 tablet (10 mg total) by mouth daily.  90 tablet  1  . Calcium Carbonate-Vitamin D (CALTRATE 600+D) 600-400 MG-UNIT per tablet Take 1 tablet by mouth 2 (two) times daily.      Marland Kitchen esomeprazole (NEXIUM) 40 MG capsule Take 1 capsule (40 mg total) by mouth daily.  90 capsule  0  . sucralfate (CARAFATE) 1 GM/10ML suspension Take 10 mLs (1 g total) by mouth 4 (four) times daily -  with meals and at bedtime.  420 mL  0  . alum & mag hydroxide-simeth (MAALOX/MYLANTA) 200-200-20 MG/5ML suspension Take 30 mLs by mouth.       No facility-administered medications prior to visit.   Allergies  Allergen Reactions  . Naproxen     REACTION: vomiting  . Ropinirole Nausea Only  . Vicodin [Hydrocodone-Acetaminophen] Nausea And Vomiting  . Propoxyphene N-Acetaminophen Nausea And Vomiting   Patient Active Problem List   Diagnosis Date Noted  . Fatty liver 04/03/2014  . Abdominal pain, epigastric 04/01/2014  . Second degree AV block, Mobitz type I 03/23/2014  . GERD (gastroesophageal reflux disease) 02/09/2014  . Nonspecific abnormal electrocardiogram (ECG) (EKG) 02/09/2014  . Pain in joint, ankle and foot 11/17/2013  . Headache(784.0) 08/15/2013  . Routine general medical examination at a health care facility 02/21/2012  . Eczema of face 12/18/2011  . Seasonal allergies 11/21/2011  .  ANEMIA, IRON DEFICIENCY 12/30/2008  . RESTLESS LEGS SYNDROME 11/01/2007  . Fasting hyperglycemia 10/31/2007  . HYPERTENSION 10/31/2007   History   Social History Narrative   Regular exercise:  Walks 2-5 x weekly   Caffeine Use: no   Widowed   She has a son Nancy Deleon- he is 65 yrs old.   Foster parent.   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   Dog (Lab named Abby)            family history includes Cancer in her father, maternal  uncle, and mother; Dementia in her mother; Lymphoma in her mother.     Objective:   Physical Exam  well-developed African American female in no acute distress, pleasant blood pressure 132/80 pulse 68 height 5 foot 7 weight 233. HEENT; nontraumatic normocephalic EOMI PERRLA sclera anicteric, Supple; no JVD, Cardiovascular; regular rate and rhythm with S1-S2 no murmur or gallop, Pulmonary; clear bilaterally, Abdomen; soft she has mild tenderness in the epigastrium there is no guarding or rebound no palpable mass or hepatosplenomegaly bowel sounds are present, Rectal; exam not done, Extremities; no clubbing cyanosis or edema skin warm and dry, Psych ;mood and affect appropriate        Assessment & Plan:  #70  58 year old Afro-American female with 3 week history of epigastric pain radiating to the back. No evidence for cholecystitis or choledocholithiasis by recent ultrasound and labs. Lipase was normal Rule out gastropathy or peptic ulcer disease, rule out other intra-abdominal inflammatory process #2   5.4 cm hepatic cyst dome of the right hepatic lobe-doubt as etiology for her current pain at though these can be symptomatic. #3 colon neoplasia surveillance patient had colonoscopy at [facility] within the past 2-3 years negative #4 hepatic steatosis #5 hypertension  Plan; patient will continue Nexium 40 mg by mouth every morning Schedule for EGD with Dr. Ronalee Belts will be established with Dr. Fuller Plan, procedure discussed in detail with the patient and she is agreeable to proceed Will also schedule for CT scan of the abdomen and pelvis, this will also allow further look at hepatic cyst. Patient should have repeat upper abdominal ultrasound in 3-4 months to assess for stability of the size of the hepatic cyst as well.

## 2014-04-15 NOTE — Telephone Encounter (Signed)
Please contact pt and let her know that I have sent rx for carafate to her pharmacy to help coat her stomach and hopefully provide her with some relief.  I have also sent a request to the PA who will be seeing her to see if she has any earlier appointments available. In the meantime, if she feels that she needs evaluation sooner she can schedule follow up in our office or go to ER if symptoms are severe.

## 2014-04-15 NOTE — Telephone Encounter (Signed)
Left detailed message on cell and to call if any questions. 

## 2014-04-15 NOTE — Patient Instructions (Signed)
You have been scheduled for an endoscopy. Please follow written instructions given to you at your visit today. If you use inhalers (even only as needed), please bring them with you on the day of your procedure. Your physician has requested that you go to www.startemmi.com and enter the access code given to you at your visit today. This web site gives a general overview about your procedure. However, you should still follow specific instructions given to you by our office regarding your preparation for the procedure.   You have been scheduled for a CT scan of the abdomen and pelvis at [facility] ([address]---this is in the same building as Press [facility]).   You are scheduled on Wed today  at 4:20 PM. You should arrive at 4:00 PM  to your appointment time for registration. Please follow the written instructions below on the day of your exam:  WARNING: IF YOU ARE ALLERGIC TO IODINE/X-RAY DYE, PLEASE NOTIFY RADIOLOGY IMMEDIATELY AT [id number]! YOU WILL BE GIVEN A 13 HOUR PREMEDICATION PREP.  1) Do not eat or drink anything until after your CT scan today. 2) You have been given 2 bottles of oral contrast to drink. The solution may taste better if refrigerated, but do NOT add ice or any other liquid to this solution. Shake  well before drinking.    Drink 1 bottle of contrast @ 2:50 PM (2 hours prior to your exam)  Drink 1 bottle of contrast @ 3:30 PM (1 hour prior to your exam)  You may take any medications as prescribed with a small amount of water except for the following: Metformin, Glucophage, Glucovance, Avandamet, Riomet, Fortamet, Actoplus Met, Janumet, Glumetza or Metaglip. The above medications must be held the day of the exam AND 48 hours after the exam.  The purpose of you drinking the oral contrast is to aid in the visualization of your intestinal tract. The contrast solution may cause some diarrhea. Before your exam is started, you will be given a small amount of  fluid to drink. Depending on your individual set of symptoms, you may also receive an intravenous injection of x-ray contrast/dye. Plan on being at [facility] for 30 minutes or long, depending on the type of exam you are having performed.  If you have any questions regarding your exam or if you need to reschedule, you may call the CT department at [phone] between the hours of 8:00 am and 5:00 pm, Monday-Friday.  ________________________________________________________________________

## 2014-04-15 NOTE — Telephone Encounter (Signed)
I will have my nurse address this and see if we can get her in sooner.  Thank you,  Janett Billow

## 2014-04-15 NOTE — Progress Notes (Signed)
Reviewed and agree with management plan.  Trudie Cervantes T. Glenyce Randle, MD FACG 

## 2014-04-15 NOTE — Telephone Encounter (Signed)
Will you please contact this patient and see if myself or someone else has a sooner visit?  Thank you,  Jess

## 2014-04-20 ENCOUNTER — Ambulatory Visit (AMBULATORY_SURGERY_CENTER): Payer: BC Managed Care – PPO | Admitting: Gastroenterology

## 2014-04-20 ENCOUNTER — Encounter: Payer: Self-pay | Admitting: Gastroenterology

## 2014-04-20 VITALS — BP 136/76 | HR 68 | Temp 96.8°F | Resp 16 | Ht 67.5 in | Wt 233.0 lb

## 2014-04-20 DIAGNOSIS — R079 Chest pain, unspecified: Secondary | ICD-10-CM

## 2014-04-20 DIAGNOSIS — R1013 Epigastric pain: Secondary | ICD-10-CM

## 2014-04-20 MED ORDER — ESOMEPRAZOLE MAGNESIUM 40 MG PO CPDR: DELAYED_RELEASE_CAPSULE | ORAL | Status: DC

## 2014-04-20 MED ORDER — SODIUM CHLORIDE 0.9 % IV SOLN: 500.0000 mL | INTRAVENOUS | Status: DC

## 2014-04-20 NOTE — Progress Notes (Signed)
Procedure ends, to recovery, report given and VSS. 

## 2014-04-20 NOTE — Op Note (Signed)
[place]  [address] [place], [address]   ENDOSCOPY PROCEDURE REPORT  PATIENT: Nancy, Deleon  MR#: [id number] BIRTHDATE: 1956-04-05 , 58  yrs. old GENDER: Female ENDOSCOPIST: Ladene Artist, MD, [facility] PROCEDURE DATE:  04/20/2014 PROCEDURE:  EGD, diagnostic ASA CLASS:     Class II INDICATIONS:  Epigastric pain.   Chest pain. MEDICATIONS: MAC sedation, administered by CRNA, propofol (Diprivan) 150mg  IV, and Robinul 0.2 mg IV TOPICAL ANESTHETIC: none DESCRIPTION OF PROCEDURE: After the risks benefits and alternatives of the procedure were thoroughly explained, informed consent was obtained.  The LB [id number] endoscope was introduced through the mouth and advanced to the second portion of the duodenum. Without limitations.  The instrument was slowly withdrawn as the mucosa was fully examined.    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  The stenosis was traversable with the endoscope.   The esophagus was otherwise normal. STOMACH: The mucosa and folds of the stomach appeared normal. DUODENUM: The duodenal mucosa showed no abnormalities in the bulb and second portion of the duodenum.  Retroflexed views revealed a small hiatal hernia.  The scope was then withdrawn from the patient and the procedure completed.  COMPLICATIONS: There were no complications.  ENDOSCOPIC IMPRESSION: 1.   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   Small hiatal hernia  RECOMMENDATIONS: 1.  Anti-reflux regimen 2.  PPI bid: Nexium 40 mg po bid, 1 year of refills 3.  Schedule CCK HIDA 4.  Office appt in 4 weeks  eSigned:  Ladene Artist, MD, [facility] 04/20/2014 11:01 AM

## 2014-04-20 NOTE — Patient Instructions (Signed)
Discharge instructions given with verbal understanding. Handout on a hiatal hernia. Office will schedule CCK HIDA  Office will call and schedule visit in 4 weeks. Prescription sent in to pharmacy. Resume previous medications. YOU HAD AN ENDOSCOPIC PROCEDURE TODAY AT [facility]: Refer to the procedure report that was given to you for any specific questions about what was found during the examination.  If the procedure report does not answer your questions, please call your gastroenterologist to clarify.  If you requested that your care partner not be given the details of your procedure findings, then the procedure report has been included in a sealed envelope for you to review at your convenience later.  YOU SHOULD EXPECT: Some feelings of bloating in the abdomen. Passage of more gas than usual.  Walking can help get rid of the air that was put into your GI tract during the procedure and reduce the bloating. If you had a lower endoscopy (such as a colonoscopy or flexible sigmoidoscopy) you may notice spotting of blood in your stool or on the toilet paper. If you underwent a bowel prep for your procedure, then you may not have a normal bowel movement for a few days.  DIET: Your first meal following the procedure should be a light meal and then it is ok to progress to your normal diet.  A half-sandwich or bowl of soup is an example of a good first meal.  Heavy or fried foods are harder to digest and may make you feel nauseous or bloated.  Likewise meals heavy in dairy and vegetables can cause extra gas to form and this can also increase the bloating.  Drink plenty of fluids but you should avoid alcoholic beverages for 24 hours.  ACTIVITY: Your care partner should take you home directly after the procedure.  You should plan to take it easy, moving slowly for the rest of the day.  You can resume normal activity the day after the procedure however you should NOT DRIVE or use heavy machinery  for 24 hours (because of the sedation medicines used during the test).    SYMPTOMS TO REPORT IMMEDIATELY: A gastroenterologist can be reached at any hour.  During normal business hours, 8:30 AM to 5:00 PM Monday through Friday, call [phone].  After hours and on weekends, please call the GI answering service at [phone] who will take a message and have the physician on call contact you.   Following upper endoscopy (EGD)  Vomiting of blood or coffee ground material  New chest pain or pain under the shoulder blades  Painful or persistently difficult swallowing  New shortness of breath  Fever of 100F or higher  Black, tarry-looking stools  FOLLOW UP: If any biopsies were taken you will be contacted by phone or by letter within the next 1-3 weeks.  Call your gastroenterologist if you have not heard about the biopsies in 3 weeks.  Our staff will call the home number listed on your records the next business day following your procedure to check on you and address any questions or concerns that you may have at that time regarding the information given to you following your procedure. This is a courtesy call and so if there is no answer at the home number and we have not heard from you through the emergency physician on call, we will assume that you have returned to your regular daily activities without incident.  SIGNATURES/CONFIDENTIALITY: You and/or your care partner have signed paperwork which  will be entered into your electronic medical record.  These signatures attest to the fact that that the information above on your After Visit Summary has been reviewed and is understood.  Full responsibility of the confidentiality of this discharge information lies with you and/or your care-partner.

## 2014-04-21 ENCOUNTER — Telehealth: Payer: Self-pay

## 2014-04-21 ENCOUNTER — Telehealth: Payer: Self-pay | Admitting: *Deleted

## 2014-04-21 DIAGNOSIS — R1013 Epigastric pain: Secondary | ICD-10-CM

## 2014-04-21 DIAGNOSIS — R0789 Other chest pain: Secondary | ICD-10-CM

## 2014-04-21 NOTE — Telephone Encounter (Signed)
  Follow up Call-  Call back number 04/20/2014  Post procedure Call Back phone  # [phone] cell  Permission to leave phone message Yes     Patient questions:  Do you have a fever, pain , or abdominal swelling? No. Pain Score  0 *  Have you tolerated food without any problems? Yes.    Have you been able to return to your normal activities? Yes.    Do you have any questions about your discharge instructions: Diet   No. Medications  No. Follow up visit  No.  Do you have questions or concerns about your Care? No.  Actions: * If pain score is 4 or above: No action needed, pain <4.

## 2014-04-21 NOTE — Telephone Encounter (Signed)
Per EGD procedure report she is to have CCK HIDA.  I spoke with the patient and scheduled with her for 05/01/14 7:30 at [facility].  She is notified to be NPO for 6 hours prior hold her Nexium that am.  She is scheduled for REV on 05/27/14 11:00

## 2014-04-24 ENCOUNTER — Ambulatory Visit: Payer: BC Managed Care – PPO | Admitting: Gastroenterology

## 2014-05-01 ENCOUNTER — Ambulatory Visit (HOSPITAL_COMMUNITY)
Admission: RE | Admit: 2014-05-01 | Discharge: 2014-05-01 | Disposition: A | Discharge status: Home / Self Care | Payer: BC Managed Care – PPO | Source: Ambulatory Visit | Attending: Gastroenterology | Admitting: Gastroenterology

## 2014-05-01 DIAGNOSIS — R0789 Other chest pain: Secondary | ICD-10-CM | POA: Diagnosis not present

## 2014-05-01 DIAGNOSIS — R1013 Epigastric pain: Secondary | ICD-10-CM

## 2014-05-01 MED ORDER — SINCALIDE 5 MCG IJ SOLR
0.0200 ug/kg | Freq: Once | INTRAMUSCULAR | Status: AC
  Administered 2014-05-01: 2.1 ug via INTRAVENOUS

## 2014-05-01 MED ORDER — TECHNETIUM TC 99M MEBROFENIN IV KIT
4.6000 | PACK | Freq: Once | INTRAVENOUS | Status: AC | PRN
  Administered 2014-05-01: 5 via INTRAVENOUS

## 2014-05-13 ENCOUNTER — Encounter: Payer: Self-pay | Admitting: Family

## 2014-05-13 ENCOUNTER — Ambulatory Visit (INDEPENDENT_AMBULATORY_CARE_PROVIDER_SITE_OTHER): Payer: BC Managed Care – PPO | Admitting: Family

## 2014-05-13 VITALS — BP 129/59 | HR 67 | Temp 98.3°F | Resp 16 | Ht 67.5 in | Wt 233.2 lb

## 2014-05-13 DIAGNOSIS — M7918 Myalgia, other site: Secondary | ICD-10-CM

## 2014-05-13 DIAGNOSIS — Z23 Encounter for immunization: Secondary | ICD-10-CM

## 2014-05-13 DIAGNOSIS — I10 Essential (primary) hypertension: Secondary | ICD-10-CM

## 2014-05-13 DIAGNOSIS — M791 Myalgia: Secondary | ICD-10-CM

## 2014-05-13 DIAGNOSIS — R739 Hyperglycemia, unspecified: Secondary | ICD-10-CM

## 2014-05-13 LAB — HEMOGLOBIN A1C: Hgb A1c MFr Bld: 6 % (ref 4.6–6.5)

## 2014-05-13 NOTE — Progress Notes (Signed)
Subjective:    Patient ID: Nancy Deleon, female    DOB: 1956/05/31, 58 y.o.   MRN: [id number]  HPI  Nancy Deleon is a 58 yr old female who presents today for follow up.  1) Epigastric/back pain- saw cardiology - Dr. Acie Fredrickson on 8/17 and it was felt that Nancy Deleon symptoms were non-cardiac and most likely of GI etiology.  I saw Nancy patient back on 8/26 and Nancy Deleon reported continued symptoms which were worse after fatty meals.  PPI was switched to nexium and Abdominal US was performed.  Noted fatty liver and ? Small stone of GB polyp but no other GB abnormality. This was followed by a CT of Nancy abdomen and pelvis.  Stool was noted throughout Nancy colon.  No acute findings were noted to explain Nancy patient's pain.  There were some nodules in Nancy bilateral lung bases.  Since Nancy Deleon is low risk (non-smoker) no further follow up was recommended per If Nancy patient is at low risk, no follow-up is needed.  Nancy Deleon was also scheduled for EGD per GI. This was performed on 9/14 and was significant for subtle stricture at Nancy gastroesophageal junction and a small hiatal hernia.  It was recommended that Nancy Deleon increase PPI to BID and complete a hida scan.  Hida scan noted normal gallbladder EF.   HTN-  Nancy Deleon continues amlodipine, denies swelling.  BP Readings from Last 3 Encounters:  05/13/14 129/59  04/20/14 136/76  04/15/14 132/80   Hyperglycemia- has been working on diet Lab Results  Component Value Date   HGBA1C 6.2* 02/09/2014    Review of Systems See HPI  Past Medical History  Diagnosis Date  . Hypertension   . Obesity   . Fatty liver 04/03/2014  . Allergy   . GERD (gastroesophageal reflux disease)     History   Social History  . Marital Status: Widowed    Spouse Name: N/A    Number of Children: 2  . Years of Education: N/A   Occupational History  .     Social History Main Topics  . Smoking status: Former Smoker    Quit date: 04/18/1985  . Smokeless tobacco: Never Used  . Alcohol Use: Yes    Comment: occ  . Drug Use: No  . Sexual Activity: Not on file   Other Topics Concern  . Not on file   Social History Narrative   Regular exercise:  Walks 2-5 x weekly   Caffeine Use: no   Widowed   Nancy Deleon has a son [name]- he is 32 yrs old.   Foster parent.   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   Dog (Lab named Abby)             Past Surgical History  Procedure Laterality Date  . Tubal ligation    . Cesarean section  [id number]  . Breast biopsy  04/24/11    rigth breast  . Colonoscopy    . Upper gastrointestinal endoscopy      Family History  Problem Relation Age of Onset  . Lymphoma Mother   . Cancer Mother     lymphoma  . Dementia Mother   . Cancer Father     lung  . Cancer Maternal Uncle     colon  . Colon cancer Maternal Uncle   . Esophageal cancer Neg Hx   . Pancreatic cancer Neg Hx   . Rectal cancer  Neg Hx   . Stomach cancer Neg Hx     Allergies  Allergen Reactions  . Naproxen     REACTION: vomiting  . Ropinirole Nausea Only  . Vicodin [Hydrocodone-Acetaminophen] Nausea And Vomiting  . Propoxyphene N-Acetaminophen Nausea And Vomiting    Current Outpatient Prescriptions on File Prior to Visit  Medication Sig Dispense Refill  . amLODipine (NORVASC) 10 MG tablet Take 1 tablet (10 mg total) by mouth daily.  90 tablet  1  . Calcium Carbonate-Vitamin D (CALTRATE 600+D) 600-400 MG-UNIT per tablet Take 1 tablet by mouth 2 (two) times daily.      . sucralfate (CARAFATE) 1 GM/10ML suspension Take 10 mLs (1 g total) by mouth 4 (four) times daily -  with meals and at bedtime.  420 mL  0  . esomeprazole (NEXIUM) 40 MG capsule Take 40 mg by mouth bid  60 capsule  11   No current facility-administered medications on file prior to visit.    BP 129/59  Pulse 67  Temp(Src) 98.3 F (36.8 C) (Oral)  Resp 16  Ht 5' 7.5" (1.715 m)  Wt 233 lb 3.2 oz (105.779 kg)  BMI 35.96 kg/m2  SpO2 100%  LMP  02/28/2007       Objective:   Physical Exam  Constitutional: Nancy Deleon is oriented to person, place, and time. Nancy Deleon appears well-developed and well-nourished. No distress.  Cardiovascular: Normal rate and regular rhythm.   No murmur heard. Pulmonary/Chest: Effort normal and breath sounds normal. No respiratory distress. Nancy Deleon has no wheezes. Nancy Deleon has no rales. Nancy Deleon exhibits no tenderness.  Musculoskeletal: Nancy Deleon exhibits no edema.       Thoracic back: Nancy Deleon exhibits tenderness.  Neurological: Nancy Deleon is alert and oriented to person, place, and time.  Psychiatric: Nancy Deleon has a normal mood and affect. Nancy Deleon behavior is normal. Judgment and thought content normal.          Assessment & Plan:

## 2014-05-13 NOTE — Patient Instructions (Signed)
Please complete lab work prior to leaving. You may use tylenol as needed for pain. Follow up in  3 months.  

## 2014-05-13 NOTE — Progress Notes (Signed)
Pre visit review using our clinic review tool, if applicable. No additional management support is needed unless otherwise documented below in the visit note. 

## 2014-05-14 DIAGNOSIS — M7918 Myalgia, other site: Secondary | ICD-10-CM | POA: Insufficient documentation

## 2014-05-14 NOTE — Assessment & Plan Note (Signed)
Lab Results  Component Value Date   HGBA1C 6.0 05/13/2014   Follow up A1C is improved, continue diet/exericse.

## 2014-05-14 NOTE — Assessment & Plan Note (Signed)
+   musculoskeletal pain in thoracic area.  I think that her pain that "radiates to her back" is musculoskeletal.  We discussed various pain options, but she prefers trial of tylenol.

## 2014-05-14 NOTE — Assessment & Plan Note (Signed)
BP is stable on current meds.  Continue same.  

## 2014-05-27 ENCOUNTER — Encounter: Payer: Self-pay | Admitting: Gastroenterology

## 2014-05-27 ENCOUNTER — Ambulatory Visit (INDEPENDENT_AMBULATORY_CARE_PROVIDER_SITE_OTHER): Payer: BC Managed Care – PPO | Admitting: Gastroenterology

## 2014-05-27 VITALS — BP 136/70 | HR 68 | Ht 67.0 in | Wt 235.5 lb

## 2014-05-27 DIAGNOSIS — K7689 Other specified diseases of liver: Secondary | ICD-10-CM

## 2014-05-27 DIAGNOSIS — R072 Precordial pain: Secondary | ICD-10-CM

## 2014-05-27 NOTE — Progress Notes (Signed)
    History of Present Illness: This is a 58 year old female returning for follow up. EGD shows a subtle EGJ stricture and a small HH. Gastrointestinal evaluation has showed several findings but nothing to explain chest pain. She relates persistent problems with episodic midsternal chest pain that radiates through to the back. Her symptoms tend to last about a minute at a time. They're not generally associated with any digestive function.   Abd/plevic CT 04/2014 IMPRESSION:  1. No acute findings to explain the patient's pain.  2. Stool throughout the colon is indicative of constipation.  3. Pulmonary nodular densities in the lung bases. If the patient is at high risk for bronchogenic carcinoma, follow-up chest CT at 1 year is recommended.  4. Calcified lesions in the uterus are likely fibroids. 5. Hepatic cysts, largest is 4 cm in dome of liver 6. Left renal cyst, 2.1 cm  CCK-HIDA 04/2014 IMPRESSION:  1. Patent cystic duct without evidence for acute cholecystitis.  2. Normal gallbladder ejection fraction at 86.5%   Current Medications, Allergies, Past Medical History, Past Surgical History, Family History and Social History were reviewed in [employer] record.  Physical Exam: General: Well developed , well nourished, no acute distress Head: Normocephalic and atraumatic Eyes:  sclerae anicteric, EOMI Ears: Normal auditory acuity Mouth: No deformity or lesions Lungs: Clear throughout to auscultation Heart: Regular rate and rhythm; no murmurs, rubs or bruits Abdomen: Soft, non tender and non distended. No masses, hepatosplenomegaly or hernias noted. Normal Bowel sounds Musculoskeletal: Symmetrical with no gross deformities  Pulses:  Normal pulses noted Extremities: No clubbing, cyanosis, edema or deformities noted Neurological: Alert oriented x 4, grossly nonfocal Psychological:  Alert and cooperative. Normal mood and affect  Assessment and Recommendations:  1.  Chest pain. No gastrointestinal cause uncovered. Followup with PCP.  2. Hepatic cysts. Followup abd [place] in one year to assess stability.  3. GERD. This is not the cause of her chest pain. Continue Nexium 40 mg daily and standard antireflux measures.  4. Cholelithiasis, asymptomatic.  5. Left renal cyst. Followup with PCP.  6. Fatty liver. Long-term low fat and weight loss diet supervised by her PCP.

## 2014-05-27 NOTE — Patient Instructions (Signed)
Please follow up with Dr Fuller Plan as needed.  You will be due for a recall colonoscopy in 11/2019. We will send you a reminder in the mail when it gets closer to that time.  CC: [name], MD

## 2014-06-08 ENCOUNTER — Encounter: Payer: Self-pay | Admitting: Gastroenterology

## 2014-06-26 ENCOUNTER — Other Ambulatory Visit: Payer: Self-pay | Admitting: *Deleted

## 2014-06-26 DIAGNOSIS — R1013 Epigastric pain: Secondary | ICD-10-CM

## 2014-06-26 MED ORDER — AMLODIPINE BESYLATE 10 MG PO TABS: 10.0000 mg | ORAL_TABLET | Freq: Every day | ORAL | Status: DC

## 2014-06-26 NOTE — Telephone Encounter (Signed)
Received fax from [facility] for Rxs of amlodipine 10mg  and esomeprazole 40mg  BRAND NAME ONLY as plan will only cover the brand name. Refill sent for amlodipine. Nexium request sent to GI.

## 2014-07-09 MED ORDER — ESOMEPRAZOLE MAGNESIUM 40 MG PO CPDR
DELAYED_RELEASE_CAPSULE | ORAL | Status: DC
Start: 2014-07-09 — End: 2014-09-11

## 2014-07-20 ENCOUNTER — Telehealth: Payer: Self-pay

## 2014-07-20 ENCOUNTER — Other Ambulatory Visit: Payer: Self-pay

## 2014-07-20 DIAGNOSIS — K7689 Other specified diseases of liver: Secondary | ICD-10-CM

## 2014-07-20 NOTE — Telephone Encounter (Signed)
Left message for the patient to call back. It is time for the repeat liver ultrasound to assess the cysts of the liver.

## 2014-07-20 NOTE — Telephone Encounter (Signed)
Patient advised.

## 2014-07-20 NOTE — Telephone Encounter (Signed)
-----   Message from Hulan Saas, RN sent at 07/16/2014  9:35 AM EST -----   ----- Message -----    From: Hulan Saas, RN    Sent: 07/16/2014      To: Hulan Saas, RN  Patient needs abd Ultrasound to assess right hepatic cyst per AE.

## 2014-07-20 NOTE — Telephone Encounter (Signed)
U/s scheduled for 08/05/14 arrive at 8:15 am and fast for 6 hrs prior to the test. It is scheduled at the [facility]. Letter with this information mailed to the patient.

## 2014-08-05 ENCOUNTER — Ambulatory Visit (HOSPITAL_COMMUNITY)
Admission: RE | Admit: 2014-08-05 | Discharge: 2014-08-05 | Disposition: A | Discharge status: Home / Self Care | Payer: BC Managed Care – PPO | Source: Ambulatory Visit | Attending: Physician Assistant | Admitting: Physician Assistant

## 2014-08-05 DIAGNOSIS — K824 Cholesterolosis of gallbladder: Secondary | ICD-10-CM | POA: Insufficient documentation

## 2014-08-05 DIAGNOSIS — K7689 Other specified diseases of liver: Secondary | ICD-10-CM

## 2014-08-05 DIAGNOSIS — K760 Fatty (change of) liver, not elsewhere classified: Secondary | ICD-10-CM | POA: Diagnosis not present

## 2014-08-10 ENCOUNTER — Encounter: Payer: Self-pay | Admitting: Family

## 2014-08-10 ENCOUNTER — Ambulatory Visit (HOSPITAL_BASED_OUTPATIENT_CLINIC_OR_DEPARTMENT_OTHER)
Admission: RE | Admit: 2014-08-10 | Discharge: 2014-08-10 | Disposition: A | Discharge status: Home / Self Care | Payer: 59 | Source: Ambulatory Visit | Attending: Family | Admitting: Family

## 2014-08-10 ENCOUNTER — Ambulatory Visit (INDEPENDENT_AMBULATORY_CARE_PROVIDER_SITE_OTHER): Admitting: Family

## 2014-08-10 VITALS — BP 100/70 | HR 67 | Temp 98.6°F | Resp 16 | Ht 67.5 in | Wt 235.4 lb

## 2014-08-10 DIAGNOSIS — M5134 Other intervertebral disc degeneration, thoracic region: Secondary | ICD-10-CM | POA: Diagnosis not present

## 2014-08-10 DIAGNOSIS — R739 Hyperglycemia, unspecified: Secondary | ICD-10-CM

## 2014-08-10 DIAGNOSIS — M545 Low back pain, unspecified: Secondary | ICD-10-CM | POA: Insufficient documentation

## 2014-08-10 DIAGNOSIS — M549 Dorsalgia, unspecified: Secondary | ICD-10-CM | POA: Diagnosis not present

## 2014-08-10 DIAGNOSIS — I10 Essential (primary) hypertension: Secondary | ICD-10-CM | POA: Diagnosis not present

## 2014-08-10 DIAGNOSIS — N281 Cyst of kidney, acquired: Secondary | ICD-10-CM | POA: Insufficient documentation

## 2014-08-10 DIAGNOSIS — M546 Pain in thoracic spine: Secondary | ICD-10-CM

## 2014-08-10 DIAGNOSIS — K760 Fatty (change of) liver, not elsewhere classified: Secondary | ICD-10-CM | POA: Diagnosis not present

## 2014-08-10 DIAGNOSIS — G2581 Restless legs syndrome: Secondary | ICD-10-CM

## 2014-08-10 DIAGNOSIS — Q6100 Congenital renal cyst, unspecified: Secondary | ICD-10-CM

## 2014-08-10 MED ORDER — CARBIDOPA-LEVODOPA 25-100 MG PO TABS: ORAL_TABLET | ORAL | Status: DC

## 2014-08-10 NOTE — Assessment & Plan Note (Signed)
BP stable.  Continue amlodipine. 

## 2014-08-10 NOTE — Assessment & Plan Note (Signed)
Continue exercise and reduce sugar and calories for weight reduction.

## 2014-08-10 NOTE — Patient Instructions (Signed)
Please complete x ray on the first floor.  You may use tylenol as needed for back pain.  Start sinemet.  Follow up in 3 months, sooner if problems or concerns.

## 2014-08-10 NOTE — Assessment & Plan Note (Signed)
Plan to monitor with ultrasound in 6 months.

## 2014-08-10 NOTE — Progress Notes (Addendum)
   Subjective:    Patient ID: UNDRA TREMBATH, female    DOB: 1956/03/02, 59 y.o.   MRN: [id number]  HPI  Ms. Mcphie is here today for FU.   1) Epigastric/Back Pain:  She saw Dr. Fuller Plan (gastro) 05/27/2014 and did not find a GI reason for her epigastric back pain. Pt still having pain in mid back sometimes as high as 8/10 when at work. Denies radiation of pain to arms or legs. 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 Tylenol helps some. She does take breaks and stretch when possible.  2) HYPERTENSION: Has ordered BP cuff to monitor BP at home. Compliant with amlodipine. Denies chest pain, palpitations, dyspnea, edema.       BP Readings from Last 3 Encounters:  08/10/14 100/70  05/27/14 136/70  05/13/14 129/59   3) Hyperglycemia and Fatty liver: Diet: patient has been watching sugar intake and plans to be more vigilant with diet now. Exercise: walks 3 days per week for 1 hour to 1.5 hours. Wt Readings from Last 3 Encounters:  08/10/14 235 lb 6.4 oz (106.777 kg)  05/27/14 235 lb 8 oz (106.822 kg)  05/13/14 233 lb 3.2 oz (105.779 kg)   4) Restless/aching legs: legs have ached and felt "odd" most nights for the past week, waking her from sleep. Elevation on a pillow helps. Lab Results  Component Value Date   HGBA1C 6.0 05/13/2014     Review of Systems  Neurological:       Denies numbness/tingling in legs and arms. No bowel or urine incontinence.       Objective:   Physical Exam  Constitutional: She is oriented to person, place, and time. She appears well-developed and well-nourished. No distress.  HENT:  Head: Normocephalic and atraumatic.  Neck: Normal range of motion.  Cardiovascular: Normal rate, regular rhythm and normal heart sounds.  Exam reveals no gallop and no friction rub.   No murmur heard. Pulmonary/Chest: Effort normal and breath sounds normal. No respiratory distress. She has no wheezes. She has no rales.    Musculoskeletal:  Strength of extremities 5/5. Tenderness on palpation between shoulder blades midline.  Neurological: She is alert and oriented to person, place, and time. She has normal strength.  Reflex Scores:      Patellar reflexes are 2+ on the right side and 2+ on the left side. Skin: Skin is warm and dry. She is not diaphoretic.  Psychiatric: She has a normal mood and affect. Her behavior is normal. Thought content normal.          Assessment & Plan:  Patient along with Dawn Whitmire NP-student.  I have personally seen and examined patient and agree with Ms. Whitmire's assessment and plan.

## 2014-08-10 NOTE — Assessment & Plan Note (Signed)
Obtain xray. Tylenol for pain.

## 2014-08-10 NOTE — Progress Notes (Signed)
Pre visit review using our clinic review tool, if applicable. No additional management support is needed unless otherwise documented below in the visit note. 

## 2014-08-10 NOTE — Assessment & Plan Note (Signed)
Begin Sinemet 1/2 tablet at bedtime. May increase to one tablet if needed.

## 2014-08-11 NOTE — Addendum Note (Signed)
Addended by: [name] on: 08/11/2014 07:31 AM   Modules accepted: Miquel Dunn

## 2014-08-18 ENCOUNTER — Ambulatory Visit (INDEPENDENT_AMBULATORY_CARE_PROVIDER_SITE_OTHER): Payer: 59 | Admitting: Medical

## 2014-08-18 ENCOUNTER — Encounter: Payer: Self-pay | Admitting: Medical

## 2014-08-18 VITALS — BP 135/79 | HR 66 | Temp 98.4°F | Ht 67.5 in | Wt 237.0 lb

## 2014-08-18 DIAGNOSIS — J0100 Acute maxillary sinusitis, unspecified: Secondary | ICD-10-CM | POA: Diagnosis not present

## 2014-08-18 DIAGNOSIS — J029 Acute pharyngitis, unspecified: Secondary | ICD-10-CM

## 2014-08-18 DIAGNOSIS — J329 Chronic sinusitis, unspecified: Secondary | ICD-10-CM | POA: Insufficient documentation

## 2014-08-18 LAB — POCT RAPID STREP A (OFFICE): Rapid Strep A Screen: NEGATIVE

## 2014-08-18 MED ORDER — BENZONATATE 100 MG PO CAPS: 100.0000 mg | ORAL_CAPSULE | Freq: Three times a day (TID) | ORAL | Status: DC | PRN

## 2014-08-18 MED ORDER — CEFDINIR 300 MG PO CAPS: 300.0000 mg | ORAL_CAPSULE | Freq: Two times a day (BID) | ORAL | Status: DC

## 2014-08-18 MED ORDER — FLUTICASONE PROPIONATE 50 MCG/ACT NA SUSP: 2.0000 | Freq: Every day | NASAL | Status: DC

## 2014-08-18 NOTE — Progress Notes (Signed)
Subjective:    Patient ID: Nancy Deleon, female    DOB: May 05, 1956, 59 y.o.   MRN: [id number]  HPI   Pt in today reporting  cough, nasal congestion and runny nose for  2  Days. Sorethroat for 2 days.  Productive cough  Associated symptoms( below yes or no)  Fever-no Chills-yes Chest congestion-No Sneezing- yes Itching eyes-yes Sore throat- yes Post-nasal drainage-Yes Wheezing-NO Purulent nasal drainage-yes Fatigue- Yes, mild.  Past Medical History  Diagnosis Date  . Hypertension   . Obesity   . Fatty liver 04/03/2014  . Allergy   . GERD (gastroesophageal reflux disease)     History   Social History  . Marital Status: Widowed    Spouse Name: N/A    Number of Children: 2  . Years of Education: N/A   Occupational History  .     Social History Main Topics  . Smoking status: Former Smoker    Quit date: 04/18/1985  . Smokeless tobacco: Never Used  . Alcohol Use: Yes     Comment: occ  . Drug Use: No  . Sexual Activity: Not on file   Other Topics Concern  . Not on file   Social History Narrative   Regular exercise:  Walks 2-5 x weekly   Caffeine Use: no   Widowed   She has a son [name]- he is 42 yrs old.   Foster parent.   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   Dog (Lab named Abby)             Past Surgical History  Procedure Laterality Date  . Tubal ligation    . Cesarean section  [id number]  . Breast biopsy  04/24/11    rigth breast  . Colonoscopy    . Upper gastrointestinal endoscopy      Family History  Problem Relation Age of Onset  . Lymphoma Mother   . Cancer Mother     lymphoma  . Dementia Mother   . Cancer Father     lung  . Cancer Maternal Uncle     colon  . Colon cancer Maternal Uncle   . Esophageal cancer Neg Hx   . Pancreatic cancer Neg Hx   . Rectal cancer Neg Hx   . Stomach cancer Neg Hx     Allergies  Allergen Reactions  . Naproxen     REACTION:  vomiting  . Ropinirole Nausea Only  . Vicodin [Hydrocodone-Acetaminophen] Nausea And Vomiting  . Propoxyphene N-Acetaminophen Nausea And Vomiting    Current Outpatient Prescriptions on File Prior to Visit  Medication Sig Dispense Refill  . amLODipine (NORVASC) 10 MG tablet Take 1 tablet (10 mg total) by mouth daily. 90 tablet 1  . Calcium Carbonate-Vitamin D (CALTRATE 600+D) 600-400 MG-UNIT per tablet Take 1 tablet by mouth 2 (two) times daily.    . carbidopa-levodopa (SINEMET) 25-100 MG per tablet 1/2 to 1 tab by mouth at bedtime for restless legs 30 tablet 2  . esomeprazole (NEXIUM) 40 MG capsule Take 40 mg by mouth bid 60 capsule 11  . PATADAY 0.2 % SOLN Place 1 drop into both eyes daily.  12   No current facility-administered medications on file prior to visit.    BP 135/79 mmHg  Pulse 66  Temp(Src) 98.4 F (36.9 C) (Oral)  Ht 5' 7.5" (1.715 m)  Wt 237 lb (107.502 kg)  BMI 36.55 kg/m2  SpO2  98%  LMP 02/28/2007         Review of Systems  Constitutional: Positive for chills and fatigue. Negative for fever.  HENT: Positive for congestion, rhinorrhea, sneezing and sore throat. Negative for ear pain.   Eyes: Positive for itching.  Respiratory: Positive for cough.   Cardiovascular: Negative for chest pain and palpitations.  Musculoskeletal: Negative for back pain.  Neurological: Negative for dizziness.  Hematological: Negative for adenopathy. Does not bruise/bleed easily.  Psychiatric/Behavioral: Negative for behavioral problems and agitation.       Objective:   Physical Exam   General  Mental Status - Alert. General Appearance - Well groomed. Not in acute distress.  Skin Rashes- No Rashes.  HEENT Head- Normal. Ear Auditory Canal - Left- Normal. Right - Normal.Tympanic Membrane- Left- Normal. Right- Normal. Eye Sclera/Conjunctiva- Left- Normal. Right- Normal. Nose & Sinuses Nasal Mucosa- Left-  Boggy and Congested. Right-  Boggy and  Congested.Bilaterally  no maxillary and no frontal sinus pressure.(But some reported recently) Mouth & Throat Lips: Upper Lip- Normal: no dryness, cracking, pallor, cyanosis, or vesicular eruption. Lower Lip-Normal: no dryness, cracking, pallor, cyanosis or vesicular eruption. Buccal Mucosa- Bilateral- No Aphthous ulcers. Oropharynx- No Discharge mild  Erythema. +pnd. Tonsils: Characteristics- Bilateral- Erythema + Congestion. Size/Enlargement- Bilateral- 1+ enlargement. Discharge- bilateral-None.  Neck Neck- Supple. No Masses.   Chest and Lung Exam Auscultation: Breath Sounds:-Clear even and unlabored.  Cardiovascular Auscultation:Rythm- Regular, rate and rhythm. Murmurs & Other Heart Sounds:Ausculatation of the heart reveal- No Murmurs.  Lymphatic Head & Neck General Head & Neck Lymphatics: Bilateral: Description- No Localized lymphadenopathy.         Assessment & Plan:

## 2014-08-18 NOTE — Progress Notes (Signed)
Pre visit review using our clinic review tool, if applicable. No additional management support is needed unless otherwise documented below in the visit note. 

## 2014-08-18 NOTE — Assessment & Plan Note (Signed)
Your appear to have a sinus infection.(may have strep throat as well. Test negative but appearance of throat somewh at suspicious) I am prescribing cefdnir  antibiotic for the infection. To help with the nasal congestion I prescribed flonase  nasal steroid. For your associated cough, I prescribed benzonatate  cough medicine.  Rest, hydrate, tylenol for fever.  Follow up in 7 days or as needed.

## 2014-08-18 NOTE — Patient Instructions (Signed)
Your appear to have a sinus infection.(may have strep throat as well. Test negative but appearance of throat somewh at suspicious) I am prescribing cefdnir  antibiotic for the infection. To help with the nasal congestion I prescribed flonase  nasal steroid. For your associated cough, I prescribed benzonatate  cough medicine.  Rest, hydrate, tylenol for fever.  Follow up in 7 days or as needed.

## 2014-08-21 ENCOUNTER — Telehealth: Payer: Self-pay | Admitting: Family

## 2014-08-21 NOTE — Telephone Encounter (Signed)
Caller name: Jenie, Parish Relation to pt: self  Call back number: [phone]    Reason for call:  Pt is still experiencing sore throat and coughing in need of clinical advice

## 2014-08-24 NOTE — Telephone Encounter (Signed)
  Would advise pt to come in if pt thinks necessary. I only saw her on Friday. About 3 days on tx. Would not recommend stronger antibiotic without exam. Also due to allergy hx medications options for cough are limited.

## 2014-08-26 NOTE — Telephone Encounter (Signed)
Left message for patient to call back for OV so that her condition can be evaluated.

## 2014-08-28 ENCOUNTER — Ambulatory Visit: Payer: 59 | Admitting: Family

## 2014-09-11 ENCOUNTER — Telehealth: Payer: Self-pay | Admitting: Family

## 2014-09-11 ENCOUNTER — Ambulatory Visit (INDEPENDENT_AMBULATORY_CARE_PROVIDER_SITE_OTHER): Payer: 59 | Admitting: Family

## 2014-09-11 ENCOUNTER — Encounter: Payer: Self-pay | Admitting: Family

## 2014-09-11 VITALS — BP 100/70 | HR 69 | Temp 98.7°F | Resp 16 | Ht 67.5 in | Wt 234.0 lb

## 2014-09-11 DIAGNOSIS — R1013 Epigastric pain: Secondary | ICD-10-CM

## 2014-09-11 DIAGNOSIS — J0100 Acute maxillary sinusitis, unspecified: Secondary | ICD-10-CM

## 2014-09-11 MED ORDER — PREDNISONE 10 MG PO TABS: ORAL_TABLET | ORAL | Status: DC

## 2014-09-11 MED ORDER — PANTOPRAZOLE SODIUM 40 MG PO TBEC: 40.0000 mg | DELAYED_RELEASE_TABLET | Freq: Every day | ORAL | Status: DC

## 2014-09-11 MED ORDER — LEVOFLOXACIN 500 MG PO TABS: 500.0000 mg | ORAL_TABLET | Freq: Every day | ORAL | Status: DC

## 2014-09-11 MED ORDER — ESOMEPRAZOLE MAGNESIUM 40 MG PO CPDR: DELAYED_RELEASE_CAPSULE | ORAL | Status: DC

## 2014-09-11 MED ORDER — GUAIFENESIN-CODEINE 100-10 MG/5ML PO SYRP: 5.0000 mL | ORAL_SOLUTION | Freq: Three times a day (TID) | ORAL | Status: DC | PRN

## 2014-09-11 NOTE — Progress Notes (Signed)
Pre visit review using our clinic review tool, if applicable. No additional management support is needed unless otherwise documented below in the visit note. 

## 2014-09-11 NOTE — Telephone Encounter (Signed)
Caller name:Jennifer Relation to OB:SJGG Call back [phone] Pharmacy:[facility]   Reason for call: pharmacy states insurance will not pay for rx esomeprazole (Gregory) 40 MG capsule, pt needs an alternative, pharmacy needs to know which alternative to use.

## 2014-09-11 NOTE — Patient Instructions (Signed)
Start levaquin (antibiotic) and prednisone taper. You may use cheratussin cough syrup as needed. Continue zyrtec 10mg  once daily. Call if symptoms worsen, or if not improved in 1 week.

## 2014-09-11 NOTE — Progress Notes (Signed)
Subjective:    Patient ID: Nancy Deleon, female    DOB: [birth year], 59 y.o.   MRN: [id number]  HPI    Nancy Deleon is here today for cough productive of thick, brown sputum, nasal congestion, sneezing, watery eyes and sinus drainage for 3 weeks. Reports headache when she coughs, sinus pressure, sore throat. She was seen for sinusitis on 08/18/2014 and prescribed cefdinir. She took the entire prescription. Reports starting to feel better last week and then she worsened. She is not using Flonase because she it makes her nose bleed. Tessalon did not help cough. Cough is keeping her up at night.   Review of Systems  Constitutional: Positive for chills, activity change and fatigue. Negative for fever.  HENT: Positive for sinus pressure and sore throat. Negative for ear pain and postnasal drip.    Past Medical History  Diagnosis Date  . Hypertension   . Obesity   . Fatty liver 04/03/2014  . Allergy   . GERD (gastroesophageal reflux disease)     History   Social History  . Marital Status: Widowed    Spouse Name: N/A    Number of Children: 2  . Years of Education: N/A   Occupational History  .     Social History Main Topics  . Smoking status: Former Smoker    Quit date: 04/18/1985  . Smokeless tobacco: Never Used  . Alcohol Use: Yes     Comment: occ  . Drug Use: No  . Sexual Activity: Not on file   Other Topics Concern  . Not on file   Social History Narrative   Regular exercise:  Walks 2-5 x weekly   Caffeine Use: no   Widowed   She has a son [name]- he is 42 yrs old.   Foster parent.   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   Dog (Lab named Abby)             Past Surgical History  Procedure Laterality Date  . Tubal ligation    . Cesarean section  [id number]  . Breast biopsy  04/24/11    rigth breast  . Colonoscopy    . Upper gastrointestinal endoscopy      Family History  Problem Relation Age of  Onset  . Lymphoma Mother   . Cancer Mother     lymphoma  . Dementia Mother   . Cancer Father     lung  . Cancer Maternal Uncle     colon  . Colon cancer Maternal Uncle   . Esophageal cancer Neg Hx   . Pancreatic cancer Neg Hx   . Rectal cancer Neg Hx   . Stomach cancer Neg Hx     Allergies  Allergen Reactions  . Naproxen     REACTION: vomiting  . Ropinirole Nausea Only  . Vicodin [Hydrocodone-Acetaminophen] Nausea And Vomiting  . Propoxyphene N-Acetaminophen Nausea And Vomiting    Current Outpatient Prescriptions on File Prior to Visit  Medication Sig Dispense Refill  . amLODipine (NORVASC) 10 MG tablet Take 1 tablet (10 mg total) by mouth daily. 90 tablet 1  . Calcium Carbonate-Vitamin D (CALTRATE 600+D) 600-400 MG-UNIT per tablet Take 1 tablet by mouth 2 (two) times daily.    . carbidopa-levodopa (SINEMET) 25-100 MG per tablet 1/2 to 1 tab by mouth at bedtime for restless legs 30 tablet 2  . PATADAY 0.2 % SOLN Place 1 drop  into both eyes daily.  12   No current facility-administered medications on file prior to visit.    BP 100/70 mmHg  Pulse 69  Temp(Src) 98.7 F (37.1 C) (Oral)  Resp 16  Ht 5' 7.5" (1.715 m)  Wt 234 lb (106.142 kg)  BMI 36.09 kg/m2  SpO2 98%  LMP 02/28/2007       Objective:   Physical Exam  Constitutional: She appears well-developed and well-nourished. No distress.  HENT:  Right Ear: Tympanic membrane normal.  Left Ear: Tympanic membrane normal.  Nose: Right sinus exhibits frontal sinus tenderness. Right sinus exhibits no maxillary sinus tenderness. Left sinus exhibits frontal sinus tenderness. Left sinus exhibits no maxillary sinus tenderness.  Mouth/Throat: No oropharyngeal exudate, posterior oropharyngeal edema or posterior oropharyngeal erythema.  Eyes: Right eye exhibits no discharge. Left eye exhibits no discharge.  Cardiovascular: Normal rate, regular rhythm and normal heart sounds.  Exam reveals no gallop and no friction rub.     No murmur heard. Pulmonary/Chest: Effort normal and breath sounds normal. No respiratory distress. She has no wheezes. She has no rales.  Lymphadenopathy:    She has no cervical adenopathy.  Skin: She is not diaphoretic.          Assessment & Plan:  Patient seen along with Dawn Whitmire NP-student.  I have personally seen and examined patient and agree with Ms. Whitmire's assessment and plan- [name] NP

## 2014-09-11 NOTE — Telephone Encounter (Signed)
Left detailed message on pt's cell# and to call if any questions. 

## 2014-09-11 NOTE — Telephone Encounter (Signed)
Will rx protonix.  pls notify pt.

## 2014-09-11 NOTE — Assessment & Plan Note (Addendum)
Completed cefdinir and felt better and then worsened. Rx for levaquin, cheratussin, and prednisone taper. Reports she has taken codeine before without adverse effects.

## 2014-09-18 ENCOUNTER — Ambulatory Visit: Payer: 59 | Admitting: Gastroenterology

## 2014-10-12 ENCOUNTER — Telehealth: Payer: Self-pay | Admitting: Family

## 2014-10-12 NOTE — Telephone Encounter (Signed)
I would recommend OV for further evaluation please.

## 2014-10-12 NOTE — Telephone Encounter (Signed)
She got an rx for some pills for the acid reflux.  These pills are not working.  She needs something different.  [facility] [address]

## 2014-10-13 MED ORDER — OMEPRAZOLE 40 MG PO CPDR: 40.0000 mg | DELAYED_RELEASE_CAPSULE | Freq: Every day | ORAL | Status: DC

## 2014-10-13 NOTE — Telephone Encounter (Signed)
D/c protonix, trial of omeprazole. If not better on omeprazole, she should follow back up with GI.

## 2014-10-13 NOTE — Telephone Encounter (Signed)
Left detailed message on voicemail and to call if any questions. 

## 2014-10-13 NOTE — Telephone Encounter (Signed)
Pt was previously placed on Nexium by GI for her reflux symptoms and she had reported that it was helping. Nexium is no longer covered by her insurance so we changed her to Protonix. Does she still need office visit since she doesn't feel protonix is working like nexium did?

## 2014-11-09 ENCOUNTER — Ambulatory Visit (HOSPITAL_BASED_OUTPATIENT_CLINIC_OR_DEPARTMENT_OTHER)
Admission: RE | Admit: 2014-11-09 | Discharge: 2014-11-09 | Disposition: A | Discharge status: Home / Self Care | Payer: 59 | Source: Ambulatory Visit | Attending: Family | Admitting: Family

## 2014-11-09 ENCOUNTER — Ambulatory Visit (INDEPENDENT_AMBULATORY_CARE_PROVIDER_SITE_OTHER): Payer: 59 | Admitting: Family

## 2014-11-09 ENCOUNTER — Encounter: Payer: Self-pay | Admitting: Family

## 2014-11-09 VITALS — BP 122/74 | HR 63 | Temp 97.4°F | Resp 16 | Ht 67.5 in | Wt 229.0 lb

## 2014-11-09 DIAGNOSIS — G2581 Restless legs syndrome: Secondary | ICD-10-CM | POA: Diagnosis not present

## 2014-11-09 DIAGNOSIS — R609 Edema, unspecified: Secondary | ICD-10-CM | POA: Diagnosis not present

## 2014-11-09 DIAGNOSIS — M79662 Pain in left lower leg: Secondary | ICD-10-CM | POA: Diagnosis not present

## 2014-11-09 DIAGNOSIS — R739 Hyperglycemia, unspecified: Secondary | ICD-10-CM

## 2014-11-09 DIAGNOSIS — M79669 Pain in unspecified lower leg: Secondary | ICD-10-CM

## 2014-11-09 DIAGNOSIS — M79661 Pain in right lower leg: Secondary | ICD-10-CM | POA: Insufficient documentation

## 2014-11-09 DIAGNOSIS — I10 Essential (primary) hypertension: Secondary | ICD-10-CM | POA: Diagnosis not present

## 2014-11-09 DIAGNOSIS — M545 Low back pain: Secondary | ICD-10-CM

## 2014-11-09 DIAGNOSIS — M546 Pain in thoracic spine: Secondary | ICD-10-CM

## 2014-11-09 LAB — BASIC METABOLIC PANEL
BUN: 12 mg/dL (ref 6–23)
CO2: 30 mEq/L (ref 19–32)
Calcium: 10.2 mg/dL (ref 8.4–10.5)
Chloride: 104 mEq/L (ref 96–112)
Creatinine, Ser: 0.84 mg/dL (ref 0.40–1.20)
GFR: 89.55 mL/min (ref >60.00)
Glucose, Bld: 108 mg/dL — ABNORMAL HIGH (ref 70–99)
Potassium: 3.6 mEq/L (ref 3.5–5.1)
SODIUM: 139 meq/L (ref 135–145)

## 2014-11-09 LAB — HEMOGLOBIN A1C: HEMOGLOBIN A1C: 6.2 % (ref 4.6–6.5)

## 2014-11-09 MED ORDER — AMLODIPINE BESYLATE 10 MG PO TABS: 10.0000 mg | ORAL_TABLET | Freq: Every day | ORAL | Status: DC

## 2014-11-09 NOTE — Assessment & Plan Note (Signed)
Denies restlessness, just has aching pain in bilateral calf.  Advised Bilateral LE doppler to rule out DVT.

## 2014-11-09 NOTE — Progress Notes (Signed)
Pre visit review using our clinic review tool, if applicable. No additional management support is needed unless otherwise documented below in the visit note. 

## 2014-11-09 NOTE — Assessment & Plan Note (Signed)
Improved, monitor

## 2014-11-09 NOTE — Progress Notes (Signed)
Subjective:    Patient ID: Nancy Deleon, female    DOB: 1956-07-26, 59 y.o.   MRN: [id number]  HPI  Nancy Deleon is a 59 yr old female who presents today for follow up.   HTN-  Patient is currently maintained on the following medications for blood pressure: amlodipine Patient reports good compliance with blood pressure medications. Patient denies chest pain, shortness of breath or swelling. Last 3 blood pressure readings in our office are as follows: BP Readings from Last 3 Encounters:  11/09/14 122/74  09/11/14 100/70  08/18/14 135/79   Hyperglycemia-   Lab Results  Component Value Date   HGBA1C 6.0 05/13/2014   RLS- maintained on sinemet- reports bilateral leg pain,  L>R. Walking helps the pain.   Has been painful for several months.  Not taking sinemet.    Sore throat- reports scratchy throat x 2 days, slight nasal drainage. Using OTC zyrtec, has used claritin in the past which worked better.  Using pataday.      Review of Systems    see HPI- reports improvement in her back pain with walking  Past Medical History  Diagnosis Date  . Hypertension   . Obesity   . Fatty liver 04/03/2014  . Allergy   . GERD (gastroesophageal reflux disease)     History   Social History  . Marital Status: Widowed    Spouse Name: N/A  . Number of Children: 2  . Years of Education: N/A   Occupational History  .     Social History Main Topics  . Smoking status: Former Smoker    Quit date: 04/18/1985  . Smokeless tobacco: Never Used  . Alcohol Use: Yes     Comment: occ  . Drug Use: No  . Sexual Activity: Not on file   Other Topics Concern  . Not on file   Social History Narrative   Regular exercise:  Walks 2-5 x weekly   Caffeine Use: no   Widowed   She has a son [name]- he is 75 yrs old.   Foster parent.   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   Dog (Lab named Abby)             Past Surgical History    Procedure Laterality Date  . Tubal ligation    . Cesarean section  [id number]  . Breast biopsy  04/24/11    rigth breast  . Colonoscopy    . Upper gastrointestinal endoscopy      Family History  Problem Relation Age of Onset  . Lymphoma Mother   . Cancer Mother     lymphoma  . Dementia Mother   . Cancer Father     lung  . Cancer Maternal Uncle     colon  . Colon cancer Maternal Uncle   . Esophageal cancer Neg Hx   . Pancreatic cancer Neg Hx   . Rectal cancer Neg Hx   . Stomach cancer Neg Hx     Allergies  Allergen Reactions  . Naproxen     REACTION: vomiting  . Ropinirole Nausea Only  . Vicodin [Hydrocodone-Acetaminophen] Nausea And Vomiting  . Propoxyphene N-Acetaminophen Nausea And Vomiting    Current Outpatient Prescriptions on File Prior to Visit  Medication Sig Dispense Refill  . amLODipine (NORVASC) 10 MG tablet Take 1 tablet (10 mg total) by mouth daily. 90 tablet 1  . Calcium Carbonate-Vitamin D (  CALTRATE 600+D) 600-400 MG-UNIT per tablet Take 1 tablet by mouth 2 (two) times daily.    . carbidopa-levodopa (SINEMET) 25-100 MG per tablet 1/2 to 1 tab by mouth at bedtime for restless legs (Patient taking differently: 1/2 to 1 tab by mouth at bedtime as needed for restless legs) 30 tablet 2  . omeprazole (PRILOSEC) 40 MG capsule Take 1 capsule (40 mg total) by mouth daily. (Patient taking differently: Take 40 mg by mouth daily as needed. ) 30 capsule 3  . PATADAY 0.2 % SOLN Place 1 drop into both eyes daily.  12   No current facility-administered medications on file prior to visit.    BP 122/74 mmHg  Pulse 63  Temp(Src) 97.4 F (36.3 C) (Oral)  Resp 16  Ht 5' 7.5" (1.715 m)  Wt 229 lb (103.874 kg)  BMI 35.32 kg/m2  SpO2 99%  LMP 02/28/2007    Objective:   Physical Exam  Constitutional: She appears well-developed and well-nourished.  HENT:  Right Ear: Tympanic membrane and ear canal normal.  Left Ear: Tympanic membrane and ear canal normal.   Mouth/Throat: No posterior oropharyngeal edema or posterior oropharyngeal erythema.  Cardiovascular: Normal rate, regular rhythm and normal heart sounds.   No murmur heard. Pulmonary/Chest: Effort normal and breath sounds normal. No respiratory distress. She has no wheezes.  Musculoskeletal: She exhibits no edema.  Lymphadenopathy:    She has no cervical adenopathy.  Neurological: She is alert.  Skin: Skin is warm and dry.  Psychiatric: She has a normal mood and affect. Her behavior is normal. Judgment and thought content normal.          Assessment & Plan:

## 2014-11-09 NOTE — Patient Instructions (Addendum)
Please complete lab work prior to leaving.  Please complete your ultrasound on the first floor.   Follow up in 4 months.

## 2014-11-10 ENCOUNTER — Encounter: Payer: Self-pay | Admitting: Family

## 2014-11-30 ENCOUNTER — Ambulatory Visit (INDEPENDENT_AMBULATORY_CARE_PROVIDER_SITE_OTHER): Payer: 59 | Admitting: Podiatry

## 2014-11-30 DIAGNOSIS — M779 Enthesopathy, unspecified: Secondary | ICD-10-CM | POA: Diagnosis not present

## 2014-11-30 MED ORDER — MELOXICAM 15 MG PO TABS: 15.0000 mg | ORAL_TABLET | Freq: Every day | ORAL | Status: DC

## 2014-11-30 MED ORDER — TRIAMCINOLONE ACETONIDE 10 MG/ML IJ SUSP
10.0000 mg | Freq: Once | INTRAMUSCULAR | Status: AC
  Administered 2014-11-30: 10 mg

## 2014-12-03 NOTE — Progress Notes (Signed)
Subjective:     Patient ID: Nancy Deleon, female   DOB: 03-15-1956, 60 y.o.   MRN: [id number]  HPI patient presents stating my left ankle has been bothering me again   Review of Systems     Objective:   Physical Exam Neurovascular status intact muscle strength adequate with discomfort in the left ankle within the sinus tarsi with inflammation present    Assessment:     Reoccurrence sinus tarsitis left    Plan:     Sterile prep applied and injected the left sinus tarsi 3 mg Kenalog 5 mg Xylocaine to reduce inflammation. Reappoint in the next 2 weeks

## 2014-12-04 ENCOUNTER — Telehealth: Payer: Self-pay | Admitting: *Deleted

## 2014-12-04 MED ORDER — MELOXICAM 15 MG PO TABS: 15.0000 mg | ORAL_TABLET | Freq: Every day | ORAL | Status: DC

## 2014-12-04 NOTE — Telephone Encounter (Signed)
"  Dr. Paulla Dolly was supposed to have sent me a prescription to [facility] and they are saying they don't have it."  He sent it to [facility] on [address] in [place], [place].  Are you there now?  "Yes, I am right out side."  I'm going to send it again.  Let me know if they don't get it.  "Okay, let me go back in and ask.  They said they got it this time.  Thank you."

## 2015-02-03 ENCOUNTER — Other Ambulatory Visit: Payer: Self-pay | Admitting: Gynecology

## 2015-02-03 DIAGNOSIS — Z1231 Encounter for screening mammogram for malignant neoplasm of breast: Secondary | ICD-10-CM

## 2015-02-09 ENCOUNTER — Ambulatory Visit (HOSPITAL_COMMUNITY)
Admission: RE | Admit: 2015-02-09 | Discharge: 2015-02-09 | Disposition: A | Discharge status: Home / Self Care | Payer: 59 | Source: Ambulatory Visit | Attending: Gynecology | Admitting: Gynecology

## 2015-02-09 DIAGNOSIS — Z1231 Encounter for screening mammogram for malignant neoplasm of breast: Secondary | ICD-10-CM | POA: Diagnosis not present

## 2015-02-22 ENCOUNTER — Other Ambulatory Visit: Payer: Self-pay | Admitting: Internal Medicine

## 2015-03-02 ENCOUNTER — Telehealth: Payer: Self-pay | Admitting: *Deleted

## 2015-03-02 MED ORDER — AMLODIPINE BESYLATE 10 MG PO TABS: 10.0000 mg | ORAL_TABLET | Freq: Every day | ORAL | Status: DC

## 2015-03-02 NOTE — Telephone Encounter (Signed)
Received fax request from [facility] for Amlodipine.  Rx sent.

## 2015-03-12 ENCOUNTER — Ambulatory Visit: Payer: 59 | Admitting: Family

## 2015-03-23 ENCOUNTER — Ambulatory Visit: Payer: 59 | Admitting: Family

## 2015-03-23 ENCOUNTER — Telehealth: Payer: Self-pay | Admitting: Family

## 2015-03-26 ENCOUNTER — Ambulatory Visit (INDEPENDENT_AMBULATORY_CARE_PROVIDER_SITE_OTHER): Payer: 59 | Admitting: Gynecology

## 2015-03-26 ENCOUNTER — Encounter: Payer: Self-pay | Admitting: Gynecology

## 2015-03-26 VITALS — BP 124/80 | Ht 67.0 in | Wt 229.0 lb

## 2015-03-26 DIAGNOSIS — Z01419 Encounter for gynecological examination (general) (routine) without abnormal findings: Secondary | ICD-10-CM | POA: Diagnosis not present

## 2015-03-26 DIAGNOSIS — N952 Postmenopausal atrophic vaginitis: Secondary | ICD-10-CM | POA: Diagnosis not present

## 2015-03-26 NOTE — Patient Instructions (Signed)

## 2015-03-26 NOTE — Telephone Encounter (Signed)
Yes please

## 2015-03-26 NOTE — Telephone Encounter (Signed)
Pt was no show 03/23/15 8:45am, follow up appt, pt called today and rescheduled for 04/07/15, no reason given, 2nd no show, charge for no show?

## 2015-03-26 NOTE — Progress Notes (Signed)
Nancy Deleon [birth year] [id number]        59 y.o.  G1P1 for annual exam.  Doing well without complaints.  Past medical history,surgical history, problem list, medications, allergies, family history and social history were all reviewed and documented as reviewed in the EPIC chart.  ROS:  Performed with pertinent positives and negatives included in the history, assessment and plan.   Additional significant findings :   None   Exam: Kim assistant Filed Vitals:   03/26/15 0954  BP: 124/80  Height: 5\' 7"  (1.702 m)  Weight: 229 lb (103.874 kg)   General appearance:  Normal affect, orientation and appearance. Skin: Grossly normal HEENT: Without gross lesions.  No cervical or supraclavicular adenopathy. Thyroid normal.  Lungs:  Clear without wheezing, rales or rhonchi Cardiac: RR, without RMG Abdominal:  Soft, nontender, without masses, guarding, rebound, organomegaly or hernia Breasts:  Examined lying and sitting without masses, retractions, discharge or axillary adenopathy. Pelvic:  Ext/BUS/vagina with atrophic changes  Cervix with atrophic changes  Uterus anteverted, normal size, shape and contour, midline and mobile nontender   Adnexa  Without masses or tenderness    Anus and perineum  Normal   Rectovaginal  Normal sphincter tone without palpated masses or tenderness.    Assessment/Plan:  59 y.o. G1P1 female for annual exam.   1. Postmenopausal/atrophic genital changes. Without significant hot flushes, night sweats, vaginal dryness or any vaginal bleeding. Continue to monitor and report any issues or bleeding. 2. Pap smear/HPV negative 2015. No Pap smear done today. Repeated 3-5 year interval per current screening guidelines. 3. Mammography 02/2015. Continue with annual mammography. SBE monthly review. 4. DEXA 2013 normal. Repeat at age 35. Increased calcium vitamin D reviewed. 5. Colonoscopy 2011. Repeat at their recommended interval. 6. Health maintenance. No routine blood  work done as she reports this is done at her primary physician's office. Follow up in one year, sooner as needed.   Anastasio Auerbach MD, 10:18 AM 03/26/2015

## 2015-03-27 LAB — URINALYSIS W MICROSCOPIC + REFLEX CULTURE
Bacteria, UA: NONE SEEN [HPF]
Bilirubin Urine: NEGATIVE
CASTS: NONE SEEN [LPF]
CRYSTALS: NONE SEEN [HPF]
Glucose, UA: NEGATIVE
Hgb urine dipstick: NEGATIVE
KETONES UR: NEGATIVE
Leukocytes, UA: NEGATIVE
Nitrite: NEGATIVE
PROTEIN: NEGATIVE
Specific Gravity, Urine: 1.02 (ref 1.001–1.035)
Yeast: NONE SEEN [HPF]
pH: 7 (ref 5.0–8.0)

## 2015-03-30 ENCOUNTER — Other Ambulatory Visit: Payer: Self-pay | Admitting: Gynecology

## 2015-03-30 LAB — URINE CULTURE

## 2015-03-30 MED ORDER — AMPICILLIN 500 MG PO CAPS: 500.0000 mg | ORAL_CAPSULE | Freq: Four times a day (QID) | ORAL | Status: DC

## 2015-04-07 ENCOUNTER — Ambulatory Visit: Payer: 59 | Admitting: Family

## 2015-04-16 ENCOUNTER — Emergency Department (HOSPITAL_BASED_OUTPATIENT_CLINIC_OR_DEPARTMENT_OTHER)
Admission: EM | Admit: 2015-04-16 | Discharge: 2015-04-16 | Disposition: A | Discharge status: Home / Self Care | Payer: 59 | Attending: Emergency Medicine | Admitting: Emergency Medicine

## 2015-04-16 ENCOUNTER — Encounter (HOSPITAL_BASED_OUTPATIENT_CLINIC_OR_DEPARTMENT_OTHER): Payer: Self-pay | Admitting: *Deleted

## 2015-04-16 DIAGNOSIS — Z87891 Personal history of nicotine dependence: Secondary | ICD-10-CM | POA: Insufficient documentation

## 2015-04-16 DIAGNOSIS — M62838 Other muscle spasm: Secondary | ICD-10-CM | POA: Insufficient documentation

## 2015-04-16 DIAGNOSIS — Z79899 Other long term (current) drug therapy: Secondary | ICD-10-CM | POA: Diagnosis not present

## 2015-04-16 DIAGNOSIS — Z791 Long term (current) use of non-steroidal anti-inflammatories (NSAID): Secondary | ICD-10-CM | POA: Diagnosis not present

## 2015-04-16 DIAGNOSIS — M542 Cervicalgia: Secondary | ICD-10-CM | POA: Diagnosis present

## 2015-04-16 DIAGNOSIS — I10 Essential (primary) hypertension: Secondary | ICD-10-CM | POA: Diagnosis not present

## 2015-04-16 DIAGNOSIS — K219 Gastro-esophageal reflux disease without esophagitis: Secondary | ICD-10-CM | POA: Diagnosis not present

## 2015-04-16 DIAGNOSIS — E669 Obesity, unspecified: Secondary | ICD-10-CM | POA: Diagnosis not present

## 2015-04-16 MED ORDER — MELOXICAM 15 MG PO TABS: 15.0000 mg | ORAL_TABLET | Freq: Every day | ORAL | Status: DC

## 2015-04-16 MED ORDER — METHOCARBAMOL 500 MG PO TABS: 500.0000 mg | ORAL_TABLET | Freq: Two times a day (BID) | ORAL | Status: DC

## 2015-04-16 MED ORDER — METHOCARBAMOL 500 MG PO TABS
1000.0000 mg | ORAL_TABLET | Freq: Once | ORAL | Status: AC
  Administered 2015-04-16: 1000 mg via ORAL
  Filled 2015-04-16: qty 2

## 2015-04-16 NOTE — Discharge Instructions (Signed)
Muscle Cramps and Spasms °Muscle cramps and spasms occur when a muscle or muscles tighten and you have no control over this tightening (involuntary muscle contraction). They are a common problem and can develop in any muscle. The most common place is in the calf muscles of the leg. Both muscle cramps and muscle spasms are involuntary muscle contractions, but they also have differences:  °· Muscle cramps are sporadic and painful. They may last a few seconds to a quarter of an hour. Muscle cramps are often more forceful and last longer than muscle spasms. °· Muscle spasms may or may not be painful. They may also last just a few seconds or much longer. °CAUSES  °It is uncommon for cramps or spasms to be due to a serious underlying problem. In many cases, the cause of cramps or spasms is unknown. Some common causes are:  °· Overexertion.   °· Overuse from repetitive motions (doing the same thing over and over).   °· Remaining in a certain position for a long period of time.   °· Improper preparation, form, or technique while performing a sport or activity.   °· Dehydration.   °· Injury.   °· Side effects of some medicines.   °· Abnormally low levels of the salts and ions in your blood (electrolytes), especially potassium and calcium. This could happen if you are taking water pills (diuretics) or you are pregnant.   °Some underlying medical problems can make it more likely to develop cramps or spasms. These include, but are not limited to:  °· Diabetes.   °· Parkinson disease.   °· Hormone disorders, such as thyroid problems.   °· Alcohol abuse.   °· Diseases specific to muscles, joints, and bones.   °· Blood vessel disease where not enough blood is getting to the muscles.   °HOME CARE INSTRUCTIONS  °· Stay well hydrated. Drink enough water and fluids to keep your urine clear or pale yellow. °· It may be helpful to massage, stretch, and relax the affected muscle. °· For tight or tense muscles, use a warm towel, heating  pad, or hot shower water directed to the affected area. °· If you are sore or have pain after a cramp or spasm, applying ice to the affected area may relieve discomfort. °· Put ice in a plastic bag. °· Place a towel between your skin and the bag. °· Leave the ice on for 15-20 minutes, 03-04 times a day. °· Medicines used to treat a known cause of cramps or spasms may help reduce their frequency or severity. Only take over-the-counter or prescription medicines as directed by your caregiver. °SEEK MEDICAL CARE IF:  °Your cramps or spasms get more severe, more frequent, or do not improve over time.  °MAKE SURE YOU:  °· Understand these instructions. °· Will watch your condition. °· Will get help right away if you are not doing well or get worse. °Document Released: 01/13/2002 Document Revised: 11/18/2012 Document Reviewed: 07/10/2012 °[employer]® Patient Information ©2015 [employer]. This information is not intended to replace advice given to you by your health care provider. Make sure you discuss any questions you have with your health care provider. ° °Heat Therapy °Heat therapy can help make painful, stiff muscles and joints feel better. Do not use heat on new injuries. Wait at least 48 hours after an injury to use heat. Do not use heat when you have aches or pains right after an activity. If you still have pain 3 hours after stopping the activity, then you may use heat. °HOME CARE °Wet heat pack °· Soak   a clean towel in warm water. Squeeze out the extra water.  Put the warm, wet towel in a plastic bag.  Place a thin, dry towel between your skin and the bag.  Put the heat pack on the area for 5 minutes, and check your skin. Your skin may be pink, but it should not be red.  Leave the heat pack on the area for 15 to 30 minutes.  Repeat this every 2 to 4 hours while awake. Do not use heat while you are sleeping. Warm water bath  Fill a tub with warm water.  Place the affected body part in the  tub.  Soak the area for 20 to 40 minutes.  Repeat as needed. Hot water bottle  Fill the water bottle half full with hot water.  Press out the extra air. Close the cap tightly.  Place a dry towel between your skin and the bottle.  Put the bottle on the area for 5 minutes, and check your skin. Your skin may be pink, but it should not be red.  Leave the bottle on the area for 15 to 30 minutes.  Repeat this every 2 to 4 hours while awake. Electric heating pad  Place a dry towel between your skin and the heating pad.  Set the heating pad on low heat.  Put the heating pad on the area for 10 minutes, and check your skin. Your skin may be pink, but it should not be red.  Leave the heating pad on the area for 20 to 40 minutes.  Repeat this every 2 to 4 hours while awake.  Do not lie on the heating pad.  Do not fall asleep while using the heating pad.  Do not use the heating pad near water. GET HELP RIGHT AWAY IF:  You get blisters or red skin.  Your skin is puffy (swollen), or you lose feeling (numbness) in the affected area.  You have any new problems.  Your problems are getting worse.  You have any questions or concerns. If you have any problems, stop using heat therapy until you see your doctor. MAKE SURE YOU:  Understand these instructions.  Will watch your condition.  Will get help right away if you are not doing well or get worse. Document Released: 10/16/2011 Document Reviewed: 09/16/2013 [facility] Patient Information 2015 [place]. This information is not intended to replace advice given to you by your health care provider. Make sure you discuss any questions you have with your health care provider.  Heat Therapy Heat therapy can help make painful, stiff muscles and joints feel better. Do not use heat on new injuries. Wait at least 48 hours after an injury to use heat. Do not use heat when you have aches or pains right after an activity. If you still  have pain 3 hours after stopping the activity, then you may use heat. HOME CARE Wet heat pack  Soak a clean towel in warm water. Squeeze out the extra water.  Put the warm, wet towel in a plastic bag.  Place a thin, dry towel between your skin and the bag.  Put the heat pack on the area for 5 minutes, and check your skin. Your skin may be pink, but it should not be red.  Leave the heat pack on the area for 15 to 30 minutes.  Repeat this every 2 to 4 hours while awake. Do not use heat while you are sleeping. Warm water bath  Fill a tub with  warm water.  Place the affected body part in the tub.  Soak the area for 20 to 40 minutes.  Repeat as needed. Hot water bottle  Fill the water bottle half full with hot water.  Press out the extra air. Close the cap tightly.  Place a dry towel between your skin and the bottle.  Put the bottle on the area for 5 minutes, and check your skin. Your skin may be pink, but it should not be red.  Leave the bottle on the area for 15 to 30 minutes.  Repeat this every 2 to 4 hours while awake. Electric heating pad  Place a dry towel between your skin and the heating pad.  Set the heating pad on low heat.  Put the heating pad on the area for 10 minutes, and check your skin. Your skin may be pink, but it should not be red.  Leave the heating pad on the area for 20 to 40 minutes.  Repeat this every 2 to 4 hours while awake.  Do not lie on the heating pad.  Do not fall asleep while using the heating pad.  Do not use the heating pad near water. GET HELP RIGHT AWAY IF:  You get blisters or red skin.  Your skin is puffy (swollen), or you lose feeling (numbness) in the affected area.  You have any new problems.  Your problems are getting worse.  You have any questions or concerns. If you have any problems, stop using heat therapy until you see your doctor. MAKE SURE YOU:  Understand these instructions.  Will watch your  condition.  Will get help right away if you are not doing well or get worse. Document Released: 10/16/2011 Document Reviewed: 09/16/2013 [facility] Patient Information 2015 [place]. This information is not intended to replace advice given to you by your health care provider. Make sure you discuss any questions you have with your health care provider.

## 2015-04-16 NOTE — ED Notes (Signed)
Neck pain with radiation into her head since yesterday.

## 2015-04-16 NOTE — ED Provider Notes (Signed)
CSN: [id number]     Arrival date & time 04/16/15  2030 History  This chart was scribed for Kawana Hegel, MD by Rayna Sexton, ED scribe. This patient was seen in room MH12/MH12 and the patient's care was started at 11:24 PM.   Chief Complaint  Patient presents with  . Neck Pain   Patient is a 59 y.o. female presenting with neck pain. The history is provided by the patient. No language interpreter was used.  Neck Pain Pain location:  Generalized neck Quality:  Aching Pain radiates to:  Does not radiate Pain severity:  Moderate Pain is:  Same all the time Onset quality:  Sudden Duration:  1 day Timing:  Constant Progression:  Unchanged Chronicity:  New Context: not fall and not MCA   Relieved by:  Nothing Worsened by:  Position and twisting Associated symptoms: no bladder incontinence, no leg pain, no photophobia, no visual change, no weakness and no weight loss   Risk factors: no hx of head and neck radiation     HPI Comments: Nancy Deleon is a 59 y.o. female who presents to the Emergency Department complaining of constant, moderate, radiating, neck pain with onset 1 day ago. She notes taking ibuprofen for pain management with little relief. Pt notes a worsening of her pain with movement. Pt denies any trauma or other associated symptoms.   Past Medical History  Diagnosis Date  . Hypertension   . Obesity   . Fatty liver 04/03/2014  . Allergy   . GERD (gastroesophageal reflux disease)    Past Surgical History  Procedure Laterality Date  . Tubal ligation    . Cesarean section  [id number]  . Breast biopsy  04/24/11    rigth breast  . Colonoscopy    . Upper gastrointestinal endoscopy     Family History  Problem Relation Age of Onset  . Lymphoma Mother   . Dementia Mother   . Cancer Father     lung  . Colon cancer Maternal Uncle   . Esophageal cancer Neg Hx   . Pancreatic cancer Neg Hx   . Rectal cancer Neg Hx   . Stomach cancer Neg Hx    Social History  Substance  Use Topics  . Smoking status: Former Smoker    Quit date: 04/18/1985  . Smokeless tobacco: Never Used  . Alcohol Use: 0.0 oz/week    0 Standard drinks or equivalent per week     Comment: occ   OB History    Gravida Para Term Preterm AB TAB SAB Ectopic Multiple Living   1 1        1      Review of Systems  Constitutional: Negative for weight loss.  HENT: Negative for congestion.   Eyes: Negative for photophobia.  Genitourinary: Negative for bladder incontinence.  Musculoskeletal: Positive for neck pain.  Neurological: Negative for weakness.  All other systems reviewed and are negative.  Allergies  Naproxen; Ropinirole; Vicodin; and Propoxyphene n-acetaminophen  Home Medications   Prior to Admission medications   Medication Sig Start Date End Date Taking? Authorizing Provider  amLODipine (NORVASC) 10 MG tablet Take 1 tablet (10 mg total) by mouth daily. 03/02/15   [name], NP  ampicillin (PRINCIPEN) 500 MG capsule Take 1 capsule (500 mg total) by mouth 4 (four) times daily. 03/30/15   Anastasio Auerbach, MD  Calcium Carbonate-Vitamin D (CALTRATE 600+D) 600-400 MG-UNIT per tablet Take 1 tablet by mouth 2 (two) times daily.    Historical Provider, MD  meloxicam (MOBIC) 15 MG tablet Take 1 tablet (15 mg total) by mouth daily. 12/04/14   Wallene Huh, DPM  omeprazole (PRILOSEC) 40 MG capsule Take 1 capsule (40 mg total) by mouth daily. Patient taking differently: Take 40 mg by mouth daily as needed.  10/13/14   [name], NP  PATADAY 0.2 % SOLN Place 1 drop into both eyes daily. 07/29/14   Historical Provider, MD   BP 139/66 mmHg  Pulse 70  Temp(Src) 98.5 F (36.9 C) (Oral)  Resp 18  Ht 5\' 7"  (1.702 m)  Wt 229 lb (103.874 kg)  BMI 35.86 kg/m2  SpO2 100%  LMP 02/28/2007 Physical Exam  Constitutional: She is oriented to person, place, and time. She appears well-developed.  HENT:  Head: Normocephalic and atraumatic.  Right Ear: External ear normal.  Left  Ear: External ear normal.  Mouth/Throat: Oropharynx is clear and moist.  Eyes: EOM are normal. Pupils are equal, round, and reactive to light.  Neck: No tracheal deviation present. No thyromegaly present.  Cardiovascular: Normal rate, regular rhythm, normal heart sounds and intact distal pulses.  Exam reveals no gallop and no friction rub.   No murmur heard. Pulmonary/Chest: Effort normal and breath sounds normal. No respiratory distress. She has no wheezes. She has no rales.  Abdominal: Soft.  Musculoskeletal:  Spasm in left trapezius muscle; no lymphadenopathy   Lymphadenopathy:    She has no cervical adenopathy.  Neurological: She is alert and oriented to person, place, and time. She has normal reflexes.  5/5 strength in bilateral UE's   Nursing note and vitals reviewed.   ED Course  Procedures  DIAGNOSTIC STUDIES: Oxygen Saturation is 100% on RA, normal by my interpretation.    COORDINATION OF CARE: 11:28 PM Discussed treatment plan with pt at bedside and pt agreed to plan.  Labs Review Labs Reviewed - No data to display  Imaging Review No results found. I have personally reviewed and evaluated these images and lab results as part of my medical decision-making.   EKG Interpretation None      MDM   Final diagnoses:  None    Muscle spasm in the neck will treat with NSAIDs and muscle relaxers.  PRN follow up with sports medicine  I personally performed the services described in this documentation, which was scribed in my presence. The recorded information has been reviewed and is accurate.    Veatrice Kells, MD 04/17/15 [phone]

## 2015-04-17 ENCOUNTER — Encounter (HOSPITAL_BASED_OUTPATIENT_CLINIC_OR_DEPARTMENT_OTHER): Payer: Self-pay | Admitting: Emergency Medicine

## 2015-04-28 ENCOUNTER — Ambulatory Visit (INDEPENDENT_AMBULATORY_CARE_PROVIDER_SITE_OTHER): Payer: 59 | Admitting: Family

## 2015-04-28 ENCOUNTER — Encounter: Payer: Self-pay | Admitting: Family

## 2015-04-28 ENCOUNTER — Telehealth: Payer: Self-pay | Admitting: *Deleted

## 2015-04-28 VITALS — BP 128/70 | HR 60 | Temp 98.1°F | Resp 16 | Ht 67.5 in | Wt 230.8 lb

## 2015-04-28 DIAGNOSIS — I10 Essential (primary) hypertension: Secondary | ICD-10-CM | POA: Diagnosis not present

## 2015-04-28 DIAGNOSIS — M542 Cervicalgia: Secondary | ICD-10-CM

## 2015-04-28 DIAGNOSIS — K219 Gastro-esophageal reflux disease without esophagitis: Secondary | ICD-10-CM

## 2015-04-28 DIAGNOSIS — R21 Rash and other nonspecific skin eruption: Secondary | ICD-10-CM

## 2015-04-28 DIAGNOSIS — Z23 Encounter for immunization: Secondary | ICD-10-CM | POA: Diagnosis not present

## 2015-04-28 DIAGNOSIS — R739 Hyperglycemia, unspecified: Secondary | ICD-10-CM | POA: Diagnosis not present

## 2015-04-28 LAB — HEMOGLOBIN A1C: Hgb A1c MFr Bld: 6.1 % (ref 4.6–6.5)

## 2015-04-28 LAB — BASIC METABOLIC PANEL
BUN: 17 mg/dL (ref 6–23)
CHLORIDE: 106 meq/L (ref 96–112)
CO2: 33 meq/L — AB (ref 19–32)
Calcium: 9.4 mg/dL (ref 8.4–10.5)
Creatinine, Ser: 0.92 mg/dL (ref 0.40–1.20)
GFR: 80.5 mL/min (ref >60.00)
GLUCOSE: 129 mg/dL — AB (ref 70–99)
POTASSIUM: 4 meq/L (ref 3.5–5.1)
SODIUM: 142 meq/L (ref 135–145)

## 2015-04-28 MED ORDER — CYCLOBENZAPRINE HCL 5 MG PO TABS: 5.0000 mg | ORAL_TABLET | Freq: Three times a day (TID) | ORAL | Status: DC | PRN

## 2015-04-28 MED ORDER — ESOMEPRAZOLE MAGNESIUM 40 MG PO CPDR: 40.0000 mg | DELAYED_RELEASE_CAPSULE | Freq: Every day | ORAL | Status: DC

## 2015-04-28 MED ORDER — MELOXICAM 7.5 MG PO TABS: 7.5000 mg | ORAL_TABLET | Freq: Every day | ORAL | Status: DC

## 2015-04-28 NOTE — Progress Notes (Signed)
Pre visit review using our clinic review tool, if applicable. No additional management support is needed unless otherwise documented below in the visit note. 

## 2015-04-28 NOTE — Progress Notes (Signed)
Subjective:    Patient ID: Nancy Deleon, female    DOB: July 20, 1956, 59 y.o.   MRN: [id number]  HPI  Nancy Deleon is a 59 yr old female who presents today for follow up of multiple medical problems.   HTN-  Denies CP/SOB or swelling, continues amlodipine. BP Readings from Last 3 Encounters:  04/28/15 128/70  04/16/15 143/77  03/26/15 124/80   GERD-  Was on nexium in the past, worked better on than omeprazole but insurance did not cover.   Hyperglycemia-  Lab Results  Component Value Date   HGBA1C 6.2 11/09/2014   Reports that she had some muscle spasms in her neck.  Has a tingling sensation.    Reports rash around her lips, mildly itching. No changes in cosmetics   Review of Systems    see HPI  Past Medical History  Diagnosis Date  . Hypertension   . Obesity   . Fatty liver 04/03/2014  . Allergy   . GERD (gastroesophageal reflux disease)     Social History   Social History  . Marital Status: Widowed    Spouse Name: N/A  . Number of Children: 2  . Years of Education: N/A   Occupational History  .     Social History Main Topics  . Smoking status: Former Smoker    Quit date: 04/18/1985  . Smokeless tobacco: Never Used  . Alcohol Use: 0.0 oz/week    0 Standard drinks or equivalent per week     Comment: occ  . Drug Use: No  . Sexual Activity:    Partners: Male    Birth Control/ Protection: Post-menopausal     Comment: 1st intercourse 59 yo-Fewer than 5 partners   Other Topics Concern  . Not on file   Social History Narrative   Regular exercise:  Walks 2-5 x weekly   Caffeine Use: no   Widowed   She has a son [name]- he is 3 yrs old.   Foster parent.   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   Dog (Lab named Abby)             Past Surgical History  Procedure Laterality Date  . Tubal ligation    . Cesarean section  [id number]  . Breast biopsy  04/24/11    rigth breast  . Colonoscopy     . Upper gastrointestinal endoscopy      Family History  Problem Relation Age of Onset  . Lymphoma Mother   . Dementia Mother   . Cancer Father     lung  . Colon cancer Maternal Uncle   . Esophageal cancer Neg Hx   . Pancreatic cancer Neg Hx   . Rectal cancer Neg Hx   . Stomach cancer Neg Hx     Allergies  Allergen Reactions  . Naproxen     REACTION: vomiting  . Ropinirole Nausea Only  . Vicodin [Hydrocodone-Acetaminophen] Nausea And Vomiting  . Propoxyphene N-Acetaminophen Nausea And Vomiting    Current Outpatient Prescriptions on File Prior to Visit  Medication Sig Dispense Refill  . amLODipine (NORVASC) 10 MG tablet Take 1 tablet (10 mg total) by mouth daily. 90 tablet 1  . Calcium Carbonate-Vitamin D (CALTRATE 600+D) 600-400 MG-UNIT per tablet Take 1 tablet by mouth 2 (two) times daily.    Marland Kitchen PATADAY 0.2 % SOLN Place 1 drop into both eyes daily.  12  . ampicillin (  PRINCIPEN) 500 MG capsule Take 1 capsule (500 mg total) by mouth 4 (four) times daily. (Patient not taking: Reported on 04/28/2015) 12 capsule 0   No current facility-administered medications on file prior to visit.    BP 128/70 mmHg  Pulse 60  Temp(Src) 98.1 F (36.7 C) (Oral)  Resp 16  Ht 5' 7.5" (1.715 m)  Wt 230 lb 12.8 oz (104.69 kg)  BMI 35.59 kg/m2  SpO2 98%  LMP 02/28/2007    Objective:   Physical Exam  Constitutional: She is oriented to person, place, and time. She appears well-developed and well-nourished.  Cardiovascular: Normal rate and regular rhythm.   Murmur heard.  Systolic murmur is present with a grade of 2/6  Pulmonary/Chest: Effort normal and breath sounds normal. No respiratory distress. She has no wheezes.  Musculoskeletal: She exhibits no edema.       Cervical back: She exhibits no tenderness.  Neurological: She is alert and oriented to person, place, and time.  Bilateral UE strengthis 5/5, neg phalans, neg tinnels  Psychiatric: She has a normal mood and affect. Her  behavior is normal. Judgment and thought content normal.          Assessment & Plan:  Rash- very mild rash around her lips.  Advised trial of otc hydrocortisone bid.  Neck pain- rx with meloxicam,flexeril, consider additional imaging if symptoms worsen or do not improve.

## 2015-04-28 NOTE — Patient Instructions (Signed)
Start meloxicam for neck pain, you may use flexeril (muscle relaxer) as needed for neck pain. Stop omeprazole, start nexium- let me know if you have trouble getting the medicine. Let me know if skin rash worsens or does not improve in 1 week. Complete lab work prior to leaving.

## 2015-04-28 NOTE — Telephone Encounter (Signed)
Received fax from [facility] that pt's insurance doesn't cover nexium.  Plan alternatives are:  Omeprazole, pantoprazole and rabeprazole. Pt has already tried omeprazole and pantoprazole. Please advise?

## 2015-04-29 MED ORDER — OMEPRAZOLE 40 MG PO CPDR: 40.0000 mg | DELAYED_RELEASE_CAPSULE | Freq: Every day | ORAL | Status: DC

## 2015-04-29 NOTE — Telephone Encounter (Signed)
Discussed with pt at her visit that if nexium is not covered we would plan to continue omeprazole.  Rx sent for omeprazole.

## 2015-04-29 NOTE — Telephone Encounter (Signed)
Called pt to make her aware of change.  Left message for call back.

## 2015-04-29 NOTE — Telephone Encounter (Signed)
Pt returned called, pt stated can call her at work [phone] since during the day at work.

## 2015-04-30 ENCOUNTER — Telehealth: Payer: Self-pay | Admitting: Family

## 2015-04-30 DIAGNOSIS — R011 Cardiac murmur, unspecified: Secondary | ICD-10-CM

## 2015-04-30 NOTE — Assessment & Plan Note (Signed)
Follow up A1C  Lab Results  Component Value Date   HGBA1C 6.1 04/28/2015   Stable.

## 2015-04-30 NOTE — Assessment & Plan Note (Signed)
BP stable on current meds. Continue same. Obtain bmet 

## 2015-04-30 NOTE — Telephone Encounter (Signed)
Please let pt know that I heard a soft murmur on her exam when she was here and I would like her to complete an ultrasound of her heart to evaluate.

## 2015-04-30 NOTE — Telephone Encounter (Signed)
Left detailed message on pt's cell# re: below recommendation and to call if any questions. 

## 2015-04-30 NOTE — Telephone Encounter (Signed)
Pt notified and made aware.  She stated understanding and agrees with plan.  Echocardiogram ordered.

## 2015-04-30 NOTE — Assessment & Plan Note (Signed)
Pt wanted to go back on nexium but unfortunately, insurance will not cover. Continue omeprazole.

## 2015-05-07 ENCOUNTER — Other Ambulatory Visit: Payer: Self-pay

## 2015-05-07 ENCOUNTER — Ambulatory Visit (HOSPITAL_COMMUNITY): Payer: 59 | Attending: Cardiovascular Disease

## 2015-05-07 DIAGNOSIS — R011 Cardiac murmur, unspecified: Secondary | ICD-10-CM

## 2015-05-07 DIAGNOSIS — Z6835 Body mass index (BMI) 35.0-35.9, adult: Secondary | ICD-10-CM | POA: Diagnosis not present

## 2015-05-07 DIAGNOSIS — Z87891 Personal history of nicotine dependence: Secondary | ICD-10-CM | POA: Diagnosis not present

## 2015-05-07 DIAGNOSIS — R010 Benign and innocent cardiac murmurs: Secondary | ICD-10-CM

## 2015-05-07 DIAGNOSIS — E669 Obesity, unspecified: Secondary | ICD-10-CM | POA: Insufficient documentation

## 2015-05-07 DIAGNOSIS — I10 Essential (primary) hypertension: Secondary | ICD-10-CM | POA: Diagnosis not present

## 2015-05-21 ENCOUNTER — Other Ambulatory Visit: Payer: Self-pay | Admitting: Family

## 2015-05-31 ENCOUNTER — Telehealth: Payer: Self-pay | Admitting: Family

## 2015-05-31 MED ORDER — BENZONATATE 100 MG PO CAPS: 100.0000 mg | ORAL_CAPSULE | Freq: Three times a day (TID) | ORAL | Status: DC | PRN

## 2015-05-31 NOTE — Telephone Encounter (Signed)
Caller name: Paulett Kaufhold  Relationship to patient: Self   Can be reached:[phone]  Pharmacy: [id number] - [place], [place] - [address] AT [address]   Reason for call: Pt says that she would like a script for cough medicine. She says the refill on the medicine that was prescribed before has expired. Pt says that she feels fine she just have a cough. She don't want it to get any worse. Informed pt that she may need an appt. She says that she will come in if she have to but would rather just have refill.    Please Advise. I will gladly follow up with pt if an appt is needed.    Thanks.

## 2015-05-31 NOTE — Telephone Encounter (Signed)
Rx sent for tessalon. If fever or if symptoms are not improved in 3-4 days, should be seen in office.

## 2015-05-31 NOTE — Telephone Encounter (Signed)
Pt called requesting if her medications was sent to [facility] at [facility] (below), looked at chart and meds was sent to [place], [place], Rx is needed to be sent to [facility] Please advise.

## 2015-05-31 NOTE — Telephone Encounter (Signed)
Spoke with pt and she voices understanding.  

## 2015-06-01 MED ORDER — BENZONATATE 100 MG PO CAPS: 100.0000 mg | ORAL_CAPSULE | Freq: Three times a day (TID) | ORAL | Status: DC | PRN

## 2015-06-01 NOTE — Telephone Encounter (Signed)
Attempted to call [facility] to cancel below Rx but they are not open yet. Faxed cancellation request to [facility] and resent Rx to [facility] as requested below. Notified pt.

## 2015-06-01 NOTE — Addendum Note (Signed)
Addended by: Kelle Darting A on: 06/01/2015 07:24 AM   Modules accepted: Orders

## 2015-06-07 ENCOUNTER — Telehealth: Payer: Self-pay | Admitting: Family

## 2015-06-07 NOTE — Telephone Encounter (Signed)
Caller name: Kasiya   Relationship to patient: Self   Can be reached: [phone]  Pharmacy:  Reason for call: Pt says that she came in and was prescribed medication for a rash and her cough. Pt says that neither of the medications are working for her. Pt isn't sure of the names of the 2 medications.    Please advise if pt need another appt.

## 2015-06-07 NOTE — Telephone Encounter (Signed)
Left message for pt to return my call.   Per 05/31/15 phone note, tessalon was prescribed for her cough and she was advised that she would need office visit if cough persisted 3-4 days. Pt was seen for rash on 04/28/15 and advised to let us know if rash was not improved in 1 week.  Pt should be seen for further recommendations.

## 2015-06-07 NOTE — Telephone Encounter (Signed)
Scheduled pt for 06/09/15 at 1:30     No call back is needed.

## 2015-06-09 ENCOUNTER — Ambulatory Visit (INDEPENDENT_AMBULATORY_CARE_PROVIDER_SITE_OTHER): Payer: 59 | Admitting: Family

## 2015-06-09 ENCOUNTER — Encounter: Payer: Self-pay | Admitting: Family

## 2015-06-09 VITALS — HR 63 | Temp 98.2°F | Resp 16 | Ht 67.5 in | Wt 231.6 lb

## 2015-06-09 DIAGNOSIS — R21 Rash and other nonspecific skin eruption: Secondary | ICD-10-CM

## 2015-06-09 DIAGNOSIS — J302 Other seasonal allergic rhinitis: Secondary | ICD-10-CM | POA: Diagnosis not present

## 2015-06-09 MED ORDER — MONTELUKAST SODIUM 10 MG PO TABS: 10.0000 mg | ORAL_TABLET | Freq: Every day | ORAL | Status: DC

## 2015-06-09 MED ORDER — LORATADINE 10 MG PO TABS: 10.0000 mg | ORAL_TABLET | Freq: Every day | ORAL | Status: DC

## 2015-06-09 MED ORDER — KETOCONAZOLE 2 % EX CREA: 1.0000 "application " | TOPICAL_CREAM | Freq: Every day | CUTANEOUS | Status: DC

## 2015-06-09 NOTE — Assessment & Plan Note (Signed)
Face- previously saw dermatology for this and was given rx for ketoconazole which helped. Refill proved.

## 2015-06-09 NOTE — Progress Notes (Signed)
Pre visit review using our clinic review tool, if applicable. No additional management support is needed unless otherwise documented below in the visit note. 

## 2015-06-09 NOTE — Progress Notes (Signed)
Subjective:    Patient ID: Nancy Deleon, female    DOB: 1955-10-14, 59 y.o.   MRN: [id number]  HPI   [name] is a 59 yr old female who presents today with chief complaint of cough. She reports that her cough has been present x 2 weeks and is productive of yellow phlegm. Cough is worse at night and not improved by tessalon.  Denies fever, does not feel ill.  + sneezing.   Skin rash- pt reports ongoing skin rash on her face since September.     Review of Systems Past Medical History  Diagnosis Date  . Hypertension   . Obesity   . Fatty liver 04/03/2014  . Allergy   . GERD (gastroesophageal reflux disease)     Social History   Social History  . Marital Status: Widowed    Spouse Name: N/A  . Number of Children: 2  . Years of Education: N/A   Occupational History  .     Social History Main Topics  . Smoking status: Former Smoker    Quit date: 04/18/1985  . Smokeless tobacco: Never Used  . Alcohol Use: 0.0 oz/week    0 Standard drinks or equivalent per week     Comment: occ  . Drug Use: No  . Sexual Activity:    Partners: Male    Birth Control/ Protection: Post-menopausal     Comment: 1st intercourse 59 yo-Fewer than 5 partners   Other Topics Concern  . Not on file   Social History Narrative   Regular exercise:  Walks 2-5 x weekly   Caffeine Use: no   Widowed   She has a son Nancy Deleon- he is 40 yrs old.   Foster parent.   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   Dog (Lab named Abby)             Past Surgical History  Procedure Laterality Date  . Tubal ligation    . Cesarean section  [id number]  . Breast biopsy  04/24/11    rigth breast  . Colonoscopy    . Upper gastrointestinal endoscopy      Family History  Problem Relation Age of Onset  . Lymphoma Mother   . Dementia Mother   . Cancer Father     lung  . Colon cancer Maternal Uncle   . Esophageal cancer Neg Hx   . Pancreatic cancer  Neg Hx   . Rectal cancer Neg Hx   . Stomach cancer Neg Hx     Allergies  Allergen Reactions  . Naproxen     REACTION: vomiting  . Ropinirole Nausea Only  . Vicodin [Hydrocodone-Acetaminophen] Nausea And Vomiting  . Propoxyphene N-Acetaminophen Nausea And Vomiting    Current Outpatient Prescriptions on File Prior to Visit  Medication Sig Dispense Refill  . amLODipine (NORVASC) 10 MG tablet Take 1 tablet by mouth  daily 90 tablet 0  . Calcium Carbonate-Vitamin D (CALTRATE 600+D) 600-400 MG-UNIT per tablet Take 1 tablet by mouth 2 (two) times daily.    Marland Kitchen omeprazole (PRILOSEC) 40 MG capsule Take 1 capsule (40 mg total) by mouth daily. 30 capsule 5   No current facility-administered medications on file prior to visit.    Pulse 63  Temp(Src) 98.2 F (36.8 C) (Oral)  Resp 16  Ht 5' 7.5" (1.715 m)  Wt 231 lb 9.6 oz (105.053 kg)  BMI 35.72 kg/m2  SpO2  100%  LMP 02/28/2007       Objective:   Physical Exam  Constitutional: She appears well-developed and well-nourished.  HENT:  Head: Normocephalic and atraumatic.  Right Ear: Tympanic membrane and ear canal normal.  Left Ear: Tympanic membrane and ear canal normal.  Nose: Right sinus exhibits no maxillary sinus tenderness and no frontal sinus tenderness. Left sinus exhibits no maxillary sinus tenderness and no frontal sinus tenderness.  Cardiovascular: Normal rate, regular rhythm and normal heart sounds.   No murmur heard. Pulmonary/Chest: Effort normal and breath sounds normal. No respiratory distress. She has no wheezes.  Skin:  Mildly hypopigmented patches noted above lip.  Psychiatric: She has a normal mood and affect. Her behavior is normal. Judgment and thought content normal.          Assessment & Plan:

## 2015-06-09 NOTE — Patient Instructions (Addendum)
Continue claritin once daily. Start singulair once daily. Add delsym as needed for cough.  Call if symptoms worsen or if not improved in 1 week.

## 2015-06-09 NOTE — Assessment & Plan Note (Signed)
Suspect cough related to post nasal drip. Declines nasal spray. Continue claritin, trial of singulair, delsym prn. Pt advised to call if symptoms worsen or do not improve.

## 2015-07-27 ENCOUNTER — Encounter: Payer: Self-pay | Admitting: Family

## 2015-07-27 ENCOUNTER — Ambulatory Visit (INDEPENDENT_AMBULATORY_CARE_PROVIDER_SITE_OTHER): Payer: 59 | Admitting: Family

## 2015-07-27 VITALS — BP 130/56 | HR 62 | Temp 98.7°F | Resp 16 | Ht 67.75 in | Wt 227.8 lb

## 2015-07-27 DIAGNOSIS — K219 Gastro-esophageal reflux disease without esophagitis: Secondary | ICD-10-CM

## 2015-07-27 DIAGNOSIS — R739 Hyperglycemia, unspecified: Secondary | ICD-10-CM | POA: Diagnosis not present

## 2015-07-27 DIAGNOSIS — I10 Essential (primary) hypertension: Secondary | ICD-10-CM

## 2015-07-27 LAB — BASIC METABOLIC PANEL
BUN: 10 mg/dL (ref 6–23)
CALCIUM: 9.6 mg/dL (ref 8.4–10.5)
CHLORIDE: 105 meq/L (ref 96–112)
CO2: 30 meq/L (ref 19–32)
CREATININE: 0.87 mg/dL (ref 0.40–1.20)
GFR: 85.79 mL/min (ref >60.00)
Glucose, Bld: 134 mg/dL — ABNORMAL HIGH (ref 70–99)
Potassium: 3.9 mEq/L (ref 3.5–5.1)
Sodium: 141 mEq/L (ref 135–145)

## 2015-07-27 LAB — HEMOGLOBIN A1C: Hgb A1c MFr Bld: 5.9 % (ref 4.6–6.5)

## 2015-07-27 MED ORDER — AMLODIPINE BESYLATE 10 MG PO TABS: 10.0000 mg | ORAL_TABLET | Freq: Every day | ORAL | Status: DC

## 2015-07-27 NOTE — Assessment & Plan Note (Signed)
BP stable on amlodipine, continue same.  

## 2015-07-27 NOTE — Assessment & Plan Note (Signed)
Stable.  Will see if she can tolerate d/c of PPI with gerd diet.

## 2015-07-27 NOTE — Assessment & Plan Note (Signed)
Obtain A1c.  

## 2015-07-27 NOTE — Patient Instructions (Signed)
Stop omeprazole.  See how you do off if it.  Let me know if you have recurrent reflux symptoms.    Food Choices for Gastroesophageal Reflux Disease, Adult When you have gastroesophageal reflux disease (GERD), the foods you eat and your eating habits are very important. Choosing the right foods can help ease your discomfort.  WHAT GUIDELINES DO I NEED TO FOLLOW?   Choose fruits, vegetables, whole grains, and low-fat dairy products.   Choose low-fat meat, fish, and poultry.  Limit fats such as oils, salad dressings, butter, nuts, and avocado.   Keep a food diary. This helps you identify foods that cause symptoms.   Avoid foods that cause symptoms. These may be different for everyone.   Eat small meals often instead of 3 large meals a day.   Eat your meals slowly, in a place where you are relaxed.   Limit fried foods.   Cook foods using methods other than frying.   Avoid drinking alcohol.   Avoid drinking large amounts of liquids with your meals.   Avoid bending over or lying down until 2-3 hours after eating.  WHAT FOODS ARE NOT RECOMMENDED?  These are some foods and drinks that may make your symptoms worse: Vegetables Tomatoes. Tomato juice. Tomato and spaghetti sauce. Chili peppers. Onion and garlic. Horseradish. Fruits Oranges, grapefruit, and lemon (fruit and juice). Meats High-fat meats, fish, and poultry. This includes hot dogs, ribs, ham, sausage, salami, and bacon. Dairy Whole milk and chocolate milk. Sour cream. Cream. Butter. Ice cream. Cream cheese.  Drinks Coffee and tea. Bubbly (carbonated) drinks or energy drinks. Condiments Hot sauce. Barbecue sauce.  Sweets/Desserts Chocolate and cocoa. Donuts. Peppermint and spearmint. Fats and Oils High-fat foods. This includes [place] fries and potato chips. Other Vinegar. Strong spices. This includes black pepper, white pepper, red pepper, cayenne, curry powder, cloves, ginger, and chili powder. The items  listed above may not be a complete list of foods and drinks to avoid. Contact your dietitian for more information.   This information is not intended to replace advice given to you by your health care provider. Make sure you discuss any questions you have with your health care provider.   Document Released: 01/23/2012 Document Revised: 08/14/2014 Document Reviewed: 05/28/2013 Elsevier Interactive Patient Education [employer].

## 2015-07-27 NOTE — Progress Notes (Signed)
Pre visit review using our clinic review tool, if applicable. No additional management support is needed unless otherwise documented below in the visit note. 

## 2015-07-27 NOTE — Progress Notes (Signed)
Subjective:    Patient ID: Nancy Deleon, female    DOB: 03/21/1956, 59 y.o.   MRN: [id number]  HPI   Nancy Deleon is a 58 yr old female who presents today for follow up.  1) HTN- She is maintained on amlodipine.  BP Readings from Last 3 Encounters:  07/27/15 130/56  04/28/15 128/70  04/16/15 143/77   2) GERD-maintained on PPI, she is maintained on omeprazole.  Misses some times, reports no gerd symptoms when she misses  3) Hyperglycemia-  Lab Results  Component Value Date   HGBA1C 6.1 04/28/2015     Reports + stress in caring for her mom with dementia- she has attended work shops for caregiver.    Review of Systems  Respiratory: Negative for shortness of breath.   Cardiovascular: Negative for chest pain.  skin: reports resolution of skin rash on face  Past Medical History  Diagnosis Date  . Hypertension   . Obesity   . Fatty liver 04/03/2014  . Allergy   . GERD (gastroesophageal reflux disease)     Social History   Social History  . Marital Status: Widowed    Spouse Name: N/A  . Number of Children: 2  . Years of Education: N/A   Occupational History  .     Social History Main Topics  . Smoking status: Former Smoker    Quit date: 04/18/1985  . Smokeless tobacco: Never Used  . Alcohol Use: 0.0 oz/week    0 Standard drinks or equivalent per week     Comment: occ  . Drug Use: No  . Sexual Activity:    Partners: Male    Birth Control/ Protection: Post-menopausal     Comment: 1st intercourse 59 yo-Fewer than 5 partners   Other Topics Concern  . Not on file   Social History Narrative   Regular exercise:  Walks 2-5 x weekly   Caffeine Use: no   Widowed   She has a son [name]- he is 1 yrs old.   Foster parent.   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   Dog (Lab named Abby)             Past Surgical History  Procedure Laterality Date  . Tubal ligation    . Cesarean section  [id number]   . Breast biopsy  04/24/11    rigth breast  . Colonoscopy    . Upper gastrointestinal endoscopy      Family History  Problem Relation Age of Onset  . Lymphoma Mother   . Dementia Mother   . Cancer Father     lung  . Colon cancer Maternal Uncle   . Esophageal cancer Neg Hx   . Pancreatic cancer Neg Hx   . Rectal cancer Neg Hx   . Stomach cancer Neg Hx     Allergies  Allergen Reactions  . Naproxen     REACTION: vomiting  . Ropinirole Nausea Only  . Vicodin [Hydrocodone-Acetaminophen] Nausea And Vomiting  . Propoxyphene N-Acetaminophen Nausea And Vomiting    Current Outpatient Prescriptions on File Prior to Visit  Medication Sig Dispense Refill  . Calcium Carbonate-Vitamin D (CALTRATE 600+D) 600-400 MG-UNIT per tablet Take 1 tablet by mouth 2 (two) times daily.    Marland Kitchen ketoconazole (NIZORAL) 2 % cream Apply 1 application topically at bedtime. X 7 days 30 g 0  . loratadine (CLARITIN) 10 MG tablet Take 1 tablet (10  mg total) by mouth daily. 30 tablet 2   No current facility-administered medications on file prior to visit.    BP 130/56 mmHg  Pulse 62  Temp(Src) 98.7 F (37.1 C) (Oral)  Resp 16  Ht 5' 7.75" (1.721 m)  Wt 227 lb 12.8 oz (103.329 kg)  BMI 34.89 kg/m2  SpO2 100%  LMP 02/28/2007    Objective:   Physical Exam  Constitutional: She appears well-developed and well-nourished.  Cardiovascular: Normal rate, regular rhythm and normal heart sounds.   No murmur heard. Pulmonary/Chest: Effort normal and breath sounds normal. No respiratory distress. She has no wheezes.  Psychiatric: She has a normal mood and affect. Her behavior is normal. Judgment and thought content normal.          Assessment & Plan:

## 2015-08-23 ENCOUNTER — Telehealth: Payer: Self-pay | Admitting: Family

## 2015-08-23 DIAGNOSIS — R911 Solitary pulmonary nodule: Secondary | ICD-10-CM

## 2015-08-23 NOTE — Telephone Encounter (Signed)
Left detailed message on pt's cell# re: below recommendation and to call if any questions. 

## 2015-08-23 NOTE — Telephone Encounter (Signed)
I reviewed her chart and would like her to repeat a CT of the chest to follow up on some lung nodules which were noted on her previous CT scan to make sure that they are stable on size.

## 2015-08-27 ENCOUNTER — Ambulatory Visit (HOSPITAL_BASED_OUTPATIENT_CLINIC_OR_DEPARTMENT_OTHER)
Admission: RE | Admit: 2015-08-27 | Discharge: 2015-08-27 | Disposition: A | Discharge status: Home / Self Care | Payer: 59 | Source: Ambulatory Visit | Attending: Family | Admitting: Family

## 2015-08-27 DIAGNOSIS — R918 Other nonspecific abnormal finding of lung field: Secondary | ICD-10-CM | POA: Diagnosis not present

## 2015-08-27 DIAGNOSIS — R911 Solitary pulmonary nodule: Secondary | ICD-10-CM | POA: Diagnosis not present

## 2015-10-20 LAB — HM DIABETES EYE EXAM

## 2015-10-28 ENCOUNTER — Other Ambulatory Visit: Payer: Self-pay | Admitting: Podiatry

## 2015-11-15 ENCOUNTER — Other Ambulatory Visit: Payer: Self-pay | Admitting: Family

## 2015-11-22 ENCOUNTER — Ambulatory Visit: Payer: 59 | Admitting: Family

## 2015-11-24 ENCOUNTER — Ambulatory Visit (INDEPENDENT_AMBULATORY_CARE_PROVIDER_SITE_OTHER): Payer: 59 | Admitting: Family

## 2015-11-24 ENCOUNTER — Encounter: Payer: Self-pay | Admitting: Family

## 2015-11-24 ENCOUNTER — Other Ambulatory Visit: Payer: Self-pay | Admitting: Family

## 2015-11-24 VITALS — BP 115/66 | HR 59 | Temp 98.2°F | Resp 16 | Ht 67.5 in | Wt 226.0 lb

## 2015-11-24 DIAGNOSIS — I10 Essential (primary) hypertension: Secondary | ICD-10-CM

## 2015-11-24 DIAGNOSIS — J309 Allergic rhinitis, unspecified: Secondary | ICD-10-CM

## 2015-11-24 DIAGNOSIS — M791 Myalgia: Secondary | ICD-10-CM

## 2015-11-24 DIAGNOSIS — M7918 Myalgia, other site: Secondary | ICD-10-CM

## 2015-11-24 MED ORDER — MELOXICAM 7.5 MG PO TABS: 7.5000 mg | ORAL_TABLET | Freq: Every day | ORAL | Status: DC

## 2015-11-24 MED ORDER — AZELASTINE HCL 0.1 % NA SOLN: 2.0000 | Freq: Every day | NASAL | Status: DC

## 2015-11-24 MED ORDER — CYCLOBENZAPRINE HCL 5 MG PO TABS: 5.0000 mg | ORAL_TABLET | Freq: Every evening | ORAL | Status: DC | PRN

## 2015-11-24 NOTE — Progress Notes (Signed)
Pre visit review using our clinic review tool, if applicable. No additional management support is needed unless otherwise documented below in the visit note. 

## 2015-11-24 NOTE — Progress Notes (Signed)
Subjective:    Patient ID: Nancy Deleon, female    DOB: 02/20/1956, 60 y.o.   MRN: [id number]  HPI  Back pain- reports that back pain is located in the lower back and began 3 weeks ago. Pain is intermittent, aching, non-radiating. Occasionally taking ibuprofen which does not help. Denies associated weakness/numbness/bowel or bladder incontinence. Pain is worsened by bending/twisting/lifting.  Declines PT due to time.   Cough- reports URI 1 month ago.  Resolved except now Nancy Deleon has a dry hacking cough at night mostly. Was taking mucinex which helped some. Does have some nasal congestion.  Denies gerd sxs.   Neck stiffness- reports that Nancy Deleon has some tension  In her neck. 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 Stretching improves the pain, coughing makes it worse.    Review of Systems   See HPI  Past Medical History  Diagnosis Date  . Hypertension   . Obesity   . Fatty liver 04/03/2014  . Allergy   . GERD (gastroesophageal reflux disease)      Social History   Social History  . Marital Status: Widowed    Spouse Name: N/A  . Number of Children: 2  . Years of Education: N/A   Occupational History  .     Social History Main Topics  . Smoking status: Former Smoker    Quit date: 04/18/1985  . Smokeless tobacco: Never Used  . Alcohol Use: 0.0 oz/week    0 Standard drinks or equivalent per week     Comment: occ  . Drug Use: No  . Sexual Activity:    Partners: Male    Birth Control/ Protection: Post-menopausal     Comment: 1st intercourse 60 yo-Fewer than 5 partners   Other Topics Concern  . Not on file   Social History Narrative   Regular exercise:  Walks 2-5 x weekly   Caffeine Use: no   Widowed   Nancy Deleon has a son [name]- he is 38 yrs old.   Foster parent.   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   Dog (Lab named Abby)             Past Surgical History  Procedure Laterality Date  . Tubal ligation     . Cesarean section  [id number]  . Breast biopsy  04/24/11    rigth breast  . Colonoscopy    . Upper gastrointestinal endoscopy      Family History  Problem Relation Age of Onset  . Lymphoma Mother   . Dementia Mother   . Cancer Father     lung  . Colon cancer Maternal Uncle   . Esophageal cancer Neg Hx   . Pancreatic cancer Neg Hx   . Rectal cancer Neg Hx   . Stomach cancer Neg Hx     Allergies  Allergen Reactions  . Naproxen     REACTION: vomiting  . Ropinirole Nausea Only  . Vicodin [Hydrocodone-Acetaminophen] Nausea And Vomiting  . Propoxyphene N-Acetaminophen Nausea And Vomiting    Current Outpatient Prescriptions on File Prior to Visit  Medication Sig Dispense Refill  . amLODipine (NORVASC) 10 MG tablet Take 1 tablet (10 mg total) by mouth daily. 90 tablet 0  . Calcium Carbonate-Vitamin D (CALTRATE 600+D) 600-400 MG-UNIT per tablet Take 1 tablet by mouth 2 (two) times daily.    Marland Kitchen loratadine (CLARITIN) 10 MG tablet Take 1 tablet (10 mg total) by  mouth daily. 30 tablet 2   No current facility-administered medications on file prior to visit.    BP 115/66 mmHg  Pulse 59  Temp(Src) 98.2 F (36.8 C) (Oral)  Resp 16  Ht 5' 7.5" (1.715 m)  Wt 226 lb (102.513 kg)  BMI 34.85 kg/m2  SpO2 100%  LMP 02/28/2007        Objective:   Physical Exam  Constitutional: Nancy Deleon is oriented to person, place, and time. Nancy Deleon appears well-developed and well-nourished.  HENT:  Right Ear: Tympanic membrane and ear canal normal.  Left Ear: Tympanic membrane and ear canal normal.  Mouth/Throat: No oropharyngeal exudate, posterior oropharyngeal edema or posterior oropharyngeal erythema.  Cardiovascular: Normal rate and regular rhythm.   Murmur heard. Pulmonary/Chest: Effort normal and breath sounds normal. No respiratory distress. Nancy Deleon has no wheezes.  Musculoskeletal: Nancy Deleon exhibits no edema.  Lymphadenopathy:    Nancy Deleon has no cervical adenopathy.  Neurological: Nancy Deleon is alert and oriented  to person, place, and time.  Psychiatric: Nancy Deleon has a normal mood and affect. Her behavior is normal. Judgment and thought content normal.          Assessment & Plan:  Musculoskeletal pain (back and neck) trial of meloxicam and prn flexeril. Nancy Deleon has tolerated meloxicam in the past.

## 2015-11-24 NOTE — Assessment & Plan Note (Signed)
>>  ASSESSMENT AND PLAN FOR ALLERGIC RHINITIS WRITTEN ON 11/24/2015 11:53 AM BY O'SULLIVAN, Lanson Randle, NP  Likely cause for cough. Trial of astelin  (has had epistaxis with nasal steroids).

## 2015-11-24 NOTE — Assessment & Plan Note (Signed)
BP is stable on current meds.   

## 2015-11-24 NOTE — Assessment & Plan Note (Signed)
Likely cause for cough. Trial of astelin (has had epistaxis with nasal steroids).

## 2015-11-24 NOTE — Patient Instructions (Signed)
Start meloxicam once daily as needed for back/neck pain. You may take flexeril at bedtime as needed. Begin astelin nose spray for allergies in addition to claritin. Call if cough worsens or if cough does not improve with the addition of astelin or if back/neck pain do not improve.

## 2015-11-26 ENCOUNTER — Telehealth: Payer: Self-pay | Admitting: Family

## 2015-11-26 DIAGNOSIS — M545 Low back pain: Secondary | ICD-10-CM

## 2015-11-26 NOTE — Telephone Encounter (Signed)
Caller name: Self   Can be reached: [phone] or [phone]   Reason for call: Patient called to inform that she has worked out her schedule so that she can go to see a Restaurant manager, fast food. Needs a referral

## 2015-12-24 ENCOUNTER — Other Ambulatory Visit: Payer: Self-pay | Admitting: Family

## 2016-01-17 ENCOUNTER — Telehealth: Payer: Self-pay | Admitting: Family

## 2016-01-17 NOTE — Telephone Encounter (Signed)
Was sent to me in error- will forward to PCP

## 2016-01-17 NOTE — Telephone Encounter (Signed)
Pt saw PCP for routine office visit in 07/2015, acute 11/2015. Pt is due for follow up of BP now.  Left detailed  Message on home/cell# to call and schedule f/u with Melissa at her earliest convenience. PCP will determine labs needed at appointment.

## 2016-01-17 NOTE — Telephone Encounter (Signed)
°  Relationship to patient: Self Can be reached: [phone]     Reason for call: Patient request call back in ref to if she needs to come in for blood work and a follow up. Was seen 4/19 and thinks she was to come back for F/U but not sure when.

## 2016-02-16 ENCOUNTER — Ambulatory Visit (INDEPENDENT_AMBULATORY_CARE_PROVIDER_SITE_OTHER): Payer: 59 | Admitting: Family

## 2016-02-16 ENCOUNTER — Encounter: Payer: Self-pay | Admitting: Family

## 2016-02-16 VITALS — BP 118/70 | HR 67 | Temp 98.3°F | Ht 67.0 in | Wt 218.2 lb

## 2016-02-16 DIAGNOSIS — R739 Hyperglycemia, unspecified: Secondary | ICD-10-CM

## 2016-02-16 DIAGNOSIS — Z0000 Encounter for general adult medical examination without abnormal findings: Secondary | ICD-10-CM

## 2016-02-16 DIAGNOSIS — E2839 Other primary ovarian failure: Secondary | ICD-10-CM | POA: Diagnosis not present

## 2016-02-16 LAB — HEPATIC FUNCTION PANEL
ALK PHOS: 56 U/L (ref 39–117)
ALT: 17 U/L (ref 0–35)
AST: 21 U/L (ref 0–37)
Albumin: 4.3 g/dL (ref 3.5–5.2)
BILIRUBIN DIRECT: 0.2 mg/dL (ref 0.0–0.3)
TOTAL PROTEIN: 7.3 g/dL (ref 6.0–8.3)
Total Bilirubin: 0.9 mg/dL (ref 0.2–1.2)

## 2016-02-16 LAB — URINALYSIS, ROUTINE W REFLEX MICROSCOPIC
Bilirubin Urine: NEGATIVE
Ketones, ur: NEGATIVE
Leukocytes, UA: NEGATIVE
Nitrite: NEGATIVE
SPECIFIC GRAVITY, URINE: 1.01 (ref 1.000–1.030)
Total Protein, Urine: NEGATIVE
Urine Glucose: NEGATIVE
Urobilinogen, UA: 1 (ref 0.0–1.0)
pH: 6.5 (ref 5.0–8.0)

## 2016-02-16 LAB — CBC WITH DIFFERENTIAL/PLATELET
BASOS ABS: 0 10*3/uL (ref 0.0–0.1)
BASOS PCT: 0.8 % (ref 0.0–3.0)
Eosinophils Absolute: 0.2 10*3/uL (ref 0.0–0.7)
Eosinophils Relative: 2.6 % (ref 0.0–5.0)
HCT: 37.5 % (ref 36.0–46.0)
Hemoglobin: 12.8 g/dL (ref 12.0–15.0)
LYMPHS PCT: 36.7 % (ref 12.0–46.0)
Lymphs Abs: 2.2 10*3/uL (ref 0.7–4.0)
MCHC: 34 g/dL (ref 30.0–36.0)
MCV: 74.8 fl — AB (ref 78.0–100.0)
MONOS PCT: 5.9 % (ref 3.0–12.0)
Monocytes Absolute: 0.4 10*3/uL (ref 0.1–1.0)
NEUTROS ABS: 3.3 10*3/uL (ref 1.4–7.7)
Neutrophils Relative %: 54 % (ref 43.0–77.0)
PLATELETS: 171 10*3/uL (ref 150.0–400.0)
RBC: 5.02 Mil/uL (ref 3.87–5.11)
RDW: 13.7 % (ref 11.5–15.5)
WBC: 6.1 10*3/uL (ref 4.0–10.5)

## 2016-02-16 LAB — BASIC METABOLIC PANEL
BUN: 11 mg/dL (ref 6–23)
CALCIUM: 10.1 mg/dL (ref 8.4–10.5)
CHLORIDE: 106 meq/L (ref 96–112)
CO2: 30 meq/L (ref 19–32)
Creatinine, Ser: 0.91 mg/dL (ref 0.40–1.20)
GFR: 81.29 mL/min (ref >60.00)
Glucose, Bld: 125 mg/dL — ABNORMAL HIGH (ref 70–99)
POTASSIUM: 3.7 meq/L (ref 3.5–5.1)
SODIUM: 143 meq/L (ref 135–145)

## 2016-02-16 LAB — LIPID PANEL
CHOL/HDL RATIO: 3
Cholesterol: 177 mg/dL (ref 0–200)
HDL: 56.7 mg/dL (ref >39.00)
LDL CALC: 108 mg/dL — AB (ref 0–99)
NONHDL: 120.2
Triglycerides: 60 mg/dL (ref 0.0–149.0)
VLDL: 12 mg/dL (ref 0.0–40.0)

## 2016-02-16 LAB — TSH: TSH: 0.66 u[IU]/mL (ref 0.35–4.50)

## 2016-02-16 NOTE — Addendum Note (Signed)
Addended by: [name] on: 02/16/2016 12:14 PM   Modules accepted: Miquel Dunn

## 2016-02-16 NOTE — Progress Notes (Signed)
Pre visit review using our clinic review tool, if applicable. No additional management support is needed unless otherwise documented below in the visit note. 

## 2016-02-16 NOTE — Patient Instructions (Signed)
Please complete lab work prior to leaving. Continue your work with healthy diet, exercise, weight loss.  Follow up in 6 months.

## 2016-02-16 NOTE — Progress Notes (Addendum)
Subjective:    Patient ID: Nancy Deleon, female    DOB: [birth year], 60 y.o.   MRN: [id number]  HPI  Patient presents today for complete physical.  Immunizations: up to date Diet: healthy Exercise: walking, squats Colonoscopy:2011, normal per patient Dexa: 2013, due Pap Smear: 03/04/14- lacked endocervical component, HPV negative. Will follow up with Dr. Phineas Real, GYN Mammogram: 02/09/15- due will do with GYN Dental: up to date Vision: up to date  Wt Readings from Last 3 Encounters:  02/16/16 218 lb 3.2 oz (98.975 kg)  11/24/15 226 lb (102.513 kg)  07/27/15 227 lb 12.8 oz (103.329 kg)    Review of Systems  Constitutional: Negative for unexpected weight change.  HENT: Negative for hearing loss and rhinorrhea.   Eyes: Negative for visual disturbance.  Respiratory: Negative for cough.   Cardiovascular: Negative for leg swelling.  Gastrointestinal: Negative for diarrhea and constipation.  Genitourinary: Negative for dysuria and frequency.  Musculoskeletal: Negative for myalgias and arthralgias.  Skin: Negative for rash.  Neurological: Negative for headaches.  Hematological: Negative for adenopathy.  Psychiatric/Behavioral:       Mother has dementia, they are moving her to snf, this is a source of stress   Past Medical History  Diagnosis Date  . Hypertension   . Obesity   . Fatty liver 04/03/2014  . Allergy   . GERD (gastroesophageal reflux disease)      Social History   Social History  . Marital Status: Widowed    Spouse Name: N/A  . Number of Children: 2  . Years of Education: N/A   Occupational History  .     Social History Main Topics  . Smoking status: Former Smoker    Quit date: 04/18/1985  . Smokeless tobacco: Never Used  . Alcohol Use: 0.0 oz/week    0 Standard drinks or equivalent per week     Comment: occ  . Drug Use: No  . Sexual Activity:    Partners: Male    Birth Control/ Protection: Post-menopausal     Comment: 1st intercourse 60  yo-Fewer than 5 partners   Other Topics Concern  . Not on file   Social History Narrative   Regular exercise:  Walks 2-5 x weekly   Caffeine Use: no   Widowed   She has a son [name]- he is 63 yrs old.   Foster parent.   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   Dog (Lab named Abby)             Past Surgical History  Procedure Laterality Date  . Tubal ligation    . Cesarean section  [id number]  . Breast biopsy  04/24/11    rigth breast  . Colonoscopy    . Upper gastrointestinal endoscopy      Family History  Problem Relation Age of Onset  . Lymphoma Mother   . Dementia Mother   . Cancer Father     lung  . Colon cancer Maternal Uncle   . Esophageal cancer Neg Hx   . Pancreatic cancer Neg Hx   . Rectal cancer Neg Hx   . Stomach cancer Neg Hx     Allergies  Allergen Reactions  . Naproxen     REACTION: vomiting  . Ropinirole Nausea Only  . Vicodin [Hydrocodone-Acetaminophen] Nausea And Vomiting  . Propoxyphene N-Acetaminophen Nausea And Vomiting    Current Outpatient Prescriptions on File Prior to Visit  Medication Sig Dispense Refill  . amLODipine (NORVASC) 10 MG tablet Take 1 tablet (10 mg total) by mouth daily. 90 tablet 0  . azelastine (ASTELIN) 0.1 % nasal spray Place 2 sprays into both nostrils daily. Use in each nostril as directed 30 mL 3  . Calcium Carbonate-Vitamin D (CALTRATE 600+D) 600-400 MG-UNIT per tablet Take 1 tablet by mouth 2 (two) times daily.    . cyclobenzaprine (FLEXERIL) 5 MG tablet Take 1 tablet (5 mg total) by mouth at bedtime as needed for muscle spasms. 30 tablet 0  . loratadine (CLARITIN) 10 MG tablet Take 1 tablet (10 mg total) by mouth daily. 30 tablet 2  . meloxicam (MOBIC) 7.5 MG tablet TAKE 1 TABLET(7.5 MG) BY MOUTH DAILY 30 tablet 0   No current facility-administered medications on file prior to visit.    BP 118/70 mmHg  Pulse 67  Temp(Src) 98.3 F (36.8 C) (Oral)  Ht  5\' 7"  (1.702 m)  Wt 218 lb 3.2 oz (98.975 kg)  BMI 34.17 kg/m2  SpO2 98%  LMP 02/28/2007       Objective:   Physical Exam Physical Exam  Constitutional: She is oriented to person, place, and time. She appears well-developed and well-nourished. No distress.  HENT:  Head: Normocephalic and atraumatic.  Right Ear: Tympanic membrane and ear canal normal.  Left Ear: Tympanic membrane and ear canal normal.  Mouth/Throat: Oropharynx is clear and moist.  Eyes: Pupils are equal, round, and reactive to light. No scleral icterus.  Neck: Normal range of motion. No thyromegaly present.  Cardiovascular: Normal rate and regular rhythm.   No murmur heard. Pulmonary/Chest: Effort normal and breath sounds normal. No respiratory distress. He has no wheezes. She has no rales. She exhibits no tenderness.  Abdominal: Soft. Bowel sounds are normal. He exhibits no distension and no mass. There is no tenderness. There is no rebound and no guarding.  Musculoskeletal: She exhibits no edema.  Lymphadenopathy:    She has no cervical adenopathy.  Neurological: She is alert and oriented to person, place, and time. She has normal patella reflexes. She exhibits normal muscle tone. Coordination normal.  Skin: Skin is warm and dry.  Psychiatric: She has a normal mood and affect. Her behavior is normal. Judgment and thought content normal.  Breast/pelvic: deferred          Assessment & Plan:   Preventative care- refer for dexa. She will follow back up with GYN for pap and mammo. Encouraged her to continue her healthy diet, exercise and weight loss. Obtain routine lab work.       Assessment & Plan:  EKG tracing is personally reviewed.  EKG notes NSR.  No acute changes.

## 2016-02-17 ENCOUNTER — Other Ambulatory Visit: Payer: Self-pay | Admitting: Family

## 2016-02-17 DIAGNOSIS — Z1231 Encounter for screening mammogram for malignant neoplasm of breast: Secondary | ICD-10-CM

## 2016-02-19 ENCOUNTER — Encounter: Payer: Self-pay | Admitting: Family

## 2016-02-21 ENCOUNTER — Other Ambulatory Visit (HOSPITAL_BASED_OUTPATIENT_CLINIC_OR_DEPARTMENT_OTHER): Payer: 59

## 2016-02-21 ENCOUNTER — Other Ambulatory Visit: Payer: Self-pay | Admitting: Family

## 2016-02-21 DIAGNOSIS — Z1231 Encounter for screening mammogram for malignant neoplasm of breast: Secondary | ICD-10-CM

## 2016-02-21 NOTE — Telephone Encounter (Signed)
Rx sent to the pharmacy by e-script.//AB/CMA 

## 2016-02-22 ENCOUNTER — Ambulatory Visit (HOSPITAL_BASED_OUTPATIENT_CLINIC_OR_DEPARTMENT_OTHER)
Admission: RE | Admit: 2016-02-22 | Discharge: 2016-02-22 | Disposition: A | Discharge status: Home / Self Care | Payer: 59 | Source: Ambulatory Visit | Attending: Family | Admitting: Family

## 2016-02-22 DIAGNOSIS — E2839 Other primary ovarian failure: Secondary | ICD-10-CM

## 2016-02-22 DIAGNOSIS — Z780 Asymptomatic menopausal state: Secondary | ICD-10-CM | POA: Insufficient documentation

## 2016-02-22 DIAGNOSIS — Z1231 Encounter for screening mammogram for malignant neoplasm of breast: Secondary | ICD-10-CM | POA: Diagnosis not present

## 2016-02-23 ENCOUNTER — Encounter: Payer: Self-pay | Admitting: Family

## 2016-02-25 ENCOUNTER — Ambulatory Visit: Payer: 59

## 2016-04-07 ENCOUNTER — Ambulatory Visit (INDEPENDENT_AMBULATORY_CARE_PROVIDER_SITE_OTHER): Payer: 59 | Admitting: Gynecology

## 2016-04-07 ENCOUNTER — Encounter: Payer: Self-pay | Admitting: Gynecology

## 2016-04-07 VITALS — BP 122/78 | Ht 67.0 in | Wt 218.0 lb

## 2016-04-07 DIAGNOSIS — N952 Postmenopausal atrophic vaginitis: Secondary | ICD-10-CM | POA: Diagnosis not present

## 2016-04-07 DIAGNOSIS — Z01419 Encounter for gynecological examination (general) (routine) without abnormal findings: Secondary | ICD-10-CM

## 2016-04-07 NOTE — Progress Notes (Signed)
    Nancy Deleon 06-Nov-1955 [id number]        61 y.o.  G1P1  for annual exam.  Doing well without complaints  Past medical history,surgical history, problem list, medications, allergies, family history and social history were all reviewed and documented as reviewed in the EPIC chart.  ROS:  Performed with pertinent positives and negatives included in the history, assessment and plan.   Additional significant findings :  None   Exam: [name] assistant Vitals:   04/07/16 1408  BP: 122/78  Weight: 218 lb (98.9 kg)  Height: 5\' 7"  (1.702 m)   Body mass index is 34.14 kg/m.  General appearance:  Normal affect, orientation and appearance. Skin: Grossly normal HEENT: Without gross lesions.  No cervical or supraclavicular adenopathy. Thyroid normal.  Lungs:  Clear without wheezing, rales or rhonchi Cardiac: RR, without RMG Abdominal:  Soft, nontender, without masses, guarding, rebound, organomegaly or hernia Breasts:  Examined lying and sitting without masses, retractions, discharge or axillary adenopathy. Pelvic:  Ext/BUS/Vagina normal with atrophic changes  Cervix with atrophic changes  Uterus anteverted, normal size, shape and contour, midline and mobile nontender   Adnexa without masses or tenderness    Anus and perineum normal   Rectovaginal normal sphincter tone without palpated masses or tenderness.    Assessment/Plan:  60 y.o. G1P1 female for annual exam.   1. Postmenopausal/atrophic genital changes. Doing well without significant hot flushes, night sweats, vaginal dryness or any vaginal bleeding. Continue monitor report any issues or vaginal bleeding. 2. Mammography 02/2016. Continue with annual mammography when due. SBE monthly reviewed. 3. DEXA 2017 normal. Recommend repeat at age 54. Increased calcium vitamin D. 4. Colonoscopy 2011 with reported repeat interval 10 years. 5. Pap smear/HPV 02/2014. No Pap smear done today. No history of significant abnormal Pap  smears. Plan repeat Pap smear at 5 year interval per current screening guidelines 6. Health maintenance. No routine lab work done as patient reports is done elsewhere. Follow up 1 year, sooner as needed.   Anastasio Auerbach MD, 2:26 PM 04/07/2016

## 2016-04-07 NOTE — Patient Instructions (Signed)

## 2016-05-16 ENCOUNTER — Other Ambulatory Visit: Payer: Self-pay | Admitting: Family

## 2016-07-26 ENCOUNTER — Telehealth: Payer: Self-pay | Admitting: *Deleted

## 2016-07-26 MED ORDER — AMLODIPINE BESYLATE 10 MG PO TABS
10.0000 mg | ORAL_TABLET | Freq: Every day | ORAL | 1 refills | Status: DC
Start: 2016-07-26 — End: 2016-07-28

## 2016-07-26 NOTE — Telephone Encounter (Signed)
Received fax from [facility] requesting refill of Norvasc 10mg . Refill sent.

## 2016-07-28 MED ORDER — AMLODIPINE BESYLATE 10 MG PO TABS
10.0000 mg | ORAL_TABLET | Freq: Every day | ORAL | 1 refills | Status: DC
Start: 2016-07-28 — End: 2016-08-11

## 2016-07-28 NOTE — Addendum Note (Signed)
Addended by: Kelle Darting A on: 07/28/2016 04:00 PM   Modules accepted: Orders

## 2016-07-28 NOTE — Telephone Encounter (Signed)
Below refill was to have gone to [facility] and went to [facility] in error. Rx re-sent to [facility].

## 2016-08-11 ENCOUNTER — Telehealth: Payer: Self-pay | Admitting: *Deleted

## 2016-08-11 MED ORDER — AMLODIPINE BESYLATE 10 MG PO TABS: 10.0000 mg | ORAL_TABLET | Freq: Every day | ORAL | 1 refills | Status: DC

## 2016-08-11 NOTE — Telephone Encounter (Signed)
Received fax from [facility] requesting Rx for amlodipine. Verified with pt that mail order pharmacy is [facility] and will no longer use [facility]. Refill sent.

## 2016-08-18 ENCOUNTER — Ambulatory Visit: Payer: 59 | Admitting: Family

## 2016-08-25 ENCOUNTER — Encounter: Payer: Self-pay | Admitting: Family

## 2016-08-25 ENCOUNTER — Ambulatory Visit (INDEPENDENT_AMBULATORY_CARE_PROVIDER_SITE_OTHER): Admitting: Family

## 2016-08-25 ENCOUNTER — Ambulatory Visit (HOSPITAL_BASED_OUTPATIENT_CLINIC_OR_DEPARTMENT_OTHER)
Admission: RE | Admit: 2016-08-25 | Discharge: 2016-08-25 | Disposition: A | Discharge status: Home / Self Care | Source: Ambulatory Visit | Attending: Family | Admitting: Family

## 2016-08-25 VITALS — BP 122/54 | HR 62 | Temp 97.7°F | Resp 16 | Ht 67.0 in | Wt 227.8 lb

## 2016-08-25 DIAGNOSIS — R200 Anesthesia of skin: Secondary | ICD-10-CM

## 2016-08-25 DIAGNOSIS — R739 Hyperglycemia, unspecified: Secondary | ICD-10-CM

## 2016-08-25 DIAGNOSIS — I10 Essential (primary) hypertension: Secondary | ICD-10-CM | POA: Diagnosis not present

## 2016-08-25 LAB — BASIC METABOLIC PANEL
BUN: 16 mg/dL (ref 7–25)
CALCIUM: 10 mg/dL (ref 8.6–10.4)
CO2: 28 mmol/L (ref 20–31)
CREATININE: 0.81 mg/dL (ref 0.50–1.05)
Chloride: 106 mmol/L (ref 98–110)
GLUCOSE: 95 mg/dL (ref 65–99)
Potassium: 4 mmol/L (ref 3.5–5.3)
Sodium: 141 mmol/L (ref 135–146)

## 2016-08-25 LAB — HEMOGLOBIN A1C
Hgb A1c MFr Bld: 5.8 % — ABNORMAL HIGH (ref <5.7)
Mean Plasma Glucose: 120 mg/dL

## 2016-08-25 NOTE — Progress Notes (Signed)
Subjective:    Patient ID: Nancy Deleon, female    DOB: March 20, 1956, 61 y.o.   MRN: [id number]  HPI  Nancy Deleon is a 61 yr old female who presents today for follow up.   1) HTN- Patient is currently maintained on amlodipine 10mg  once daily.  BP Readings from Last 3 Encounters:  08/25/16 (!) 122/54  04/07/16 122/78  02/16/16 118/70   2) Facial numbness- pt reports 2 episodes of brief (15-20 second) numbness noted on the left side of her face.  Reports that last Monday she developed numbness on the left cheek.  Did not have slurred speech of facial drooping.  This happened on Monday and again on Tuesday.      Review of Systems Denies CP/SOB or swelling  Past Medical History:  Diagnosis Date  . Allergy   . Fatty liver 04/03/2014  . GERD (gastroesophageal reflux disease)   . Hypertension   . Obesity      Social History   Social History  . Marital status: Widowed    Spouse name: N/A  . Number of children: 2  . Years of education: N/A   Occupational History  .  [employer]   Social History Main Topics  . Smoking status: Former Smoker    Quit date: 04/18/1985  . Smokeless tobacco: Never Used  . Alcohol use 0.0 oz/week     Comment: occ  . Drug use: No  . Sexual activity: Not Currently    Partners: Male    Birth control/ protection: Post-menopausal     Comment: 1st intercourse 61 yo-Fewer than 5 partners   Other Topics Concern  . Not on file   Social History Narrative   Regular exercise:  Walks 2-5 x weekly   Caffeine Use: no   Widowed   She has a son [name]- he is 44 yrs old.   Foster parent.   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   Dog (Lab named Abby)             Past Surgical History:  Procedure Laterality Date  . BREAST BIOPSY  04/24/11   rigth breast  . CESAREAN SECTION  [id number]  . COLONOSCOPY    . TUBAL LIGATION    . UPPER GASTROINTESTINAL ENDOSCOPY      Family History    Problem Relation Age of Onset  . Lymphoma Mother   . Dementia Mother   . Cancer Father     lung  . Colon cancer Maternal Uncle   . Esophageal cancer Neg Hx   . Pancreatic cancer Neg Hx   . Rectal cancer Neg Hx   . Stomach cancer Neg Hx     Allergies  Allergen Reactions  . Naproxen     REACTION: vomiting  . Ropinirole Nausea Only  . Vicodin [Hydrocodone-Acetaminophen] Nausea And Vomiting  . Propoxyphene N-Acetaminophen Nausea And Vomiting    Current Outpatient Prescriptions on File Prior to Visit  Medication Sig Dispense Refill  . amLODipine (NORVASC) 10 MG tablet Take 1 tablet (10 mg total) by mouth daily. 90 tablet 1  . Calcium Carbonate-Vitamin D (CALTRATE 600+D) 600-400 MG-UNIT per tablet Take 1 tablet by mouth 2 (two) times daily.    Marland Kitchen loratadine (CLARITIN) 10 MG tablet Take 1 tablet (10 mg total) by mouth daily. 30 tablet 2   No current facility-administered medications on file prior to visit.  BP (!) 122/54 (BP Location: Right Arm, Cuff Size: Large)   Pulse 62   Temp 97.7 F (36.5 C) (Oral)   Resp 16   Ht 5\' 7"  (1.702 m)   Wt 227 lb 12.8 oz (103.3 kg)   LMP 02/28/2007   SpO2 99% Comment: room air  BMI 35.68 kg/m       Objective:   Physical Exam  Constitutional: She is oriented to person, place, and time. She appears well-developed and well-nourished.  HENT:  Head: Normocephalic and atraumatic.  Eyes:  Bilateral pupils are dilated (s/p dilation today at eye doctor).   Cardiovascular: Normal rate, regular rhythm and normal heart sounds.   No murmur heard. Pulses:      Carotid pulses are 2+ on the right side, and 2+ on the left side. No carotid bruits noted  Pulmonary/Chest: Effort normal and breath sounds normal. No respiratory distress. She has no wheezes.  Musculoskeletal: She exhibits no edema.  Lymphadenopathy:    She has no cervical adenopathy.  Neurological: She is alert and oriented to person, place, and time. No cranial nerve deficit. She  exhibits normal muscle tone. Coordination normal.  + facial symmetry noted Tongue midline  Skin: Skin is warm and dry.  Psychiatric: She has a normal mood and affect. Her behavior is normal. Judgment and thought content normal.          Assessment & Plan:  Facial numbness (transient)- resolved. ? TIA, ? Mild trigeminal neuralgia, ? Anxiety/stress related.  Will obtain a CT head to rule out CVA. Pt is advised to go to the ER  if she develops recurrent facial numbness that last more than a few seconds.

## 2016-08-25 NOTE — Progress Notes (Signed)
Pre visit review using our clinic review tool, if applicable. No additional management support is needed unless otherwise documented below in the visit note. 

## 2016-08-25 NOTE — Assessment & Plan Note (Signed)
BP stable on current medication, continue same, obtain follow up bmet.

## 2016-08-25 NOTE — Patient Instructions (Signed)
Please complete CT head on the first floor. Go to ER if you develop recurrent facial numbness that last more than a few seconds.

## 2016-08-25 NOTE — Addendum Note (Signed)
Addended by: Peggyann Shoals on: 08/25/2016 02:34 PM   Modules accepted: Orders

## 2016-08-25 NOTE — Assessment & Plan Note (Signed)
Obtain A1C. 

## 2016-08-27 ENCOUNTER — Encounter: Payer: Self-pay | Admitting: Family

## 2017-03-23 ENCOUNTER — Other Ambulatory Visit: Payer: Self-pay | Admitting: Family

## 2017-03-23 DIAGNOSIS — Z1231 Encounter for screening mammogram for malignant neoplasm of breast: Secondary | ICD-10-CM

## 2017-03-28 ENCOUNTER — Ambulatory Visit
Admission: RE | Admit: 2017-03-28 | Discharge: 2017-03-28 | Disposition: A | Discharge status: Home / Self Care | Source: Ambulatory Visit | Attending: Family | Admitting: Family

## 2017-03-28 DIAGNOSIS — Z1231 Encounter for screening mammogram for malignant neoplasm of breast: Secondary | ICD-10-CM

## 2017-03-30 ENCOUNTER — Other Ambulatory Visit: Payer: Self-pay | Admitting: Family

## 2017-03-30 DIAGNOSIS — R928 Other abnormal and inconclusive findings on diagnostic imaging of breast: Secondary | ICD-10-CM

## 2017-04-03 ENCOUNTER — Ambulatory Visit
Admission: RE | Admit: 2017-04-03 | Discharge: 2017-04-03 | Disposition: A | Discharge status: Home / Self Care | Source: Ambulatory Visit | Attending: Family | Admitting: Family

## 2017-04-03 ENCOUNTER — Telehealth: Payer: Self-pay

## 2017-04-03 ENCOUNTER — Other Ambulatory Visit: Payer: Self-pay | Admitting: Family

## 2017-04-03 DIAGNOSIS — R928 Other abnormal and inconclusive findings on diagnostic imaging of breast: Secondary | ICD-10-CM

## 2017-04-03 NOTE — Telephone Encounter (Signed)
-----   Message from [name], NP sent at 03/29/2017  9:56 AM EDT ----- Please let pt know that the radiologist would like her to complete some additional breast images for further evaluation. Let me know if she has not been contacted by them about a follow up appointment in 1 week.

## 2017-04-03 NOTE — Telephone Encounter (Signed)
Called to inform Pt about the radiologist requesting additional imaging. Left a voicemail asking Pt to give [place] a call back at this office if she has not heard from them within a weeks time.

## 2017-04-23 ENCOUNTER — Ambulatory Visit: Payer: Self-pay | Admitting: Surgery

## 2017-04-23 DIAGNOSIS — D242 Benign neoplasm of left breast: Secondary | ICD-10-CM

## 2017-04-24 ENCOUNTER — Other Ambulatory Visit: Payer: Self-pay | Admitting: Surgery

## 2017-04-24 DIAGNOSIS — D242 Benign neoplasm of left breast: Secondary | ICD-10-CM

## 2017-04-25 ENCOUNTER — Encounter (HOSPITAL_BASED_OUTPATIENT_CLINIC_OR_DEPARTMENT_OTHER): Payer: Self-pay | Admitting: *Deleted

## 2017-04-26 ENCOUNTER — Other Ambulatory Visit: Payer: Self-pay

## 2017-04-26 ENCOUNTER — Encounter (HOSPITAL_BASED_OUTPATIENT_CLINIC_OR_DEPARTMENT_OTHER)
Admission: RE | Admit: 2017-04-26 | Discharge: 2017-04-26 | Disposition: A | Discharge status: Home / Self Care | Source: Ambulatory Visit | Attending: Surgery | Admitting: Surgery

## 2017-04-26 DIAGNOSIS — I10 Essential (primary) hypertension: Secondary | ICD-10-CM | POA: Diagnosis not present

## 2017-04-26 DIAGNOSIS — D242 Benign neoplasm of left breast: Secondary | ICD-10-CM | POA: Diagnosis not present

## 2017-04-26 DIAGNOSIS — Z01818 Encounter for other preprocedural examination: Secondary | ICD-10-CM | POA: Insufficient documentation

## 2017-04-26 NOTE — Progress Notes (Signed)
Pt stopped by [facility] for EKG and to obtain Ensure Presurgery Drink. Gave pt drink with label attached instructing her to refrigerate drink and to drink all of the liquid by 0600 on DOS. Pt verbalized understanding.

## 2017-04-30 ENCOUNTER — Ambulatory Visit
Admission: RE | Admit: 2017-04-30 | Discharge: 2017-04-30 | Disposition: A | Discharge status: Home / Self Care | Source: Ambulatory Visit | Attending: Surgery | Admitting: Surgery

## 2017-04-30 DIAGNOSIS — D242 Benign neoplasm of left breast: Secondary | ICD-10-CM

## 2017-05-01 ENCOUNTER — Encounter (HOSPITAL_BASED_OUTPATIENT_CLINIC_OR_DEPARTMENT_OTHER): Payer: Self-pay | Admitting: *Deleted

## 2017-05-01 ENCOUNTER — Ambulatory Visit (HOSPITAL_BASED_OUTPATIENT_CLINIC_OR_DEPARTMENT_OTHER): Admitting: Anesthesiology

## 2017-05-01 ENCOUNTER — Ambulatory Visit (HOSPITAL_BASED_OUTPATIENT_CLINIC_OR_DEPARTMENT_OTHER)
Admission: RE | Admit: 2017-05-01 | Discharge: 2017-05-01 | Disposition: A | Discharge status: Home / Self Care | Source: Ambulatory Visit | Attending: Surgery | Admitting: Surgery

## 2017-05-01 ENCOUNTER — Ambulatory Visit
Admission: RE | Admit: 2017-05-01 | Discharge: 2017-05-01 | Disposition: A | Discharge status: Home / Self Care | Source: Ambulatory Visit | Attending: Surgery | Admitting: Surgery

## 2017-05-01 ENCOUNTER — Encounter (HOSPITAL_BASED_OUTPATIENT_CLINIC_OR_DEPARTMENT_OTHER)
Admission: RE | Disposition: A | Discharge status: Home / Self Care | Payer: Self-pay | Source: Ambulatory Visit | Attending: Surgery

## 2017-05-01 DIAGNOSIS — R51 Headache: Secondary | ICD-10-CM | POA: Insufficient documentation

## 2017-05-01 DIAGNOSIS — D242 Benign neoplasm of left breast: Secondary | ICD-10-CM | POA: Insufficient documentation

## 2017-05-01 DIAGNOSIS — Z888 Allergy status to other drugs, medicaments and biological substances status: Secondary | ICD-10-CM | POA: Insufficient documentation

## 2017-05-01 DIAGNOSIS — Z6835 Body mass index (BMI) 35.0-35.9, adult: Secondary | ICD-10-CM | POA: Diagnosis not present

## 2017-05-01 DIAGNOSIS — Z87891 Personal history of nicotine dependence: Secondary | ICD-10-CM | POA: Insufficient documentation

## 2017-05-01 DIAGNOSIS — K219 Gastro-esophageal reflux disease without esophagitis: Secondary | ICD-10-CM | POA: Insufficient documentation

## 2017-05-01 DIAGNOSIS — I10 Essential (primary) hypertension: Secondary | ICD-10-CM | POA: Diagnosis not present

## 2017-05-01 DIAGNOSIS — E669 Obesity, unspecified: Secondary | ICD-10-CM | POA: Diagnosis not present

## 2017-05-01 DIAGNOSIS — I441 Atrioventricular block, second degree: Secondary | ICD-10-CM | POA: Insufficient documentation

## 2017-05-01 DIAGNOSIS — Z885 Allergy status to narcotic agent status: Secondary | ICD-10-CM | POA: Diagnosis not present

## 2017-05-01 DIAGNOSIS — N6022 Fibroadenosis of left breast: Secondary | ICD-10-CM | POA: Diagnosis not present

## 2017-05-01 DIAGNOSIS — Z8249 Family history of ischemic heart disease and other diseases of the circulatory system: Secondary | ICD-10-CM | POA: Diagnosis not present

## 2017-05-01 DIAGNOSIS — Z79899 Other long term (current) drug therapy: Secondary | ICD-10-CM | POA: Insufficient documentation

## 2017-05-01 HISTORY — PX: BREAST LUMPECTOMY WITH RADIOACTIVE SEED LOCALIZATION: SHX6424

## 2017-05-01 HISTORY — DX: Unspecified lump in the left breast, unspecified quadrant: N63.20

## 2017-05-01 SURGERY — BREAST LUMPECTOMY WITH RADIOACTIVE SEED LOCALIZATION
Anesthesia: General | Site: Breast | Laterality: Left

## 2017-05-01 MED ORDER — FENTANYL CITRATE (PF) 100 MCG/2ML IJ SOLN
INTRAMUSCULAR | Status: AC
  Filled 2017-05-01: qty 2

## 2017-05-01 MED ORDER — ACETAMINOPHEN 500 MG PO TABS
1000.0000 mg | ORAL_TABLET | ORAL | Status: AC
  Administered 2017-05-01: 1000 mg via ORAL

## 2017-05-01 MED ORDER — GABAPENTIN 300 MG PO CAPS
300.0000 mg | ORAL_CAPSULE | ORAL | Status: AC
  Administered 2017-05-01: 300 mg via ORAL

## 2017-05-01 MED ORDER — SCOPOLAMINE 1 MG/3DAYS TD PT72: 1.0000 | MEDICATED_PATCH | Freq: Once | TRANSDERMAL | Status: DC | PRN

## 2017-05-01 MED ORDER — ACETAMINOPHEN 500 MG PO TABS
ORAL_TABLET | ORAL | Status: AC
  Filled 2017-05-01: qty 2

## 2017-05-01 MED ORDER — PROPOFOL 10 MG/ML IV BOLUS
INTRAVENOUS | Status: DC | PRN
  Administered 2017-05-01: 150 mg via INTRAVENOUS

## 2017-05-01 MED ORDER — LACTATED RINGERS IV SOLN
INTRAVENOUS | Status: DC
  Administered 2017-05-01: 08:00:00 via INTRAVENOUS

## 2017-05-01 MED ORDER — BUPIVACAINE-EPINEPHRINE 0.25% -1:200000 IJ SOLN
INTRAMUSCULAR | Status: DC | PRN
  Administered 2017-05-01: 10 mL

## 2017-05-01 MED ORDER — FENTANYL CITRATE (PF) 100 MCG/2ML IJ SOLN
50.0000 ug | INTRAMUSCULAR | Status: AC | PRN
  Administered 2017-05-01: 25 ug via INTRAVENOUS
  Administered 2017-05-01: 50 ug via INTRAVENOUS
  Administered 2017-05-01: 25 ug via INTRAVENOUS

## 2017-05-01 MED ORDER — MIDAZOLAM HCL 2 MG/2ML IJ SOLN
1.0000 mg | INTRAMUSCULAR | Status: DC | PRN
  Administered 2017-05-01: 2 mg via INTRAVENOUS

## 2017-05-01 MED ORDER — ONDANSETRON HCL 4 MG/2ML IJ SOLN
INTRAMUSCULAR | Status: DC | PRN
  Administered 2017-05-01: 4 mg via INTRAVENOUS

## 2017-05-01 MED ORDER — GABAPENTIN 300 MG PO CAPS
ORAL_CAPSULE | ORAL | Status: AC
  Filled 2017-05-01: qty 1

## 2017-05-01 MED ORDER — LIDOCAINE HCL (CARDIAC) 20 MG/ML IV SOLN
INTRAVENOUS | Status: DC | PRN
  Administered 2017-05-01: 100 mg via INTRAVENOUS

## 2017-05-01 MED ORDER — CHLORHEXIDINE GLUCONATE CLOTH 2 % EX PADS: 6.0000 | MEDICATED_PAD | Freq: Once | CUTANEOUS | Status: DC

## 2017-05-01 MED ORDER — MIDAZOLAM HCL 2 MG/2ML IJ SOLN
INTRAMUSCULAR | Status: AC
  Filled 2017-05-01: qty 2

## 2017-05-01 MED ORDER — PROMETHAZINE HCL 25 MG/ML IJ SOLN: 6.2500 mg | INTRAMUSCULAR | Status: DC | PRN

## 2017-05-01 MED ORDER — CEFAZOLIN SODIUM-DEXTROSE 2-4 GM/100ML-% IV SOLN
INTRAVENOUS | Status: AC
  Filled 2017-05-01: qty 100

## 2017-05-01 MED ORDER — TRAMADOL HCL 50 MG PO TABS: 50.0000 mg | ORAL_TABLET | Freq: Four times a day (QID) | ORAL | 0 refills | Status: DC | PRN

## 2017-05-01 MED ORDER — FENTANYL CITRATE (PF) 100 MCG/2ML IJ SOLN: 25.0000 ug | INTRAMUSCULAR | Status: DC | PRN

## 2017-05-01 MED ORDER — CEFAZOLIN SODIUM-DEXTROSE 2-4 GM/100ML-% IV SOLN
2.0000 g | INTRAVENOUS | Status: AC
  Administered 2017-05-01: 2 g via INTRAVENOUS

## 2017-05-01 MED ORDER — DEXAMETHASONE SODIUM PHOSPHATE 4 MG/ML IJ SOLN
INTRAMUSCULAR | Status: DC | PRN
  Administered 2017-05-01: 10 mg via INTRAVENOUS

## 2017-05-01 SURGICAL SUPPLY — 55 items
APL SKNCLS STERI-STRIP NONHPOA (GAUZE/BANDAGES/DRESSINGS) ×1
APPLIER CLIP 9.375 MED OPEN (MISCELLANEOUS)
APR CLP MED 9.3 20 MLT OPN (MISCELLANEOUS)
BENZOIN TINCTURE PRP APPL 2/3 (GAUZE/BANDAGES/DRESSINGS) ×3 IMPLANT
BLADE HEX COATED 2.75 (ELECTRODE) ×3 IMPLANT
BLADE SURG 15 STRL LF DISP TIS (BLADE) ×1 IMPLANT
BLADE SURG 15 STRL SS (BLADE) ×3
CANISTER SUC SOCK COL 7IN (MISCELLANEOUS) IMPLANT
CANISTER SUCT 1200ML W/VALVE (MISCELLANEOUS) ×3 IMPLANT
CHLORAPREP W/TINT 26ML (MISCELLANEOUS) ×3 IMPLANT
CLIP APPLIE 9.375 MED OPEN (MISCELLANEOUS) ×1 IMPLANT
CLOSURE WOUND 1/2 X4 (GAUZE/BANDAGES/DRESSINGS) ×1
COVER BACK TABLE 60X90IN (DRAPES) ×3 IMPLANT
COVER MAYO STAND STRL (DRAPES) ×3 IMPLANT
COVER PROBE W GEL 5X96 (DRAPES) ×3 IMPLANT
DECANTER SPIKE VIAL GLASS SM (MISCELLANEOUS) IMPLANT
DEVICE DUBIN W/COMP PLATE 8390 (MISCELLANEOUS) ×3 IMPLANT
DRAPE LAPAROTOMY 100X72 PEDS (DRAPES) ×3 IMPLANT
DRAPE UTILITY XL STRL (DRAPES) ×1 IMPLANT
DRSG TEGADERM 4X4.75 (GAUZE/BANDAGES/DRESSINGS) ×3 IMPLANT
ELECT REM PT RETURN 9FT ADLT (ELECTROSURGICAL) ×3
ELECTRODE REM PT RTRN 9FT ADLT (ELECTROSURGICAL) ×1 IMPLANT
GAUZE SPONGE 4X4 12PLY STRL LF (GAUZE/BANDAGES/DRESSINGS) IMPLANT
GLOVE BIO SURGEON STRL SZ 6.5 (GLOVE) ×2 IMPLANT
GLOVE BIO SURGEON STRL SZ7 (GLOVE) ×3 IMPLANT
GLOVE BIO SURGEONS STRL SZ 6.5 (GLOVE) ×2
GLOVE BIOGEL PI IND STRL 7.5 (GLOVE) ×1 IMPLANT
GLOVE BIOGEL PI INDICATOR 7.5 (GLOVE) ×2
GOWN STRL REUS W/ TWL LRG LVL3 (GOWN DISPOSABLE) ×2 IMPLANT
GOWN STRL REUS W/TWL LRG LVL3 (GOWN DISPOSABLE) ×6
ILLUMINATOR WAVEGUIDE N/F (MISCELLANEOUS) IMPLANT
KIT MARKER MARGIN INK (KITS) ×3 IMPLANT
LIGHT WAVEGUIDE WIDE FLAT (MISCELLANEOUS) IMPLANT
NDL HYPO 25X1 1.5 SAFETY (NEEDLE) ×1 IMPLANT
NEEDLE HYPO 25X1 1.5 SAFETY (NEEDLE) ×3 IMPLANT
NS IRRIG 1000ML POUR BTL (IV SOLUTION) ×3 IMPLANT
PACK BASIN DAY SURGERY FS (CUSTOM PROCEDURE TRAY) ×3 IMPLANT
PENCIL BUTTON HOLSTER BLD 10FT (ELECTRODE) ×3 IMPLANT
SLEEVE SCD COMPRESS KNEE MED (MISCELLANEOUS) ×3 IMPLANT
SPONGE GAUZE 2X2 8PLY STER LF (GAUZE/BANDAGES/DRESSINGS)
SPONGE GAUZE 2X2 8PLY STRL LF (GAUZE/BANDAGES/DRESSINGS) IMPLANT
SPONGE LAP 18X18 X RAY DECT (DISPOSABLE) IMPLANT
SPONGE LAP 4X18 X RAY DECT (DISPOSABLE) ×3 IMPLANT
STRIP CLOSURE SKIN 1/2X4 (GAUZE/BANDAGES/DRESSINGS) ×2 IMPLANT
SUT MON AB 4-0 PC3 18 (SUTURE) ×3 IMPLANT
SUT SILK 2 0 SH (SUTURE) IMPLANT
SUT VIC AB 3-0 SH 27 (SUTURE) ×3
SUT VIC AB 3-0 SH 27X BRD (SUTURE) ×1 IMPLANT
SYR BULB 3OZ (MISCELLANEOUS) IMPLANT
SYR CONTROL 10ML LL (SYRINGE) ×3 IMPLANT
TOWEL OR 17X24 6PK STRL BLUE (TOWEL DISPOSABLE) ×3 IMPLANT
TOWEL OR NON WOVEN STRL DISP B (DISPOSABLE) ×3 IMPLANT
TUBE CONNECTING 20'X1/4 (TUBING) ×1
TUBE CONNECTING 20X1/4 (TUBING) ×2 IMPLANT
YANKAUER SUCT BULB TIP NO VENT (SUCTIONS) ×3 IMPLANT

## 2017-05-01 NOTE — Anesthesia Preprocedure Evaluation (Signed)
Anesthesia Evaluation  Patient identified by MRN, date of birth, ID band Patient awake    Reviewed: Allergy & Precautions, NPO status , Patient's Chart, lab work & pertinent test results  Airway Mallampati: II  TM Distance: >3 FB Neck ROM: Full    Dental  (+) Teeth Intact, Dental Advisory Given   Pulmonary former smoker,    Pulmonary exam normal breath sounds clear to auscultation       Cardiovascular hypertension, Pt. on medications Normal cardiovascular exam+ dysrhythmias (Mobitz 1)  Rhythm:Regular Rate:Normal     Neuro/Psych  Headaches,    GI/Hepatic Neg liver ROS, GERD  ,  Endo/Other  Obesity   Renal/GU negative Renal ROS     Musculoskeletal negative musculoskeletal ROS (+)   Abdominal   Peds  Hematology negative hematology ROS (+)   Anesthesia Other Findings Day of surgery medications reviewed with the patient.  Left breast mass   Reproductive/Obstetrics                             Anesthesia Physical Anesthesia Plan  ASA: II  Anesthesia Plan: General   Post-op Pain Management:    Induction: Intravenous  PONV Risk Score and Plan: 3 and Ondansetron, Dexamethasone, Midazolam and Scopolamine patch - Pre-op  Airway Management Planned: LMA  Additional Equipment:   Intra-op Plan:   Post-operative Plan: Extubation in OR  Informed Consent: I have reviewed the patients History and Physical, chart, labs and discussed the procedure including the risks, benefits and alternatives for the proposed anesthesia with the patient or authorized representative who has indicated his/her understanding and acceptance.   Dental advisory given  Plan Discussed with: CRNA  Anesthesia Plan Comments: (Risks/benefits of general anesthesia discussed with patient including risk of damage to teeth, lips, gum, and tongue, nausea/vomiting, allergic reactions to medications, and the possibility of  heart attack, stroke and death.  All patient questions answered.  Patient wishes to proceed.)        Anesthesia Quick Evaluation

## 2017-05-01 NOTE — Op Note (Signed)
Pre-op Diagnosis:  Intraductal papilloma - left breast Post-op Diagnosis: same Procedure:  Left radioactive seed localized lumpectomy Surgeon:  Corissa Oguinn K. Anesthesia:  GEN - LMA Indications:   This is a 61 year old female who recently underwent routine screening mammogram that showed an irregular mass in the upper inner left breast. This is located at 10:00 about 4 cm from the nipple. Ultrasound measured this a 1.2 x 0.5 x 0.7 cm. Axilla was negative. Biopsy showed intraductal papilloma. The patient is now referred for surgical evaluation for excision.  A radioactive seed was placed by radiology yesterday.  Description of procedure: The patient is brought to the operating room placed in supine position on the operating room table. After an adequate level of general anesthesia was obtained, her left breast was prepped with ChloraPrep and draped sterile fashion. A timeout was taken to ensure the proper patient and proper procedure. We interrogated the breast with the neoprobe. We made a circumareolar incision around the medial side of the nipple after infiltrating with 0.25% Marcaine. Dissection was carried down in the breast tissue with cautery. We used the neoprobe to guide [place] towards the radioactive seed. We excised an area of tissue around the radioactive seed 2 cm in diameter. The specimen was removed and was oriented with a paint kit. Specimen mammogram showed the radioactive seed as well as the biopsy clip within the specimen. This was sent for pathologic examination. There is no residual radioactivity within the biopsy cavity. We inspected carefully for hemostasis. The wound was thoroughly irrigated. The wound was closed with a deep layer of 3-0 Vicryl and a subcuticular layer of 4-0 Monocryl. Benzoin Steri-Strips were applied. The patient was then extubated and brought to the recovery room in stable condition. All sponge, instrument, and needle counts are correct.  Imogene Burn. Georgette Dover, MD,  [facility] Surgery  General/ Trauma Surgery  05/01/2017 10:12 AM

## 2017-05-01 NOTE — H&P (Signed)
History of Present Illness Nancy Deleon. Bhavesh Vazquez MD; 04/23/2017 10:45 AM) The patient is a 61 year old female who presents with a breast mass. PCP - [name], NP for left breast papilloma  This is a 61 year old female who recently underwent routine screening mammogram that showed an irregular mass in the upper inner left breast. This is located at 10:00 about 4 cm from the nipple. Ultrasound measured this a 1.2 x 0.5 x 0.7 cm. Axilla was negative. Biopsy showed intraductal papilloma. The patient is now referred for surgical evaluation for excision.  Menarche age 59 First pregnancy age 42 Breast-feed none Hormones 67 years Menopause age 43 Family history negative for breast cancer  CLINICAL DATA: Screening.  EXAM: 2D DIGITAL SCREENING BILATERAL MAMMOGRAM WITH CAD AND ADJUNCT TOMO  COMPARISON: Previous exam(s).  ACR Breast Density Category b: There are scattered areas of fibroglandular density.  FINDINGS: In the left breast, a possible asymmetry warrants further evaluation. In the right breast, no findings suspicious for malignancy. Images were processed with CAD.  IMPRESSION: Further evaluation is suggested for possible asymmetry in the left breast.  RECOMMENDATION: Diagnostic mammogram and possibly ultrasound of the left breast. (Code:[id number])  The patient will be contacted regarding the findings, and additional imaging will be scheduled.  BI-RADS CATEGORY 0: Incomplete. Need additional imaging evaluation and/or prior mammograms for comparison.   Electronically Signed By: [name] M.D. On: 03/29/2017 07:32  CLINICAL DATA: 61 year old patient recalled from recent screening mammogram for evaluation of a possible asymmetry in the left breast.  EXAM: 2D DIGITAL DIAGNOSTIC LEFT MAMMOGRAM WITH CAD AND ADJUNCT TOMO  ULTRASOUND LEFT BREAST  COMPARISON: Previous exam(s).  ACR Breast Density Category b: There are scattered areas of fibroglandular  density.  FINDINGS: Focal spot compression views of the upper inner left breast with tomography confirm an irregular mass measuring approximately 1.2 cm.  Mammographic images were processed with CAD.  On physical exam, no mass is palpated in the upper inner quadrant of the left breast.  Targeted ultrasound is performed, showing a heterogeneously hypoechoic irregular mass with indistinct margins at 10 o'clock position 4 cm from the nipple measuring 1.2 x 0.5 x 0.7 cm. There is some internal vascular flow.  Ultrasound of the left axilla is negative for lymphadenopathy.  IMPRESSION: 1.2 cm mass in the 10 o'clock position of the left breast is suspicious for malignancy.  RECOMMENDATION: Ultrasound-guided core needle biopsy is recommended and is being performed later today.  I have discussed the findings and recommendations with the patient. Results were also provided in writing at the conclusion of the visit. If applicable, a reminder letter will be sent to the patient regarding the next appointment.  BI-RADS CATEGORY 4: Suspicious.   Electronically Signed By: [name] M.D. On: 04/03/2017 15:42  CLINICAL DATA: Ultrasound-guided biopsy was recommended of a left breast mass in the 10 o'clock position.  EXAM: ULTRASOUND GUIDED LEFT BREAST CORE NEEDLE BIOPSY  COMPARISON: Previous exam(s).  FINDINGS: I met with the patient and we discussed the procedure of ultrasound-guided biopsy, including benefits and alternatives. We discussed the high likelihood of a successful procedure. We discussed the risks of the procedure, including infection, bleeding, tissue injury, clip migration, and inadequate sampling. Informed written consent was given. The usual time-out protocol was performed immediately prior to the procedure.  Lesion quadrant: Upper inner quadrant  Using sterile technique and 1% Lidocaine as local anesthetic, under direct ultrasound visualization, a 12  gauge spring-loaded device was used to perform biopsy of a mass in the  10 o'clock position of the left breast using a medial to lateral approach. At the conclusion of the procedure a tissue marker clip was deployed into the biopsy cavity. Follow up 2 view mammogram was performed and dictated separately.  IMPRESSION: Ultrasound guided biopsy of the left breast. No apparent complications.  Electronically Signed: By: [name] M.D. On: 04/03/2017 15:44  ADDENDUM: Pathology revealed an intraductal papilloma in the LEFT breast. Excision is recommended. This was found to be concordant by Dr. [name]. Pathology results were discussed with the patient by telephone. The patient reported doing well after the biopsy. Post biopsy instructions and care were reviewed and questions were answered. The patient was encouraged to call [facility] for any additional concerns. At the request of the patient, surgical consultation is scheduled in [place]. She has an appointment with Dr. Donnie Mesa at [facility] on April 23, 2017.  Pathology results reported by [name] RN, BSN on 04/05/2017.   Electronically Signed By: [name] M.D. On: 04/05/2017 08:09   Past Surgical History Nancy Deleon, RMA; 04/23/2017 10:06 AM) Cesarean Section - 1  Foot Surgery  Left.  Diagnostic Studies History Nancy Deleon, [place]; 04/23/2017 10:06 AM) Colonoscopy  1-5 years ago Mammogram  within last year Pap Smear  1-5 years ago  Allergies Nancy Deleon, RMA; 04/23/2017 10:07 AM) Hydrocodone-Acetaminophen *ANALGESICS - OPIOID*  Naproxen *ANALGESICS - ANTI-INFLAMMATORY*  Propoxyphene-Acetaminophen *ANALGESICS - OPIOID*  ROPINIROLE   Medication History Nancy Deleon, RMA; 04/23/2017 10:08 AM) Norvasc (10MG Tablet, Oral) Active. Calcium Carbonate-Vitamin D (Oral) Specific strength unknown - Active. Claritin  (10MG Tablet, Oral) Active. Medications Reconciled  Social History Nancy Deleon, [place]; 04/23/2017 10:06 AM) Alcohol use  Occasional alcohol use. Caffeine use  Carbonated beverages, Coffee, Tea. No drug use  Tobacco use  Former smoker.  Family History Nancy Deleon, [place]; 04/23/2017 10:06 AM) Cancer  Mother. Hypertension  Mother.  Pregnancy / Birth History Nancy Deleon, [place]; 04/23/2017 10:06 AM) Age at menarche  33 years. Age of menopause  84-50 Gravida  1 Maternal age  31-30 Para  1 Regular periods   Other Problems Nancy Deleon, [place]; 04/23/2017 10:06 AM) Gastroesophageal Reflux Disease     Review of Systems Nancy Deleon RMA; 04/23/2017 10:06 AM) General Not Present- Appetite Loss, Chills, Fatigue, Fever, Night Sweats, Weight Gain and Weight Loss. Skin Not Present- Change in Wart/Mole, Dryness, Hives, Jaundice, New Lesions, Non-Healing Wounds, Rash and Ulcer. HEENT Present- Wears glasses/contact lenses. Not Present- Earache, Hearing Loss, Hoarseness, Nose Bleed, Oral Ulcers, Ringing in the Ears, Seasonal Allergies, Sinus Pain, Sore Throat, Visual Disturbances and Yellow Eyes. Respiratory Not Present- Bloody sputum, Chronic Cough, Difficulty Breathing, Snoring and Wheezing. Breast Not Present- Breast Mass, Breast Pain, Nipple Discharge and Skin Changes. Cardiovascular Not Present- Chest Pain, Difficulty Breathing Lying Down, Leg Cramps, Palpitations, Rapid Heart Rate, Shortness of Breath and Swelling of Extremities. Gastrointestinal Not Present- Abdominal Pain, Bloating, Bloody Stool, Change in Bowel Habits, Chronic diarrhea, Constipation, Difficulty Swallowing, Excessive gas, Gets full quickly at meals, Hemorrhoids, Indigestion, Nausea, Rectal Pain and Vomiting. Female Genitourinary Not Present- Frequency, Nocturia, Painful Urination, Pelvic Pain and Urgency. Musculoskeletal Present- Back Pain. Not Present- Joint Pain, Joint Stiffness, Muscle Pain,  Muscle Weakness and Swelling of Extremities. Neurological Not Present- Decreased Memory, Fainting, Headaches, Numbness, Seizures, Tingling, Tremor, Trouble walking and Weakness. Psychiatric Not Present- Anxiety, Bipolar, Change in Sleep Pattern, Depression, Fearful and Frequent crying. Endocrine Present- Hot flashes. Not Present- Cold Intolerance, Excessive Hunger, Hair Changes, Heat Intolerance  and New Diabetes. Hematology Not Present- Blood Thinners, Easy Bruising, Excessive bleeding, Gland problems, HIV and Persistent Infections.  Vitals Nancy Deleon RMA; 04/23/2017 10:09 AM) 04/23/2017 10:08 AM Weight: 232.4 lb Height: 66in Body Surface Area: 2.13 m Body Mass Index: 37.51 kg/m  Temp.: 97.71F  Pulse: 67 (Regular)  BP: 140/86 (Sitting, Left Arm, Standard)       Physical Exam Rodman Key K. Maddison Kilner MD; 04/23/2017 10:46 AM) The physical exam findings are as follows: Note:WDWN in NAD Eyes: Pupils equal, round; sclera anicteric HENT: Oral mucosa moist; good dentition Neck: No masses palpated, no thyromegaly Lungs: CTA bilaterally; normal respiratory effort Breasts: symmetric; no palpable masses; healed right circumareolar incision; no axillary lymphadenopathy CV: Regular rate and rhythm; no murmurs; extremities well-perfused with no edema Abd: +bowel sounds, soft, non-tender, no palpable organomegaly; no palpable hernias Skin: Warm, dry; no sign of jaundice Psychiatric - alert and oriented x 4; calm mood and affect    Assessment & Plan Rodman Key K. 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 The surgical procedure has been discussed with the patient. Potential risks, benefits, alternative treatments, and expected outcomes have been explained. All of the patient's questions at this time have been answered. The likelihood of reaching the patient's treatment  goal is good. The patient understand the proposed surgical procedure and wishes to proceed.  Nancy Deleon. Georgette Dover, MD, [facility]  General/ Trauma Surgery  05/01/2017 9:15 AM

## 2017-05-01 NOTE — Anesthesia Procedure Notes (Signed)
Performed by: Marvelle Span D       

## 2017-05-01 NOTE — Anesthesia Postprocedure Evaluation (Signed)
Anesthesia Post Note  Patient: Nancy Deleon  Procedure(s) Performed: Procedure(s) (LRB): LEFT BREAST LUMPECTOMY WITH RADIOACTIVE SEED LOCALIZATION (Left)     Patient location during evaluation: PACU Anesthesia Type: General Level of consciousness: awake, awake and alert and oriented Pain management: pain level controlled Vital Signs Assessment: post-procedure vital signs reviewed and stable Respiratory status: spontaneous breathing, nonlabored ventilation and respiratory function stable Cardiovascular status: stable Postop Assessment: no headache and no signs of nausea or vomiting Anesthetic complications: no    Last Vitals:  Vitals:   05/01/17 1126 05/01/17 1143  BP:  132/61  Pulse: 75 76  Resp: 11 18  Temp:  36.7 C  SpO2: 99% 100%    Last Pain:  Vitals:   05/01/17 1143  TempSrc: Oral  PainSc: 0-No pain                 Catalina Gravel

## 2017-05-01 NOTE — Anesthesia Procedure Notes (Signed)
Procedure Name: LMA Insertion Date/Time: 05/01/2017 9:32 AM Performed by: Melynda Ripple D Pre-anesthesia Checklist: Patient identified, Emergency Drugs available, Suction available and Patient being monitored Patient Re-evaluated:Patient Re-evaluated prior to induction Oxygen Delivery Method: Circle system utilized Preoxygenation: Pre-oxygenation with 100% oxygen Induction Type: IV induction Ventilation: Mask ventilation without difficulty LMA: LMA inserted LMA Size: 4.0 Number of attempts: 1 Airway Equipment and Method: Bite block Placement Confirmation: positive ETCO2 Tube secured with: Tape Dental Injury: Teeth and Oropharynx as per pre-operative assessment

## 2017-05-01 NOTE — Transfer of Care (Signed)
Immediate Anesthesia Transfer of Care Note  Patient: Nancy Deleon  Procedure(s) Performed: Procedure(s): LEFT BREAST LUMPECTOMY WITH RADIOACTIVE SEED LOCALIZATION (Left)  Patient Location: PACU  Anesthesia Type:General  Level of Consciousness: awake, alert  and oriented  Airway & Oxygen Therapy: Patient Spontanous Breathing and Patient connected to face mask oxygen  Post-op Assessment: Report given to RN and Post -op Vital signs reviewed and stable  Post vital signs: Reviewed and stable  Last Vitals:  Vitals:   05/01/17 0754  BP: 131/60  Pulse: 60  Resp: 18  Temp: 36.4 C  SpO2: 100%    Last Pain:  Vitals:   05/01/17 0754  TempSrc: Oral      Patients Stated Pain Goal: 0 (83/41/96 2229)  Complications: No apparent anesthesia complications

## 2017-05-01 NOTE — Discharge Instructions (Signed)
[facility] °Office Phone Number [phone] ° °BREAST BIOPSY/ PARTIAL MASTECTOMY: POST OP INSTRUCTIONS ° °Always review your discharge instruction sheet given to you by the facility where your surgery was performed. ° °IF YOU HAVE DISABILITY OR FAMILY LEAVE FORMS, YOU MUST BRING THEM TO THE OFFICE FOR PROCESSING.  DO NOT GIVE THEM TO YOUR DOCTOR. ° °1. A prescription for pain medication may be given to you upon discharge.  Take your pain medication as prescribed, if needed.  If narcotic pain medicine is not needed, then you may take acetaminophen (Tylenol) or ibuprofen (Advil) as needed. °2. Take your usually prescribed medications unless otherwise directed °3. If you need a refill on your pain medication, please contact your pharmacy.  They will contact our office to request authorization.  Prescriptions will not be filled after 5pm or on week-ends. °4. You should eat very light the first 24 hours after surgery, such as soup, crackers, pudding, etc.  Resume your normal diet the day after surgery. °5. Most patients will experience some swelling and bruising in the breast.  Ice packs and a good support bra will help.  Swelling and bruising can take several days to resolve.  °6. It is common to experience some constipation if taking pain medication after surgery.  Increasing fluid intake and taking a stool softener will usually help or prevent this problem from occurring.  A mild laxative (Milk of Magnesia or Miralax) should be taken according to package directions if there are no bowel movements after 48 hours. °7. Unless discharge instructions indicate otherwise, you may remove your bandages 24-48 hours after surgery, and you may shower at that time.  You may have steri-strips (small skin tapes) in place directly over the incision.  These strips should be left on the skin for 7-10 days.  If your surgeon used skin glue on the incision, you may shower in 24 hours.  The glue will flake off over the  next 2-3 weeks.  Any sutures or staples will be removed at the office during your follow-up visit. °8. ACTIVITIES:  You may resume regular daily activities (gradually increasing) beginning the next day.  Wearing a good support bra or sports bra minimizes pain and swelling.  You may have sexual intercourse when it is comfortable. °a. You may drive when you no longer are taking prescription pain medication, you can comfortably wear a seatbelt, and you can safely maneuver your car and apply brakes. °b. RETURN TO WORK:  ______________________________________________________________________________________ °9. You should see your doctor in the office for a follow-up appointment approximately two weeks after your surgery.  Your doctor’s nurse will typically make your follow-up appointment when she calls you with your pathology report.  Expect your pathology report 2-3 business days after your surgery.  You may call to check if you do not hear from us after three days. °10. OTHER INSTRUCTIONS: _______________________________________________________________________________________________ _____________________________________________________________________________________________________________________________________ °_____________________________________________________________________________________________________________________________________ °_____________________________________________________________________________________________________________________________________ ° °WHEN TO CALL YOUR DOCTOR: °1. Fever over 101.0 °2. Nausea and/or vomiting. °3. Extreme swelling or bruising. °4. Continued bleeding from incision. °5. Increased pain, redness, or drainage from the incision. ° °The clinic staff is available to answer your questions during regular business hours.  Please don’t hesitate to call and ask to speak to one of the nurses for clinical concerns.  If you have a medical emergency, go to the nearest  emergency room or call 911.  A surgeon from [facility] is always on call at the hospital. ° °For further questions, please visit [email or web address]  ° ° °  Post Anesthesia Home Care Instructions ° °Activity: °Get plenty of rest for the remainder of the day. A responsible individual must stay with you for 24 hours following the procedure.  °For the next 24 hours, DO NOT: °-Drive a car °-Operate machinery °-Drink alcoholic beverages °-Take any medication unless instructed by your physician °-Make any legal decisions or sign important papers. ° °Meals: °Start with liquid foods such as gelatin or soup. Progress to regular foods as tolerated. Avoid greasy, spicy, heavy foods. If nausea and/or vomiting occur, drink only clear liquids until the nausea and/or vomiting subsides. Call your physician if vomiting continues. ° °Special Instructions/Symptoms: °Your throat may feel dry or sore from the anesthesia or the breathing tube placed in your throat during surgery. If this causes discomfort, gargle with warm salt water. The discomfort should disappear within 24 hours. ° °If you had a scopolamine patch placed behind your ear for the management of post- operative nausea and/or vomiting: ° °1. The medication in the patch is effective for 72 hours, after which it should be removed.  Wrap patch in a tissue and discard in the trash. Wash hands thoroughly with soap and water. °2. You may remove the patch earlier than 72 hours if you experience unpleasant side effects which may include dry mouth, dizziness or visual disturbances. °3. Avoid touching the patch. Wash your hands with soap and water after contact with the patch. °  °Call your surgeon if you experience:  ° °1.  Fever over 101.0. °2.  Inability to urinate. °3.  Nausea and/or vomiting. °4.  Extreme swelling or bruising at the surgical site. °5.  Continued bleeding from the incision. °6.  Increased pain, redness or drainage from the incision. °7.   Problems related to your pain medication. °8.  Any problems and/or concerns ° ° °

## 2017-05-03 ENCOUNTER — Encounter (HOSPITAL_BASED_OUTPATIENT_CLINIC_OR_DEPARTMENT_OTHER): Payer: Self-pay | Admitting: Surgery

## 2017-05-09 ENCOUNTER — Other Ambulatory Visit: Payer: Self-pay | Admitting: Family

## 2017-05-11 NOTE — Telephone Encounter (Signed)
90 Day supply of amlodipine sent to pharmacy. Pt was due for follow up in April and is past due. Mailed letter to pt to schedule appt as soon as possible.

## 2017-08-15 ENCOUNTER — Ambulatory Visit (INDEPENDENT_AMBULATORY_CARE_PROVIDER_SITE_OTHER): Admitting: Family

## 2017-08-15 ENCOUNTER — Encounter: Payer: Self-pay | Admitting: Family

## 2017-08-15 VITALS — BP 124/55 | HR 69 | Temp 99.2°F | Resp 16 | Ht 67.0 in | Wt 237.0 lb

## 2017-08-15 DIAGNOSIS — E049 Nontoxic goiter, unspecified: Secondary | ICD-10-CM

## 2017-08-15 DIAGNOSIS — D249 Benign neoplasm of unspecified breast: Secondary | ICD-10-CM | POA: Diagnosis not present

## 2017-08-15 DIAGNOSIS — J069 Acute upper respiratory infection, unspecified: Secondary | ICD-10-CM

## 2017-08-15 DIAGNOSIS — B353 Tinea pedis: Secondary | ICD-10-CM | POA: Diagnosis not present

## 2017-08-15 DIAGNOSIS — Z23 Encounter for immunization: Secondary | ICD-10-CM

## 2017-08-15 DIAGNOSIS — I10 Essential (primary) hypertension: Secondary | ICD-10-CM | POA: Diagnosis not present

## 2017-08-15 HISTORY — DX: Benign neoplasm of unspecified breast: D24.9

## 2017-08-15 MED ORDER — AMLODIPINE BESYLATE 10 MG PO TABS: 10.0000 mg | ORAL_TABLET | Freq: Every day | ORAL | 1 refills | Status: DC

## 2017-08-15 MED ORDER — FLUCONAZOLE 150 MG PO TABS: ORAL_TABLET | ORAL | 0 refills | Status: DC

## 2017-08-15 NOTE — Progress Notes (Signed)
Subjective:    Patient ID: Nancy Deleon, female    DOB: Oct 18, 1955, 62 y.o.   MRN: [id number]  HPI  Patient is a 62 year old female who presents today for follow-up.  Hypertension-she is maintained on amlodipine 10 mg once daily. BP Readings from Last 3 Encounters:  08/15/17 (!) 124/55  05/01/17 132/61  08/25/16 (!) 122/54   Nasal congestion-Reports that this started about 4 days ago, mild associated cough.    Foot problem-she reports a sore area between her toes on the left foot. Has been using otc lotrimin once daily without improvement in her symptoms.  Has associated itching.    Since her last visit she was diagnosed with an intraductal papilloma of the left breast.  She had a left radioactive seed localized lumpectomy which was performed on September 25 by Dr. Donnie Mesa.      Review of Systems    see HPI  Past Medical History:  Diagnosis Date  . Allergy   . Fatty liver 04/03/2014  . GERD (gastroesophageal reflux disease)   . Hypertension   . Left breast mass   . Obesity      Social History   Socioeconomic History  . Marital status: Widowed    Spouse name: Not on file  . Number of children: 2  . Years of education: Not on file  . Highest education level: Not on file  Social Needs  . Financial resource strain: Not on file  . Food insecurity - worry: Not on file  . Food insecurity - inability: Not on file  . Transportation needs - medical: Not on file  . Transportation needs - non-medical: Not on file  Occupational History    Employer: [employer]  Tobacco Use  . Smoking status: Former Smoker    Last attempt to quit: 04/18/1985    Years since quitting: 32.3  . Smokeless tobacco: Never Used  Substance and Sexual Activity  . Alcohol use: Yes    Alcohol/week: 0.0 oz    Comment: occ  . Drug use: No  . Sexual activity: Not Currently    Partners: Male    Birth control/protection: Post-menopausal    Comment: 1st intercourse 62 yo-Fewer than 5  partners  Other Topics Concern  . Not on file  Social History Narrative   Regular exercise:  Walks 2-5 x weekly   Caffeine Use: no   Widowed   She has a son [name]- he is 70 yrs old.   Foster parent.   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   Dog (Lab named Abby)          Past Surgical History:  Procedure Laterality Date  . BREAST BIOPSY  04/24/11   rigth breast  . BREAST LUMPECTOMY WITH RADIOACTIVE SEED LOCALIZATION Left 05/01/2017   Procedure: LEFT BREAST LUMPECTOMY WITH RADIOACTIVE SEED LOCALIZATION;  Surgeon: Donnie Mesa, MD;  Location: [place];  Service: General;  Laterality: Left;  . CESAREAN SECTION  [id number]  . COLONOSCOPY    . TUBAL LIGATION    . UPPER GASTROINTESTINAL ENDOSCOPY      Family History  Problem Relation Age of Onset  . Lymphoma Mother   . Dementia Mother   . Cancer Father        lung  . Colon cancer Maternal Uncle   . Esophageal cancer Neg Hx   . Pancreatic cancer Neg Hx   . Rectal  cancer Neg Hx   . Stomach cancer Neg Hx     Allergies  Allergen Reactions  . Naproxen Nausea And Vomiting    REACTION: vomiting  . Ropinirole Nausea Only  . Vicodin [Hydrocodone-Acetaminophen] Nausea And Vomiting  . Propoxyphene N-Acetaminophen Nausea And Vomiting    Current Outpatient Medications on File Prior to Visit  Medication Sig Dispense Refill  . amLODipine (NORVASC) 10 MG tablet TAKE 1 TABLET DAILY 90 tablet 0  . Calcium Carbonate-Vitamin D (CALTRATE 600+D) 600-400 MG-UNIT per tablet Take 1 tablet by mouth 2 (two) times daily.    Marland Kitchen ibuprofen (ADVIL,MOTRIN) 800 MG tablet Take by mouth.    . loratadine (CLARITIN) 10 MG tablet Take 1 tablet (10 mg total) by mouth daily. 30 tablet 2   No current facility-administered medications on file prior to visit.     BP (!) 124/55 (BP Location: Right Arm, Patient Position: Sitting, Cuff Size: Large)   Pulse 69   Temp 99.2 F (37.3 C)  (Oral)   Resp 16   Ht 5\' 7"  (1.702 m)   Wt 237 lb (107.5 kg)   LMP 02/28/2007   SpO2 100%   BMI 37.12 kg/m     Objective:   Physical Exam  Constitutional: She is oriented to person, place, and time. She appears well-developed and well-nourished.  HENT:  Head: Normocephalic and atraumatic.  Right Ear: Tympanic membrane and ear canal normal.  Left Ear: Tympanic membrane and ear canal normal.  Mouth/Throat: No oropharyngeal exudate, posterior oropharyngeal edema or posterior oropharyngeal erythema.  Eyes: No scleral icterus.  Neck: Neck supple.  Right sided neck fullness  Cardiovascular: Normal rate, regular rhythm and normal heart sounds.  No murmur heard. Pulmonary/Chest: Effort normal and breath sounds normal. No respiratory distress. She has no wheezes.  Musculoskeletal: She exhibits no edema.  Lymphadenopathy:    She has no cervical adenopathy.  Neurological: She is alert and oriented to person, place, and time.  Skin: Skin is warm and dry.  Thickened whitish tissue noted between fourth and fifth toes on left foot  Psychiatric: She has a normal mood and affect. Her behavior is normal. Judgment and thought content normal.          Assessment & Plan:  Hypertension-pressure is at goal on current dose of amlodipine.  Continue same.  Obtain follow-up basic metabolic panel  Question thyroid enlargement-will obtain ultrasound of thyroid to further evaluate.  Upper respiratory infection- mild symptoms, discussed supportive measures and to let us know if symptoms worsen or fail to improve in 4 days.  Tinea pedis-not improving with topical therapy.  Prescription is given for Diflucan.

## 2017-08-15 NOTE — Patient Instructions (Addendum)
Call if cough/cold symptoms worsen or if not improved in 4 days. You will be contacted about scheduling the ultrasound of your thyroid to further evaluate. Complete lab work prior to leaving.  For athlete's foot begin fluconazole one tablet today and repeat in 1 week.

## 2017-08-15 NOTE — Assessment & Plan Note (Signed)
Status post excision

## 2017-08-16 ENCOUNTER — Encounter (INDEPENDENT_AMBULATORY_CARE_PROVIDER_SITE_OTHER): Payer: Self-pay

## 2017-08-16 ENCOUNTER — Ambulatory Visit (HOSPITAL_BASED_OUTPATIENT_CLINIC_OR_DEPARTMENT_OTHER)
Admission: RE | Admit: 2017-08-16 | Discharge: 2017-08-16 | Disposition: A | Discharge status: Home / Self Care | Source: Ambulatory Visit | Attending: Family | Admitting: Family

## 2017-08-16 DIAGNOSIS — E049 Nontoxic goiter, unspecified: Secondary | ICD-10-CM

## 2017-08-20 ENCOUNTER — Encounter: Payer: Self-pay | Admitting: Family

## 2017-08-20 ENCOUNTER — Telehealth: Payer: Self-pay | Admitting: Family

## 2017-08-20 DIAGNOSIS — E042 Nontoxic multinodular goiter: Secondary | ICD-10-CM

## 2017-08-20 HISTORY — DX: Nontoxic multinodular goiter: E04.2

## 2017-08-20 NOTE — Telephone Encounter (Signed)
US shows multiple thyroid nodules.  Radiology is recommending thyroid biopsy.  I have pended order for biopsy. I would also like to get her set up with endo and have her return to the lab for some additional thyroid testing.

## 2017-08-20 NOTE — Telephone Encounter (Signed)
Notified pt. She is agreeable to proceed with biposy, referral and labs. Lab appt scheduled for tomorrow at 10am and future orders have been entered.

## 2017-08-21 ENCOUNTER — Other Ambulatory Visit (INDEPENDENT_AMBULATORY_CARE_PROVIDER_SITE_OTHER)

## 2017-08-21 DIAGNOSIS — E042 Nontoxic multinodular goiter: Secondary | ICD-10-CM | POA: Diagnosis not present

## 2017-08-21 LAB — T4, FREE: FREE T4: 0.77 ng/dL (ref 0.60–1.60)

## 2017-08-21 LAB — TSH: TSH: 1.14 u[IU]/mL (ref 0.35–4.50)

## 2017-08-21 LAB — T3, FREE: T3, Free: 3.3 pg/mL (ref 2.3–4.2)

## 2017-08-22 ENCOUNTER — Encounter: Payer: Self-pay | Admitting: Family

## 2017-08-22 ENCOUNTER — Telehealth: Payer: Self-pay | Admitting: Family

## 2017-08-22 DIAGNOSIS — E042 Nontoxic multinodular goiter: Secondary | ICD-10-CM

## 2017-08-22 NOTE — Telephone Encounter (Signed)
See 08/22/17 telephone encounter.

## 2017-08-22 NOTE — Telephone Encounter (Signed)
Copied from [place] #[id number]. Topic: Quick Communication - See Telephone Encounter >> Aug 22, 2017  1:26 PM Aurelio Brash B wrote: CRM for notification. See Telephone encounter for:  Pt is asking for results from labs drawn  1/15 08/22/17.

## 2017-08-27 NOTE — Addendum Note (Signed)
Addended by: Kelle Darting A on: 08/27/2017 09:47 AM   Modules accepted: Orders

## 2017-09-05 ENCOUNTER — Inpatient Hospital Stay: Admission: RE | Admit: 2017-09-05 | Source: Ambulatory Visit

## 2017-09-05 ENCOUNTER — Other Ambulatory Visit

## 2017-09-12 ENCOUNTER — Ambulatory Visit (INDEPENDENT_AMBULATORY_CARE_PROVIDER_SITE_OTHER): Admitting: Family

## 2017-09-12 ENCOUNTER — Encounter: Payer: Self-pay | Admitting: Family

## 2017-09-12 VITALS — BP 134/66 | HR 59 | Temp 98.6°F | Resp 16 | Ht 67.0 in | Wt 238.6 lb

## 2017-09-12 DIAGNOSIS — F419 Anxiety disorder, unspecified: Secondary | ICD-10-CM | POA: Diagnosis not present

## 2017-09-12 DIAGNOSIS — Z114 Encounter for screening for human immunodeficiency virus [HIV]: Secondary | ICD-10-CM

## 2017-09-12 DIAGNOSIS — Z1159 Encounter for screening for other viral diseases: Secondary | ICD-10-CM

## 2017-09-12 DIAGNOSIS — Z0001 Encounter for general adult medical examination with abnormal findings: Secondary | ICD-10-CM

## 2017-09-12 DIAGNOSIS — B353 Tinea pedis: Secondary | ICD-10-CM

## 2017-09-12 DIAGNOSIS — Z0000 Encounter for general adult medical examination without abnormal findings: Secondary | ICD-10-CM

## 2017-09-12 DIAGNOSIS — J309 Allergic rhinitis, unspecified: Secondary | ICD-10-CM

## 2017-09-12 MED ORDER — HYDROXYZINE HCL 25 MG PO TABS: 25.0000 mg | ORAL_TABLET | Freq: Three times a day (TID) | ORAL | 0 refills | Status: DC | PRN

## 2017-09-12 MED ORDER — MONTELUKAST SODIUM 10 MG PO TABS: 10.0000 mg | ORAL_TABLET | Freq: Every day | ORAL | 3 refills | Status: DC

## 2017-09-12 NOTE — Progress Notes (Signed)
Subjective:    Patient ID: Nancy Deleon, female    DOB: 1955/11/26, 62 y.o.   MRN: [id number]  HPI  Patient presents today for complete physical.  Immunizations: td 5/10, flu shot 1/19 Diet: "off track" Wt Readings from Last 3 Encounters:  09/12/17 238 lb 9.6 oz (108.2 kg)  08/15/17 237 lb (107.5 kg)  05/01/17 229 lb 6 oz (104 kg)  Exercise: some, walks "when I can" Colonoscopy: 4/11- normal Dexa:  02/22/16- normal Pap Smear: 03/04/14 Mammogram: 03/28/17 Vision:  2018, has an appointment later this month Dental: up to date  Anxiety- reports that she has intermittent anxiety- especially when she visits her mom at the nursing home. Wants something for PRN   Tinea pedis- reports no improvement with topica antifungals or oral diflucan  Cough/nasal drainage- occurs at night. Not taking claritin Review of Systems  Constitutional: Negative for unexpected weight change.  HENT: Negative for hearing loss and postnasal drip.   Eyes: Negative for visual disturbance.  Respiratory: Positive for cough.   Cardiovascular: Negative for leg swelling.  Gastrointestinal: Negative for blood in stool, constipation and diarrhea.  Genitourinary: Negative for dysuria and frequency.  Musculoskeletal: Negative for arthralgias and myalgias.  Skin: Negative for rash.  Neurological: Negative for headaches.  Hematological: Negative for adenopathy.  Psychiatric/Behavioral:       Denies depression/anxiety   Past Medical History:  Diagnosis Date  . Allergy   . Fatty liver 04/03/2014  . GERD (gastroesophageal reflux disease)   . Hypertension   . 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 Left breast mass   . Multiple thyroid nodules 08/20/2017  . Obesity      Social History   Socioeconomic History  . Marital status: Widowed    Spouse name: Not on file  . Number of children: 2  . Years of education: Not on file  . Highest education level: Not on file  Social Needs  . Financial resource  strain: Not on file  . Food insecurity - worry: Not on file  . Food insecurity - inability: Not on file  . Transportation needs - medical: Not on file  . Transportation needs - non-medical: Not on file  Occupational History    Employer: [employer]  Tobacco Use  . Smoking status: Former Smoker    Last attempt to quit: 04/18/1985    Years since quitting: 32.4  . Smokeless tobacco: Never Used  Substance and Sexual Activity  . Alcohol use: Yes    Alcohol/week: 0.0 oz    Comment: occ  . Drug use: No  . Sexual activity: Not Currently    Partners: Male    Birth control/protection: Post-menopausal    Comment: 1st intercourse 62 yo-Fewer than 5 partners  Other Topics Concern  . Not on file  Social History Narrative   Regular exercise:  Walks 2-5 x weekly   Caffeine Use: no   Widowed   She has a son [name]- he is 54 yrs old.   Foster parent.   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   Dog (Lab named Abby)          Past Surgical History:  Procedure Laterality Date  . BREAST BIOPSY  04/24/11   rigth breast  . BREAST LUMPECTOMY WITH RADIOACTIVE SEED LOCALIZATION Left 05/01/2017   Procedure: LEFT BREAST LUMPECTOMY WITH RADIOACTIVE SEED LOCALIZATION;  Surgeon: Donnie Mesa, MD;  Location: [facility];  Service: General;  Laterality: Left;  . CESAREAN SECTION  [id number]  . COLONOSCOPY    . TUBAL LIGATION    . UPPER GASTROINTESTINAL ENDOSCOPY      Family History  Problem Relation Age of Onset  . Lymphoma Mother   . Dementia Mother   . Cancer Father        lung  . Colon cancer Maternal Uncle   . Esophageal cancer Neg Hx   . Pancreatic cancer Neg Hx   . Rectal cancer Neg Hx   . Stomach cancer Neg Hx     Allergies  Allergen Reactions  . Naproxen Nausea And Vomiting    REACTION: vomiting  . Ropinirole Nausea Only  . Vicodin [Hydrocodone-Acetaminophen] Nausea And Vomiting  . Propoxyphene  N-Acetaminophen Nausea And Vomiting    Current Outpatient Medications on File Prior to Visit  Medication Sig Dispense Refill  . amLODipine (NORVASC) 10 MG tablet Take 1 tablet (10 mg total) by mouth daily. 90 tablet 1  . Calcium Carbonate-Vitamin D (CALTRATE 600+D) 600-400 MG-UNIT per tablet Take 1 tablet by mouth 2 (two) times daily.    . fluconazole (DIFLUCAN) 150 MG tablet 1 tablet by mouth today and again in 1 week. 2 tablet 0  . ibuprofen (ADVIL,MOTRIN) 800 MG tablet Take by mouth.    . loratadine (CLARITIN) 10 MG tablet Take 1 tablet (10 mg total) by mouth daily. 30 tablet 2   No current facility-administered medications on file prior to visit.     BP 134/66 (BP Location: Left Arm, Patient Position: Sitting, Cuff Size: Large)   Pulse (!) 59   Temp 98.6 F (37 C) (Oral)   Resp 16   Ht 5\' 7"  (1.702 m)   Wt 238 lb 9.6 oz (108.2 kg)   LMP 02/28/2007   SpO2 100%   BMI 37.37 kg/m       Objective:   Physical Exam  Physical Exam  Constitutional: She is oriented to person, place, and time. She appears well-developed and well-nourished. No distress.  HENT:  Head: Normocephalic and atraumatic.  Right Ear: Tympanic membrane and ear canal normal.  Left Ear: Tympanic membrane and ear canal normal.  Mouth/Throat: Oropharynx is clear and moist.  Eyes: Pupils are equal, round, and reactive to light. No scleral icterus.  Neck: Normal range of motion. No thyromegaly present.  Cardiovascular: Normal rate and regular rhythm.   No murmur heard. Pulmonary/Chest: Effort normal and breath sounds normal. No respiratory distress. He has no wheezes. She has no rales. She exhibits no tenderness.  Abdominal: Soft. Bowel sounds are normal. She exhibits no distension and no mass. There is no tenderness. There is no rebound and no guarding.  Musculoskeletal: She exhibits no edema.  Lymphadenopathy:    She has no cervical adenopathy.  Neurological: She is alert and oriented to person, place, and  time. She has normal patellar reflexes. She exhibits normal muscle tone. Coordination normal.  Skin: Skin is warm and dry. tinea noted between left 3rd/4th toes Psychiatric: She has a normal mood and affect. Her behavior is normal. Judgment and thought content normal.  Breasts: Examined lying Right: Without masses, retractions, discharge or axillary adenopathy.  Left: Without masses, retractions, discharge or axillary adenopathy.  Pelvic: deferred           Assessment & Plan:   Anxiety- rx provided for prn hydroxyzine.  Tinea pedis- refer to podiatry.   Allergic rhinitis- cough, restart claritin, add singulair.     Preventative care- has  pap scheduled Friday with gyn. Discussed diet/exercise/weight loss. Obtain routine lab work.  Assessment & Plan:

## 2017-09-12 NOTE — Patient Instructions (Addendum)
Please go to your pharmacy to see if they have the shingles vaccine. Work on [employer] and increasing your exercise (goal is 30 minutes 5 days a week of walking/cardio). You may use hydroxyzine as needed for anxiety.  Restart claritin and add singulair for nasal drainage/cough.

## 2017-09-13 ENCOUNTER — Ambulatory Visit
Admission: RE | Admit: 2017-09-13 | Discharge: 2017-09-13 | Disposition: A | Discharge status: Home / Self Care | Source: Ambulatory Visit | Attending: Family | Admitting: Family

## 2017-09-13 ENCOUNTER — Other Ambulatory Visit (HOSPITAL_COMMUNITY)
Admission: RE | Admit: 2017-09-13 | Discharge: 2017-09-13 | Disposition: A | Discharge status: Home / Self Care | Source: Ambulatory Visit | Attending: Radiology | Admitting: Radiology

## 2017-09-13 DIAGNOSIS — E042 Nontoxic multinodular goiter: Secondary | ICD-10-CM | POA: Insufficient documentation

## 2017-09-17 ENCOUNTER — Ambulatory Visit (INDEPENDENT_AMBULATORY_CARE_PROVIDER_SITE_OTHER): Admitting: Sports Medicine

## 2017-09-17 ENCOUNTER — Encounter: Payer: Self-pay | Admitting: Sports Medicine

## 2017-09-17 DIAGNOSIS — L988 Other specified disorders of the skin and subcutaneous tissue: Secondary | ICD-10-CM | POA: Diagnosis not present

## 2017-09-17 DIAGNOSIS — B359 Dermatophytosis, unspecified: Secondary | ICD-10-CM | POA: Diagnosis not present

## 2017-09-17 MED ORDER — CLOTRIMAZOLE 1 % EX SOLN: 1.0000 "application " | Freq: Two times a day (BID) | CUTANEOUS | 5 refills | Status: DC

## 2017-09-17 NOTE — Progress Notes (Signed)
Subjective: Nancy Deleon is a 62 y.o. female patient seen today in office with complaint of itchiness in between 4-5th toes on the left foot for the last 2 weeks, tried lotrimin with no improvement. Denies swelling, drainage, nausea, vomiting, fever, and chills. Patient has no other pedal complaints at this time.   Patient Active Problem List   Diagnosis Date Noted  . Anxiety 09/12/2017  . Multiple thyroid nodules 08/20/2017  . 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 Allergic rhinitis 11/24/2015  . Back pain of thoracolumbar region 08/10/2014  . Renal cyst, left 08/10/2014  . Musculoskeletal pain 05/14/2014  . Fatty liver 04/03/2014  . Abdominal pain, epigastric 04/01/2014  . Second degree AV block, Mobitz type I 03/23/2014  . GERD (gastroesophageal reflux disease) 02/09/2014  . Nonspecific abnormal electrocardiogram (ECG) (EKG) 02/09/2014  . Pain in joint, ankle and foot 11/17/2013  . Headache(784.0) 08/15/2013  . Routine general medical examination at a health care facility 02/21/2012  . Skin rash 12/18/2011  . Seasonal allergies 11/21/2011  . ANEMIA, IRON DEFICIENCY 12/30/2008  . Hyperglycemia 10/31/2007  . Essential hypertension 10/31/2007  . Impaired fasting glucose 10/31/2007    Current Outpatient Medications on File Prior to Visit  Medication Sig Dispense Refill  . amLODipine (NORVASC) 10 MG tablet Take 1 tablet (10 mg total) by mouth daily. 90 tablet 1  . Calcium Carbonate-Vitamin D (CALTRATE 600+D) 600-400 MG-UNIT per tablet Take 1 tablet by mouth 2 (two) times daily.    . fluconazole (DIFLUCAN) 150 MG tablet 1 tablet by mouth today and again in 1 week. 2 tablet 0  . hydrOXYzine (ATARAX/VISTARIL) 25 MG tablet Take 1 tablet (25 mg total) by mouth 3 (three) times daily as needed for anxiety. 30 tablet 0  . ibuprofen (ADVIL,MOTRIN) 800 MG tablet Take by mouth.    . loratadine (CLARITIN) 10 MG tablet Take 1 tablet (10 mg total) by mouth daily. 30 tablet 2  .  montelukast (SINGULAIR) 10 MG tablet Take 1 tablet (10 mg total) by mouth at bedtime. 30 tablet 3   No current facility-administered medications on file prior to visit.     Allergies  Allergen Reactions  . Naproxen Nausea And Vomiting    REACTION: vomiting  . Ropinirole Nausea Only  . Vicodin [Hydrocodone-Acetaminophen] Nausea And Vomiting  . Propoxyphene N-Acetaminophen Nausea And Vomiting    Objective: Physical Exam  General: Well developed, nourished, no acute distress, awake, alert and oriented x 3  Vascular: Dorsalis pedis artery 2/4 bilateral, Posterior tibial artery 2/4 bilateral, skin temperature warm to warm proximal to distal bilateral lower extremities, no varicosities, pedal hair present bilateral.  Neurological: Gross sensation present via light touch bilateral.   Dermatological: Skin is warm, dry, and supple bilateral, Nails 1-10 are short and thick currently asymptomatic, + webspace macerations present left 3-4th webspaces, no open lesions present bilateral, no callus/corns/hyperkeratotic tissue present bilateral. No other signs of infection bilateral.  Musculoskeletal: Asymptomatic hammertoe boney deformities noted bilateral. Muscular strength within normal limits without painon range of motion. No pain with calf compression bilateral.  Assessment and Plan:  Problem List Items Addressed This Visit    None    Visit Diagnoses    Tinea    -  Primary   Relevant Medications   clotrimazole (LOTRIMIN) 1 % external solution   Maceration of skin          -Examined patient.  -Discussed treatment options for tinea with maceration  -Applied Castillani's paint -Rx Clotrimazole solution -Advised good hygiene  habits and to avoid using cream in between toes -Patient to return in 1 month for follow up evaluation or sooner if symptoms worsen.  Landis Martins, DPM

## 2017-09-18 ENCOUNTER — Other Ambulatory Visit

## 2017-09-19 ENCOUNTER — Telehealth: Payer: Self-pay | Admitting: Family

## 2017-09-19 NOTE — Telephone Encounter (Signed)
Copied from [place]. Topic: Quick Communication - See Telephone Encounter >> Sep 19, 2017  3:35 PM Ivar Drape wrote: CRM for notification. See Telephone encounter for:  09/19/17. Patient would like the results of her Thyroid biopsy that she took last week.  Also the patient would like to know if she still needs to see Thyroid specialist (Dr. Dwyane Dee), because she is not having any problems.

## 2017-09-21 ENCOUNTER — Ambulatory Visit (INDEPENDENT_AMBULATORY_CARE_PROVIDER_SITE_OTHER): Admitting: Gynecology

## 2017-09-21 ENCOUNTER — Encounter: Payer: Self-pay | Admitting: Gynecology

## 2017-09-21 ENCOUNTER — Other Ambulatory Visit: Payer: Self-pay | Admitting: Family

## 2017-09-21 VITALS — BP 124/80 | Ht 68.0 in | Wt 237.0 lb

## 2017-09-21 DIAGNOSIS — N952 Postmenopausal atrophic vaginitis: Secondary | ICD-10-CM

## 2017-09-21 DIAGNOSIS — Z01419 Encounter for gynecological examination (general) (routine) without abnormal findings: Secondary | ICD-10-CM

## 2017-09-21 DIAGNOSIS — Z01411 Encounter for gynecological examination (general) (routine) with abnormal findings: Secondary | ICD-10-CM

## 2017-09-21 MED ORDER — BUSPIRONE HCL 7.5 MG PO TABS: 7.5000 mg | ORAL_TABLET | Freq: Two times a day (BID) | ORAL | 1 refills | Status: DC

## 2017-09-21 NOTE — Telephone Encounter (Signed)
Please patient know that I reviewed the results of her thyroid biopsy. Biopsy does not indicate cancer.  I would recommend that she follow-up with Dr. Dwyane Dee for ongoing management.  I am not certain if he may wish to repeat this biopsy or continue to monitor her nodules.  I will defer to him.

## 2017-09-21 NOTE — Telephone Encounter (Signed)
Notified pt and she voices understanding. Pt states the hydroxyzine that she is taking for her anxiety is making her sleepy and wants to know if there is another alternative.

## 2017-09-21 NOTE — Progress Notes (Signed)
    Nancy Deleon 06-26-1956 [id number]        62 y.o.  G1P1 for annual gynecologic exam.  Doing well without gynecologic complaints.  Past medical history,surgical history, problem list, medications, allergies, family history and social history were all reviewed and documented as reviewed in the EPIC chart.  ROS:  Performed with pertinent positives and negatives included in the history, assessment and plan.   Additional significant findings : None   Exam: [name] assistant Vitals:   09/21/17 1143  BP: 124/80  Weight: 237 lb (107.5 kg)  Height: 5\' 8"  (1.727 m)   Body mass index is 36.04 kg/m.  General appearance:  Normal affect, orientation and appearance. Skin: Grossly normal HEENT: Without gross lesions.  No cervical or supraclavicular adenopathy. Thyroid normal.  Lungs:  Clear without wheezing, rales or rhonchi Cardiac: RR, without RMG Abdominal:  Soft, nontender, without masses, guarding, rebound, organomegaly or hernia Breasts:  Examined lying and sitting without masses, retractions, discharge or axillary adenopathy. Pelvic:  Ext, BUS, Vagina: With atrophic changes  Cervix: With atrophic changes  Uterus: Anteverted, normal size, shape and contour, midline and mobile nontender   Adnexa: Without masses or tenderness    Anus and perineum: Normal   Rectovaginal: Normal sphincter tone without palpated masses or tenderness.    Assessment/Plan:  62 y.o. G1P1 female for annual gynecologic exam.   1. Postmenopausal/atrophic genital changes.  No significant hot flushes, night sweats, vaginal dryness or any bleeding.  Continue to monitor and report any issues or bleeding. 2. Pap smear/HPV 02/2014.  No Pap smear done today.  No history of abnormal Pap smears.  Plan repeat Pap smear next year at 5-year interval per current screening guidelines. 3. Mammography 03/2017.  Continue with annual mammography when due.  SBE monthly reviewed.  Breast exam normal today. 4. Colonoscopy  2011.  Repeat at their recommended interval. 5. DEXA 2017 normal.  Repeat at age 62. 6. Health maintenance.  No routine lab work done as patient does this elsewhere.  Follow-up 1 year, sooner as needed.   Anastasio Auerbach MD, 12:06 PM 09/21/2017

## 2017-09-21 NOTE — Telephone Encounter (Signed)
Left message on voicemail to check mychart message. Message sent.

## 2017-09-21 NOTE — Telephone Encounter (Signed)
D/c hydroxyzine, start buspar bid. rx sent. Follow up in 1 month.

## 2017-09-21 NOTE — Patient Instructions (Signed)
Follow-up in 1 year, sooner as needed. 

## 2017-10-01 ENCOUNTER — Encounter: Payer: Self-pay | Admitting: Endocrinology

## 2017-10-01 ENCOUNTER — Ambulatory Visit (INDEPENDENT_AMBULATORY_CARE_PROVIDER_SITE_OTHER): Admitting: Endocrinology

## 2017-10-01 VITALS — BP 138/84 | HR 67 | Ht 68.0 in | Wt 238.8 lb

## 2017-10-01 DIAGNOSIS — E042 Nontoxic multinodular goiter: Secondary | ICD-10-CM

## 2017-10-01 NOTE — Progress Notes (Signed)
Patient ID: Nancy Deleon, female   DOB: Mar 15, 1956, 62 y.o.   MRN: [id number]           Reason for Appointment: Goiter, new consultation    History of Present Illness:   Patient has been referred by her PCP [name]  The patient's thyroid enlargement was first discovered in 08/2017 At that time she was being seen by her PCP for general follow-up and respiratory infection She was found to have a thyroid enlargement on the right side and referred for an ultrasound  She has had no difficulty with swallowing   Does not feel like she has any choking sensation in her neck or pressure in any position or when lying down. Has not previously been told to have a swelling in her thyroid  Lab Results  Component Value Date   FREET4 0.77 08/21/2017   TSH 1.14 08/21/2017   TSH 0.66 02/16/2016   TSH 0.895 02/19/2013    She has had an ultrasound exam in 08/2017 which shows thyroid enlargement bilaterally, left lobe measuring 6.5 cm Also she was estimated to have 5 nodules over 1 cm Biopsy was recommended for 3 of the nodules but on follow-up exam it was felt that 2 of them were pseudo-nodules  Biopsy that was done was on the right inferior intermediate nodule and she had 4 fine-needle aspiration was done, this is a 2.1 cm solid nodule with TI-RADS score 4  However the cytology was inadequate for diagnosis with scant follicular epithelial  She is now referred here for further management    Allergies as of 10/01/2017      Reactions   Naproxen Nausea And Vomiting   REACTION: vomiting   Ropinirole Nausea Only   Vicodin [hydrocodone-acetaminophen] Nausea And Vomiting   Propoxyphene N-acetaminophen Nausea And Vomiting      Medication List        Accurate as of 10/01/17  1:22 PM. Always use your most recent med list.          amLODipine 10 MG tablet Commonly known as:  NORVASC Take 1 tablet (10 mg total) by mouth daily.   busPIRone 7.5 MG tablet Commonly known as:   BUSPAR Take 1 tablet (7.5 mg total) by mouth 2 (two) times daily.   CALTRATE 600+D 600-400 MG-UNIT tablet Generic drug:  Calcium Carbonate-Vitamin D Take 1 tablet by mouth 2 (two) times daily.   ibuprofen 800 MG tablet Commonly known as:  ADVIL,MOTRIN Take by mouth.   loratadine 10 MG tablet Commonly known as:  CLARITIN Take 1 tablet (10 mg total) by mouth daily.       Allergies:  Allergies  Allergen Reactions  . Naproxen Nausea And Vomiting    REACTION: vomiting  . Ropinirole Nausea Only  . Vicodin [Hydrocodone-Acetaminophen] Nausea And Vomiting  . Propoxyphene N-Acetaminophen Nausea And Vomiting    Past Medical History:  Diagnosis Date  . Allergy   . Fatty liver 04/03/2014  . GERD (gastroesophageal reflux disease)   . Hypertension   . 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 Left breast mass   . Multiple thyroid nodules 08/20/2017  . Obesity     Past Surgical History:  Procedure Laterality Date  . BIOPSY THYROID    . BREAST BIOPSY  04/24/11   rigth breast  . BREAST LUMPECTOMY WITH RADIOACTIVE SEED LOCALIZATION Left 05/01/2017   Procedure: LEFT BREAST LUMPECTOMY WITH RADIOACTIVE SEED LOCALIZATION;  Surgeon: Donnie Mesa, MD;  Location: [facility];  Service: General;  Laterality: Left;  . CESAREAN SECTION  [id number]  . COLONOSCOPY    . TUBAL LIGATION    . UPPER GASTROINTESTINAL ENDOSCOPY      Family History  Problem Relation Age of Onset  . Lymphoma Mother   . Dementia Mother   . Cancer Father        lung  . Colon cancer Maternal Uncle   . Esophageal cancer Neg Hx   . Pancreatic cancer Neg Hx   . Rectal cancer Neg Hx   . Stomach cancer Neg Hx   . Thyroid disease Neg Hx     Social History:  reports that she quit smoking about 32 years ago. she has never used smokeless tobacco. She reports that she drinks alcohol. She reports that she does not use drugs.   Review of Systems:  Review of Systems  Constitutional: Negative for weight  loss.  HENT: Negative for trouble swallowing.   Respiratory: Negative for shortness of breath.   Cardiovascular: Negative for leg swelling.  Gastrointestinal: Negative for abdominal pain.  Endocrine: Negative for fatigue.  Musculoskeletal: Positive for back pain.       At times   She is being followed by her PCP for hypertension   Examination:   BP 138/84 (BP Location: Left Arm, Patient Position: Sitting, Cuff Size: Normal)   Pulse 67   Ht 5\' 8"  (1.727 m)   Wt 238 lb 12.8 oz (108.3 kg)   LMP 02/28/2007   SpO2 97%   BMI 36.31 kg/m    General Appearance: pleasant, has mild generalized obesity          Eyes: No swelling.           Neck: The thyroid is enlarged and palpable mostly on the right side. The thyroid is slightly firm, nodular without any distinct nodules, no tenderness Neck circumference is 40  cm over the mid thyroid There is no stridor. Pemberton sign is negative  There is no lymphadenopathy.     Cardiovascular: Normal  heart sounds, no murmur No carotid bruit heard  Respiratory:  Lungs clear Neurological: REFLEXES: at biceps are normal.  Skin: no rash      Extremities no edema or peripheral joints enlargement  Assessment/Plan:  Multinodular goiter, incidentally diagnosed in 08/2017 Normal thyroid functions  Reports of thyroid ultrasound and biopsy and cytology were reviewed in detail  Although she had multiple nodules on initial ultrasound the largest nodule was apparently was a pseudo- nodule She had a biopsy of the intermediate nodule on the right inferior thyroid lobe which had only scant follicular cells on aspiration  This nodule is relatively low-grade on ultrasound and only 2.1 cm in size  Recommend that she be reexamined in 6 months and consider follow-up ultrasound at that time rather than repeat biopsy Patient was explained the nature of thyroid nodules, current findings and biopsy report She is in favor of not doing another biopsy at this  time   A copy of the consultation note is sent to the referring physician  Elayne Snare 10/01/2017

## 2017-10-19 ENCOUNTER — Encounter: Payer: Self-pay | Admitting: Family

## 2017-10-19 ENCOUNTER — Ambulatory Visit (INDEPENDENT_AMBULATORY_CARE_PROVIDER_SITE_OTHER): Admitting: Family

## 2017-10-19 VITALS — BP 122/56 | HR 66 | Temp 98.4°F | Resp 16 | Ht 68.0 in | Wt 236.6 lb

## 2017-10-19 DIAGNOSIS — K219 Gastro-esophageal reflux disease without esophagitis: Secondary | ICD-10-CM

## 2017-10-19 DIAGNOSIS — I10 Essential (primary) hypertension: Secondary | ICD-10-CM

## 2017-10-19 DIAGNOSIS — F419 Anxiety disorder, unspecified: Secondary | ICD-10-CM | POA: Diagnosis not present

## 2017-10-19 DIAGNOSIS — E042 Nontoxic multinodular goiter: Secondary | ICD-10-CM | POA: Diagnosis not present

## 2017-10-19 LAB — LIPID PANEL
CHOL/HDL RATIO: 3
Cholesterol: 171 mg/dL (ref 0–200)
HDL: 57 mg/dL (ref >39.00)
LDL CALC: 98 mg/dL (ref 0–99)
NONHDL: 113.66
Triglycerides: 78 mg/dL (ref 0.0–149.0)
VLDL: 15.6 mg/dL (ref 0.0–40.0)

## 2017-10-19 LAB — URINALYSIS, ROUTINE W REFLEX MICROSCOPIC
BILIRUBIN URINE: NEGATIVE
Hgb urine dipstick: NEGATIVE
KETONES UR: NEGATIVE
LEUKOCYTES UA: NEGATIVE
Nitrite: NEGATIVE
PH: 7 (ref 5.0–8.0)
SPECIFIC GRAVITY, URINE: 1.015 (ref 1.000–1.030)
Total Protein, Urine: NEGATIVE
UROBILINOGEN UA: 0.2 (ref 0.0–1.0)
Urine Glucose: NEGATIVE

## 2017-10-19 LAB — CBC WITH DIFFERENTIAL/PLATELET
BASOS ABS: 0.1 10*3/uL (ref 0.0–0.1)
BASOS PCT: 1.4 % (ref 0.0–3.0)
EOS ABS: 0.2 10*3/uL (ref 0.0–0.7)
Eosinophils Relative: 3.8 % (ref 0.0–5.0)
HEMATOCRIT: 37.4 % (ref 36.0–46.0)
Hemoglobin: 12.9 g/dL (ref 12.0–15.0)
Lymphocytes Relative: 36.6 % (ref 12.0–46.0)
Lymphs Abs: 2.1 10*3/uL (ref 0.7–4.0)
MCHC: 34.6 g/dL (ref 30.0–36.0)
MCV: 74.5 fl — ABNORMAL LOW (ref 78.0–100.0)
Monocytes Absolute: 0.4 10*3/uL (ref 0.1–1.0)
Monocytes Relative: 6.6 % (ref 3.0–12.0)
Neutro Abs: 2.9 10*3/uL (ref 1.4–7.7)
Neutrophils Relative %: 51.6 % (ref 43.0–77.0)
Platelets: 159 10*3/uL (ref 150.0–400.0)
RBC: 5.02 Mil/uL (ref 3.87–5.11)
RDW: 13.4 % (ref 11.5–15.5)
WBC: 5.6 10*3/uL (ref 4.0–10.5)

## 2017-10-19 LAB — BASIC METABOLIC PANEL
BUN: 13 mg/dL (ref 6–23)
CHLORIDE: 104 meq/L (ref 96–112)
CO2: 32 mEq/L (ref 19–32)
CREATININE: 0.86 mg/dL (ref 0.40–1.20)
Calcium: 10 mg/dL (ref 8.4–10.5)
GFR: 86.28 mL/min (ref >60.00)
Glucose, Bld: 151 mg/dL — ABNORMAL HIGH (ref 70–99)
Potassium: 4.7 mEq/L (ref 3.5–5.1)
Sodium: 141 mEq/L (ref 135–145)

## 2017-10-19 LAB — HEPATIC FUNCTION PANEL
ALK PHOS: 62 U/L (ref 39–117)
ALT: 12 U/L (ref 0–35)
AST: 13 U/L (ref 0–37)
Albumin: 4.3 g/dL (ref 3.5–5.2)
BILIRUBIN DIRECT: 0.1 mg/dL (ref 0.0–0.3)
TOTAL PROTEIN: 7.3 g/dL (ref 6.0–8.3)
Total Bilirubin: 0.6 mg/dL (ref 0.2–1.2)

## 2017-10-19 MED ORDER — ESOMEPRAZOLE MAGNESIUM 20 MG PO CPDR: 20.0000 mg | DELAYED_RELEASE_CAPSULE | Freq: Every day | ORAL | 1 refills | Status: DC

## 2017-10-19 MED ORDER — AMLODIPINE BESYLATE 10 MG PO TABS: 10.0000 mg | ORAL_TABLET | Freq: Every day | ORAL | 1 refills | Status: DC

## 2017-10-19 NOTE — Patient Instructions (Signed)
Please complete lab work prior to leaving.   

## 2017-10-19 NOTE — Progress Notes (Signed)
Subjective:    Patient ID: Nancy Deleon, female    DOB: 1956-06-18, 62 y.o.   MRN: [id number]  HPI   Patient is a 62 yr old female who presents today for follow up.  1) anxiety- we gave her an rx for prn hydroxyzine.  Not needing buspar 7.5mg .  Mother will be moved to a new facility in [place] which she is pleased about.   2) GERD- using nexium OTC.    3) Thyroid nodules- saw endocrinology. Multinodular goiter, normal thyroid function, Thyroid biopsy/cytology. Biopsy that was done was on the right inferior intermediate nodule and she had  fine-needle aspiration was done, this is a 2.1 cm solid nodule with TI-RADS score 4 However the cytology was inadequate for diagnosis with scant follicular epithelium noted. Plan at this time is follow up in 6 months for [place] with endo.   4) HTN- maintained on amlodipine. Has been trying to walk more.  BP Readings from Last 3 Encounters:  10/19/17 (!) 122/56  10/01/17 138/84  09/21/17 124/80       Review of Systems    see HPI  Past Medical History:  Diagnosis Date  . Allergy   . Fatty liver 04/03/2014  . GERD (gastroesophageal reflux disease)   . Hypertension   . 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 Left breast mass   . Multiple thyroid nodules 08/20/2017  . Obesity      Social History   Socioeconomic History  . Marital status: Widowed    Spouse name: Not on file  . Number of children: 2  . Years of education: Not on file  . Highest education level: Not on file  Social Needs  . Financial resource strain: Not on file  . Food insecurity - worry: Not on file  . Food insecurity - inability: Not on file  . Transportation needs - medical: Not on file  . Transportation needs - non-medical: Not on file  Occupational History    Employer: [employer]  Tobacco Use  . Smoking status: Former Smoker    Last attempt to quit: 04/18/1985    Years since quitting: 32.5  . Smokeless tobacco: Never Used  Substance and Sexual  Activity  . Alcohol use: Yes    Alcohol/week: 0.0 oz    Comment: occ  . Drug use: No  . Sexual activity: Yes    Partners: Male    Birth control/protection: Post-menopausal    Comment: 1st intercourse 62 yo-Fewer than 5 partners  Other Topics Concern  . Not on file  Social History Narrative   Regular exercise:  Walks 2-5 x weekly   Caffeine Use: no   Widowed   She has a son [name]- he is 77 yrs old.   Foster parent.   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   Dog (Lab named Abby)          Past Surgical History:  Procedure Laterality Date  . BIOPSY THYROID    . BREAST BIOPSY  04/24/11   rigth breast  . BREAST LUMPECTOMY WITH RADIOACTIVE SEED LOCALIZATION Left 05/01/2017   Procedure: LEFT BREAST LUMPECTOMY WITH RADIOACTIVE SEED LOCALIZATION;  Surgeon: Donnie Mesa, MD;  Location: [facility];  Service: General;  Laterality: Left;  . CESAREAN SECTION  [id number]  . COLONOSCOPY    . TUBAL LIGATION    . UPPER GASTROINTESTINAL ENDOSCOPY      Family  History  Problem Relation Age of Onset  . Lymphoma Mother   . Dementia Mother   . Cancer Father        lung  . Colon cancer Maternal Uncle   . Esophageal cancer Neg Hx   . Pancreatic cancer Neg Hx   . Rectal cancer Neg Hx   . Stomach cancer Neg Hx   . Thyroid disease Neg Hx     Allergies  Allergen Reactions  . Naproxen Nausea And Vomiting    REACTION: vomiting  . Ropinirole Nausea Only  . Vicodin [Hydrocodone-Acetaminophen] Nausea And Vomiting  . Propoxyphene N-Acetaminophen Nausea And Vomiting    Current Outpatient Medications on File Prior to Visit  Medication Sig Dispense Refill  . Calcium Carbonate-Vitamin D (CALTRATE 600+D) 600-400 MG-UNIT per tablet Take 1 tablet by mouth 2 (two) times daily.    Marland Kitchen ibuprofen (ADVIL,MOTRIN) 800 MG tablet Take by mouth.    . loratadine (CLARITIN) 10 MG tablet Take 1 tablet (10 mg total) by mouth daily. 30 tablet  2   No current facility-administered medications on file prior to visit.     BP (!) 122/56 (BP Location: Left Arm, Cuff Size: Large)   Pulse 66   Temp 98.4 F (36.9 C) (Oral)   Resp 16   Ht 5\' 8"  (1.727 m)   Wt 236 lb 9.6 oz (107.3 kg)   LMP 02/28/2007   SpO2 93%   BMI 35.97 kg/m    Objective:   Physical Exam  Constitutional: She is oriented to person, place, and time. She appears well-developed and well-nourished.  HENT:  Head: Normocephalic and atraumatic.  Cardiovascular: Normal rate, regular rhythm and normal heart sounds.  No murmur heard. Pulmonary/Chest: Effort normal and breath sounds normal. No respiratory distress. She has no wheezes.  Musculoskeletal: She exhibits no edema.  Neurological: She is alert and oriented to person, place, and time.  Psychiatric: She has a normal mood and affect. Her behavior is normal. Judgment and thought content normal.          Assessment & Plan:  Hypertension- BP stable on amlodipine. Continue same.  GERD- stable on otc nexium, refill sent to mail order at pt request.  Multinodular goiter- management per endo. Clinically stable.   Anxiety- stable off of meds. Monitor.

## 2017-10-21 ENCOUNTER — Encounter: Payer: Self-pay | Admitting: Family

## 2017-10-21 LAB — HIV ANTIBODY (ROUTINE TESTING W REFLEX): HIV 1&2 Ab, 4th Generation: NONREACTIVE

## 2017-10-21 LAB — HEPATITIS C ANTIBODY
Hepatitis C Ab: NONREACTIVE
SIGNAL TO CUT-OFF: 0.01 (ref <1.00)

## 2017-10-24 ENCOUNTER — Telehealth: Payer: Self-pay

## 2017-10-24 MED ORDER — OMEPRAZOLE 20 MG PO CPDR: 20.0000 mg | DELAYED_RELEASE_CAPSULE | Freq: Every day | ORAL | 3 refills | Status: DC

## 2017-10-24 NOTE — Telephone Encounter (Signed)
rx sent to [facility] for omeprazole instead.

## 2017-10-24 NOTE — Telephone Encounter (Signed)
Pt's insurance doesn't cover esomeprazole 20mg  capsules. Preferred are rabeprazole 20mg , omeprazole 10, 20, 40mg , pantoprazole 20mg  and 40mg , prilosec powder packet 2.5mg  and 10mg .

## 2017-10-26 MED ORDER — OMEPRAZOLE 20 MG PO CPDR: 20.0000 mg | DELAYED_RELEASE_CAPSULE | Freq: Every day | ORAL | 1 refills | Status: DC

## 2017-10-26 NOTE — Telephone Encounter (Signed)
Notified pt and she voices understanding. Pt prefers that omeprazole be sent to [facility]. Cancelled rx with [place] at [facility] and re-sent to [facility].

## 2017-10-30 ENCOUNTER — Encounter: Payer: Self-pay | Admitting: Sports Medicine

## 2017-10-30 ENCOUNTER — Ambulatory Visit (INDEPENDENT_AMBULATORY_CARE_PROVIDER_SITE_OTHER): Admitting: Sports Medicine

## 2017-10-30 DIAGNOSIS — L988 Other specified disorders of the skin and subcutaneous tissue: Secondary | ICD-10-CM

## 2017-10-30 DIAGNOSIS — B359 Dermatophytosis, unspecified: Secondary | ICD-10-CM

## 2017-10-30 NOTE — Progress Notes (Signed)
9

## 2017-10-30 NOTE — Progress Notes (Signed)
Subjective: Nancy Deleon is a 62 y.o. female patient seen today in office for med check and f/u on itchness 4-5th toes on the left foot. Reports that her foot is all better/cleared up and wants to know if she should keep using the medication. Denies swelling, drainage, nausea, vomiting, fever, and chills. Patient has no other pedal complaints at this time.   Patient Active Problem List   Diagnosis Date Noted  . Anxiety 09/12/2017  . Multiple thyroid nodules 08/20/2017  . 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 Allergic rhinitis 11/24/2015  . Back pain of thoracolumbar region 08/10/2014  . Renal cyst, left 08/10/2014  . Musculoskeletal pain 05/14/2014  . Fatty liver 04/03/2014  . Abdominal pain, epigastric 04/01/2014  . Second degree AV block, Mobitz type I 03/23/2014  . GERD (gastroesophageal reflux disease) 02/09/2014  . Nonspecific abnormal electrocardiogram (ECG) (EKG) 02/09/2014  . Pain in joint, ankle and foot 11/17/2013  . Headache(784.0) 08/15/2013  . Routine general medical examination at a health care facility 02/21/2012  . Skin rash 12/18/2011  . Seasonal allergies 11/21/2011  . ANEMIA, IRON DEFICIENCY 12/30/2008  . Hyperglycemia 10/31/2007  . Essential hypertension 10/31/2007  . Impaired fasting glucose 10/31/2007    Current Outpatient Medications on File Prior to Visit  Medication Sig Dispense Refill  . amLODipine (NORVASC) 10 MG tablet Take 1 tablet (10 mg total) by mouth daily. 90 tablet 1  . Calcium Carbonate-Vitamin D (CALTRATE 600+D) 600-400 MG-UNIT per tablet Take 1 tablet by mouth 2 (two) times daily.    . clotrimazole (LOTRIMIN) 1 % external solution Apply 1 application topically daily.     Marland Kitchen ibuprofen (ADVIL,MOTRIN) 800 MG tablet Take by mouth.    . loratadine (CLARITIN) 10 MG tablet Take 1 tablet (10 mg total) by mouth daily. 30 tablet 2  . omeprazole (PRILOSEC) 20 MG capsule Take 1 capsule (20 mg total) by mouth daily. 90 capsule 1   No current  facility-administered medications on file prior to visit.     Allergies  Allergen Reactions  . Naproxen Nausea And Vomiting    REACTION: vomiting  . Ropinirole Nausea Only  . Vicodin [Hydrocodone-Acetaminophen] Nausea And Vomiting  . Propoxyphene N-Acetaminophen Nausea And Vomiting    Objective: Physical Exam  General: Well developed, nourished, no acute distress, awake, alert and oriented x 3  Vascular: Dorsalis pedis artery 2/4 bilateral, Posterior tibial artery 2/4 bilateral, skin temperature warm to warm proximal to distal bilateral lower extremities, no varicosities, pedal hair present bilateral.  Neurological: Gross sensation present via light touch bilateral.   Dermatological: Skin is warm, dry, and supple bilateral, Nails 1-10 are short and thick currently asymptomatic, Resolved macerations left 3-4th webspaces, no open lesions present bilateral, no callus/corns/hyperkeratotic tissue present bilateral. No other signs of infection bilateral.  Musculoskeletal: Asymptomatic hammertoe boney deformities noted bilateral. Muscular strength within normal limits without painon range of motion. No pain with calf compression bilateral.  Assessment and Plan:  Problem List Items Addressed This Visit    None    Visit Diagnoses    Tinea    -  Primary   Relevant Medications   clotrimazole (LOTRIMIN) 1 % external solution   Maceration of skin         -Examined patient.  -Discussed long term care for tinea -Advised patient to d/c Clotrimazole solution since better but if recurs to return to using  -Continue with good hygiene habits  -Patient to return as needed or sooner if symptoms worsen.  Landis Martins,  DPM

## 2017-12-24 ENCOUNTER — Telehealth: Payer: Self-pay

## 2017-12-24 ENCOUNTER — Encounter: Payer: Self-pay | Admitting: Family

## 2017-12-24 ENCOUNTER — Ambulatory Visit (INDEPENDENT_AMBULATORY_CARE_PROVIDER_SITE_OTHER): Admitting: Family

## 2017-12-24 ENCOUNTER — Other Ambulatory Visit: Payer: Self-pay

## 2017-12-24 VITALS — BP 127/71 | HR 63 | Temp 99.0°F | Resp 16 | Ht 68.0 in | Wt 234.0 lb

## 2017-12-24 DIAGNOSIS — R0789 Other chest pain: Secondary | ICD-10-CM

## 2017-12-24 DIAGNOSIS — D369 Benign neoplasm, unspecified site: Secondary | ICD-10-CM

## 2017-12-24 MED ORDER — PIMECROLIMUS 1 % EX CREA: TOPICAL_CREAM | Freq: Two times a day (BID) | CUTANEOUS | 0 refills | Status: DC

## 2017-12-24 MED ORDER — CRISABOROLE 2 % EX OINT: 1.0000 "application " | TOPICAL_OINTMENT | Freq: Two times a day (BID) | CUTANEOUS | 0 refills | Status: DC

## 2017-12-24 NOTE — Patient Instructions (Signed)
You may begin eucrisa ointment (thin layer twice daily to both eyelids). You should be contacted about your appointment for mammogram/breast ultrasound.  Please let me know if you have not been contacted about this appointment in 1 week. Please go to the ER if you develop severe/worsening chest pain or shortness of breath.

## 2017-12-24 NOTE — Telephone Encounter (Signed)
Eucrisa 2% ointment not covered. Preferred alternative is betamethasone, desoximetasone, fluocinonide, triamcinolone, tacrolimus, or Elidel.

## 2017-12-24 NOTE — Progress Notes (Signed)
Subjective:    Patient ID: Nancy Deleon, female    DOB: Mar 30, 1956, 62 y.o.   MRN: [id number]  HPI  Nancy Deleon is a 62 yr old female who presents today for follow up.  1) Breast pain- reports pain left breast for 2 weeks. Of note she had a left radioactive seed localized lumpectomy of the left breast 9/18 due to intraductal papilloma. This was performed by Dr. Georgette Dover.  She reports a cramping pain.    2) Skin rash-reports + rash on bilateral eyelids.Has been present x 2 weeks.  Reports no improvement in her symptoms over this time. She reports + hx of eczema.   Review of Systems    see HPI  Past Medical History:  Diagnosis Date  . Allergy   . Fatty liver 04/03/2014  . GERD (gastroesophageal reflux disease)   . Hypertension   . 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 Left breast mass   . Multiple thyroid nodules 08/20/2017  . Obesity      Social History   Socioeconomic History  . Marital status: Widowed    Spouse name: Not on file  . Number of children: 2  . Years of education: Not on file  . Highest education level: Not on file  Occupational History    Employer: [employer]  Social Needs  . Financial resource strain: Not on file  . Food insecurity:    Worry: Not on file    Inability: Not on file  . Transportation needs:    Medical: Not on file    Non-medical: Not on file  Tobacco Use  . Smoking status: Former Smoker    Last attempt to quit: 04/18/1985    Years since quitting: 32.7  . Smokeless tobacco: Never Used  Substance and Sexual Activity  . Alcohol use: Yes    Alcohol/week: 0.0 oz    Comment: occ  . Drug use: No  . Sexual activity: Yes    Partners: Male    Birth control/protection: Post-menopausal    Comment: 1st intercourse 62 yo-Fewer than 5 partners  Lifestyle  . Physical activity:    Days per week: Not on file    Minutes per session: Not on file  . Stress: Not on file  Relationships  . Social connections:    Talks on phone: Not  on file    Gets together: Not on file    Attends religious service: Not on file    Active member of club or organization: Not on file    Attends meetings of clubs or organizations: Not on file    Relationship status: Not on file  . Intimate partner violence:    Fear of current or ex partner: Not on file    Emotionally abused: Not on file    Physically abused: Not on file    Forced sexual activity: Not on file  Other Topics Concern  . Not on file  Social History Narrative   Regular exercise:  Walks 2-5 x weekly   Caffeine Use: no   Widowed   She has a son [name]- he is 70 yrs old.   Foster parent.   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   Dog (Lab named Abby)          Past Surgical History:  Procedure Laterality Date  . BIOPSY THYROID    . BREAST BIOPSY  04/24/11   rigth  breast  . BREAST LUMPECTOMY WITH RADIOACTIVE SEED LOCALIZATION Left 05/01/2017   Procedure: LEFT BREAST LUMPECTOMY WITH RADIOACTIVE SEED LOCALIZATION;  Surgeon: Donnie Mesa, MD;  Location: [facility];  Service: General;  Laterality: Left;  . CESAREAN SECTION  [id number]  . COLONOSCOPY    . TUBAL LIGATION    . UPPER GASTROINTESTINAL ENDOSCOPY      Family History  Problem Relation Age of Onset  . Lymphoma Mother   . Dementia Mother   . Cancer Father        lung  . Colon cancer Maternal Uncle   . Esophageal cancer Neg Hx   . Pancreatic cancer Neg Hx   . Rectal cancer Neg Hx   . Stomach cancer Neg Hx   . Thyroid disease Neg Hx     Allergies  Allergen Reactions  . Naproxen Nausea And Vomiting    REACTION: vomiting  . Ropinirole Nausea Only  . Vicodin [Hydrocodone-Acetaminophen] Nausea And Vomiting  . Propoxyphene N-Acetaminophen Nausea And Vomiting    Current Outpatient Medications on File Prior to Visit  Medication Sig Dispense Refill  . amLODipine (NORVASC) 10 MG tablet Take 1 tablet (10 mg total) by mouth daily. 90  tablet 1  . Calcium Carbonate-Vitamin D (CALTRATE 600+D) 600-400 MG-UNIT per tablet Take 1 tablet by mouth 2 (two) times daily.    . clotrimazole (LOTRIMIN) 1 % external solution Apply 1 application topically daily.     Marland Kitchen ibuprofen (ADVIL,MOTRIN) 800 MG tablet Take by mouth.    . loratadine (CLARITIN) 10 MG tablet Take 1 tablet (10 mg total) by mouth daily. 30 tablet 2  . omeprazole (PRILOSEC) 20 MG capsule Take 1 capsule (20 mg total) by mouth daily. 90 capsule 1   No current facility-administered medications on file prior to visit.     BP 127/71 (BP Location: Right Arm, Patient Position: Sitting, Cuff Size: Large)   Pulse 63   Temp 99 F (37.2 C) (Oral)   Resp 16   Ht 5\' 8"  (1.727 m)   Wt 234 lb (106.1 kg)   LMP 02/28/2007   SpO2 100%   BMI 35.58 kg/m    Objective:   Physical Exam  Constitutional: She is oriented to person, place, and time. She appears well-developed and well-nourished.  Cardiovascular: Normal rate, regular rhythm and normal heart sounds.  No murmur heard. Pulmonary/Chest: Effort normal and breath sounds normal. No respiratory distress. She has no wheezes.  Neurological: She is alert and oriented to person, place, and time.  Skin: Skin is warm and dry.  + eczematous rash noted bilateral upper/inner eyelids  Psychiatric: She has a normal mood and affect. Her behavior is normal. Judgment and thought content normal.  Breast- bilateral breast exam is normal without masses. She does have some tenderness to palpation in the left breast between 1-3 oclock.         Assessment & Plan:  Eczema-new. Will avoid steroid use near eyes. Will rx with eucrisa.  Hx of intraductal papilloma- now with Mastalgia/atypical chest pain- Given her hx will obtain follow up breast imaging, (diagnostic mammo and [place]).EKG tracing is personally reviewed.  EKG notes NSR.  No acute changes.  She is advised to go to the ER if new/worsening chest pain or if she develops sob. She verbalizes  understanding.

## 2017-12-24 NOTE — Telephone Encounter (Signed)
rx sent for Elidel instead.

## 2017-12-24 NOTE — Telephone Encounter (Signed)
Notified pt and she voices understanding. 

## 2017-12-28 ENCOUNTER — Encounter: Payer: Self-pay | Admitting: Family

## 2018-01-07 ENCOUNTER — Ambulatory Visit
Admission: RE | Admit: 2018-01-07 | Discharge: 2018-01-07 | Disposition: A | Discharge status: Home / Self Care | Source: Ambulatory Visit | Attending: Family | Admitting: Family

## 2018-01-07 DIAGNOSIS — D369 Benign neoplasm, unspecified site: Secondary | ICD-10-CM

## 2018-01-21 ENCOUNTER — Encounter: Payer: Self-pay | Admitting: Family

## 2018-01-21 ENCOUNTER — Ambulatory Visit (INDEPENDENT_AMBULATORY_CARE_PROVIDER_SITE_OTHER): Admitting: Family

## 2018-01-21 VITALS — BP 134/64 | HR 69 | Temp 98.4°F | Resp 18 | Ht 68.0 in | Wt 236.2 lb

## 2018-01-21 DIAGNOSIS — N644 Mastodynia: Secondary | ICD-10-CM

## 2018-01-21 DIAGNOSIS — R21 Rash and other nonspecific skin eruption: Secondary | ICD-10-CM | POA: Diagnosis not present

## 2018-01-21 DIAGNOSIS — K219 Gastro-esophageal reflux disease without esophagitis: Secondary | ICD-10-CM

## 2018-01-21 MED ORDER — OMEPRAZOLE 40 MG PO CPDR: 40.0000 mg | DELAYED_RELEASE_CAPSULE | Freq: Every day | ORAL | 1 refills | Status: DC

## 2018-01-21 NOTE — Patient Instructions (Signed)
You should be contacted about your referral to dermatology.  

## 2018-01-21 NOTE — Progress Notes (Signed)
Subjective:    Patient ID: Nancy Deleon, female    DOB: Jul 26, 1956, 62 y.o.   MRN: [id number]  HPI   Breast pain- resolved    GERD-  Taking omeprazole 20mg , not helping. Insurance would not cover nexium.   Skin rash- above eyelids.  Using Elidel without improvement.     Review of Systems See HPI  Past Medical History:  Diagnosis Date  . Allergy   . Fatty liver 04/03/2014  . GERD (gastroesophageal reflux disease)   . Hypertension   . 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 Left breast mass   . Multiple thyroid nodules 08/20/2017  . Obesity      Social History   Socioeconomic History  . Marital status: Widowed    Spouse name: Not on file  . Number of children: 2  . Years of education: Not on file  . Highest education level: Not on file  Occupational History    Employer: [employer]  Social Needs  . Financial resource strain: Not on file  . Food insecurity:    Worry: Not on file    Inability: Not on file  . Transportation needs:    Medical: Not on file    Non-medical: Not on file  Tobacco Use  . Smoking status: Former Smoker    Last attempt to quit: 04/18/1985    Years since quitting: 32.7  . Smokeless tobacco: Never Used  Substance and Sexual Activity  . Alcohol use: Yes    Alcohol/week: 0.0 oz    Comment: occasional wine  . Drug use: No  . Sexual activity: Yes    Partners: Male    Birth control/protection: Post-menopausal    Comment: 1st intercourse 62 yo-Fewer than 5 partners  Lifestyle  . Physical activity:    Days per week: Not on file    Minutes per session: Not on file  . Stress: Not on file  Relationships  . Social connections:    Talks on phone: Not on file    Gets together: Not on file    Attends religious service: Not on file    Active member of club or organization: Not on file    Attends meetings of clubs or organizations: Not on file    Relationship status: Not on file  . Intimate partner violence:    Fear of current  or ex partner: Not on file    Emotionally abused: Not on file    Physically abused: Not on file    Forced sexual activity: Not on file  Other Topics Concern  . Not on file  Social History Narrative   Regular exercise:  Walks 2-5 x weekly   Caffeine Use: no   Widowed   She has a son [name]- he is 19 yrs old.   Foster parent.   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   Dog (Lab named Abby)          Past Surgical History:  Procedure Laterality Date  . BIOPSY THYROID    . BREAST BIOPSY  04/24/11   rigth breast  . BREAST LUMPECTOMY WITH RADIOACTIVE SEED LOCALIZATION Left 05/01/2017   Procedure: LEFT BREAST LUMPECTOMY WITH RADIOACTIVE SEED LOCALIZATION;  Surgeon: Donnie Mesa, MD;  Location: [facility];  Service: General;  Laterality: Left;  . CESAREAN SECTION  [id number]  . COLONOSCOPY    . TUBAL LIGATION    . UPPER  GASTROINTESTINAL ENDOSCOPY      Family History  Problem Relation Age of Onset  . Lymphoma Mother   . Dementia Mother   . Cancer Father        lung  . Colon cancer Maternal Uncle   . Esophageal cancer Neg Hx   . Pancreatic cancer Neg Hx   . Rectal cancer Neg Hx   . Stomach cancer Neg Hx   . Thyroid disease Neg Hx     Allergies  Allergen Reactions  . Naproxen Nausea And Vomiting    REACTION: vomiting  . Ropinirole Nausea Only  . Vicodin [Hydrocodone-Acetaminophen] Nausea And Vomiting  . Propoxyphene N-Acetaminophen Nausea And Vomiting    Current Outpatient Medications on File Prior to Visit  Medication Sig Dispense Refill  . amLODipine (NORVASC) 10 MG tablet Take 1 tablet (10 mg total) by mouth daily. 90 tablet 1  . Calcium Carbonate-Vitamin D (CALTRATE 600+D) 600-400 MG-UNIT per tablet Take 1 tablet by mouth 2 (two) times daily.    Marland Kitchen loratadine (CLARITIN) 10 MG tablet Take 1 tablet (10 mg total) by mouth daily. (Patient taking differently: Take 10 mg by mouth daily as needed. ) 30  tablet 2  . pimecrolimus (ELIDEL) 1 % cream Apply topically 2 (two) times daily. 30 g 0   No current facility-administered medications on file prior to visit.     BP 134/64 (BP Location: Right Arm, Cuff Size: Large)   Pulse 69   Temp 98.4 F (36.9 C) (Oral)   Resp 18   Ht 5\' 8"  (1.727 m)   Wt 236 lb 3.2 oz (107.1 kg)   LMP 02/28/2007   SpO2 100%   BMI 35.91 kg/m       Objective:   Physical Exam  Constitutional: She appears well-developed and well-nourished.  Cardiovascular: Normal rate, regular rhythm and normal heart sounds.  No murmur heard. Pulmonary/Chest: Effort normal and breath sounds normal. No respiratory distress. She has no wheezes.  Psychiatric: She has a normal mood and affect. Her behavior is normal. Judgment and thought content normal.  Skin: dry skin noted bilateral upper lids        Assessment & Plan:  Mastalgia- resolved.  Eczema of eyelids- unchanged despite use of Elidel, will refer to dermatology.   Gerd- uncontrolled on omeprazole 20 mg. Will increase to 40 mg once daily.

## 2018-01-23 ENCOUNTER — Telehealth: Payer: Self-pay | Admitting: Family

## 2018-01-23 NOTE — Telephone Encounter (Signed)
Copied from [place] [id number]. Topic: Quick Communication - See Telephone Encounter >> Jan 23, 2018  2:07 PM Boyd Kerbs wrote: CRM for notification. See Telephone encounter for: 01/23/18.  She can not get into [facility] [place] [facility]. Oct.  And asking if can be referral to someone else that could get in sooner.

## 2018-01-25 NOTE — Telephone Encounter (Signed)
Gwen -- can you fax this referral to someone else. Pt said she is ok if referral is to someone in [place] if she can be seen earlier?

## 2018-01-25 NOTE — Telephone Encounter (Signed)
Sent to [facility]

## 2018-02-13 ENCOUNTER — Other Ambulatory Visit: Payer: Self-pay | Admitting: Family

## 2018-02-13 DIAGNOSIS — Z1231 Encounter for screening mammogram for malignant neoplasm of breast: Secondary | ICD-10-CM

## 2018-02-28 IMAGING — CT CT HEAD W/O CM
3 series · 15 of 47 positions shown, 18 images · non-contrast
Comparison: None.

CLINICAL DATA: Pt co left side facial numbness happened once on
[REDACTED] and once on [REDACTED], denies injury

EXAM:
CT HEAD WITHOUT CONTRAST
TECHNIQUE: Contiguous axial images were obtained from the base of the skull
through the vertex without intravenous contrast.

[Series 2: head wo · axial · 0.46mm/px · z∈[-194,-58]mm · 9 of 33 slices shown, 12 images]
[im 3/33  brain]
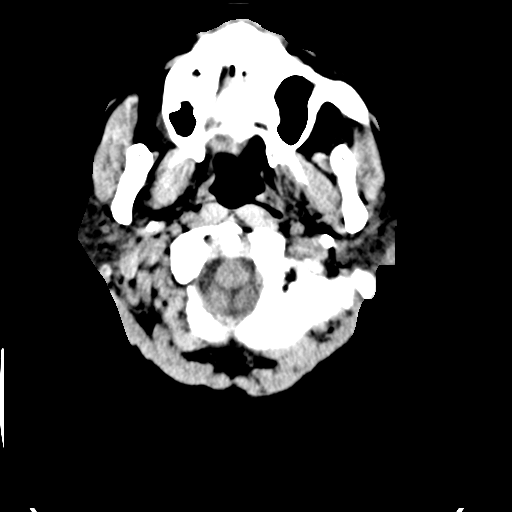
[im 3/33  bone]
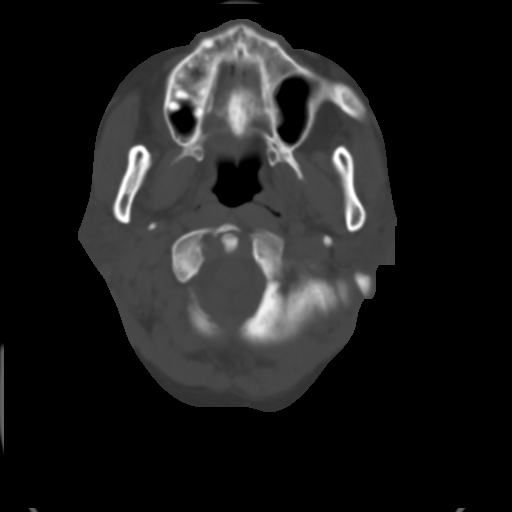
[im 6/33  brain]
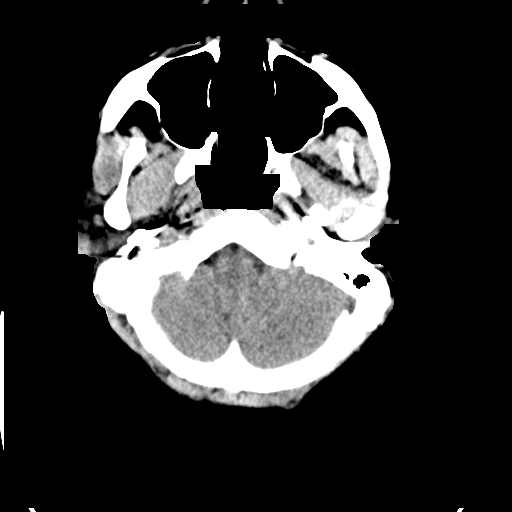
[im 9/33  brain]
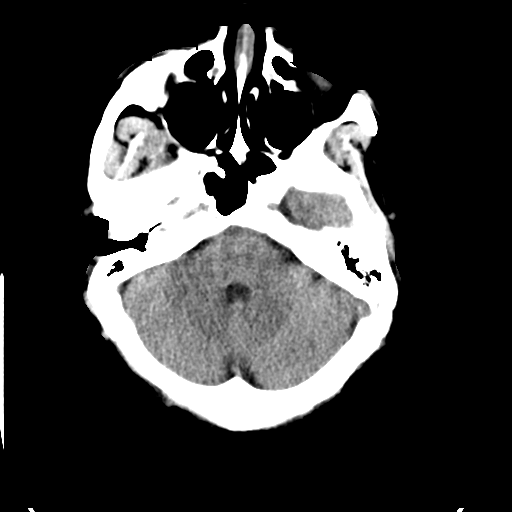
[im 13/33  brain]
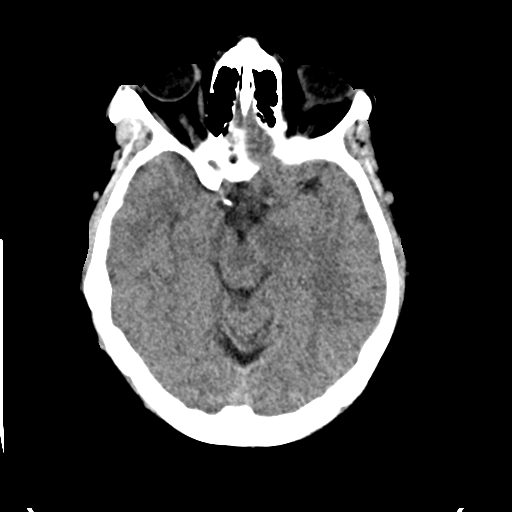
[im 17/33  brain]
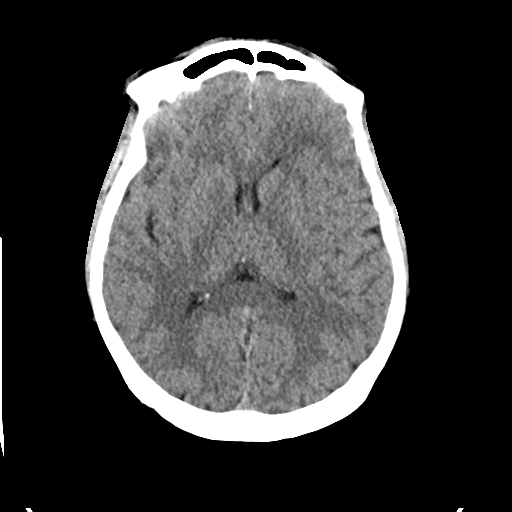
[im 17/33  bone]
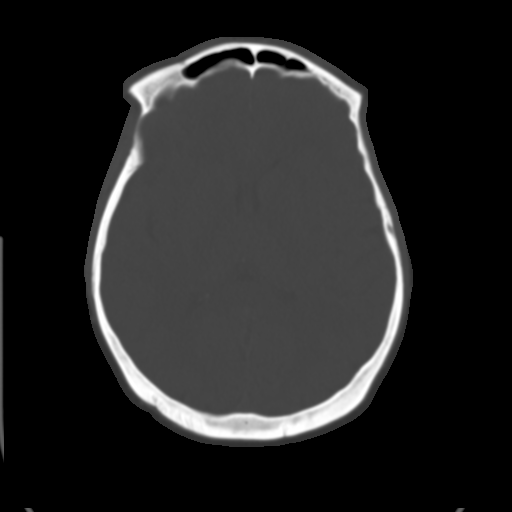
[im 20/33  brain]
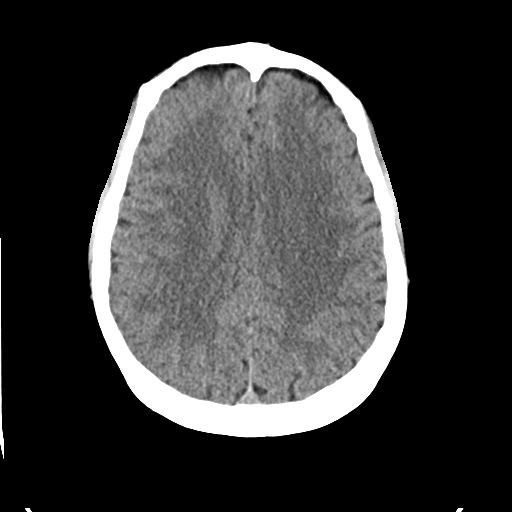
[im 24/33  brain]
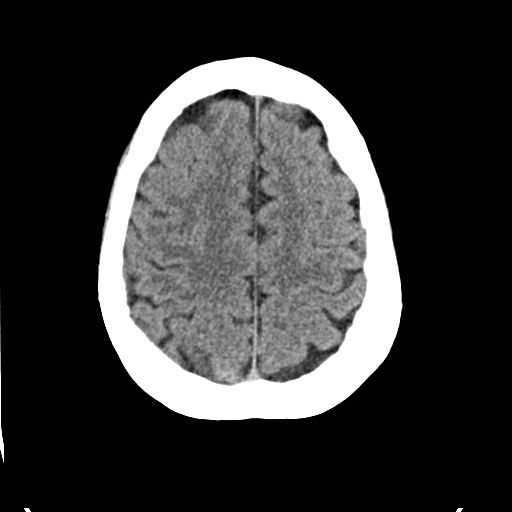
[im 27/33  brain]
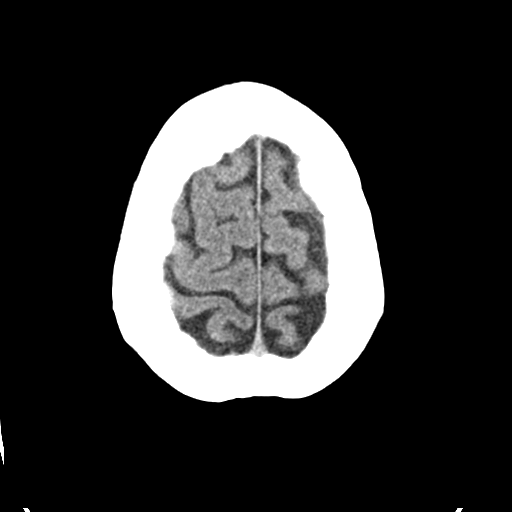
[im 30/33  brain]
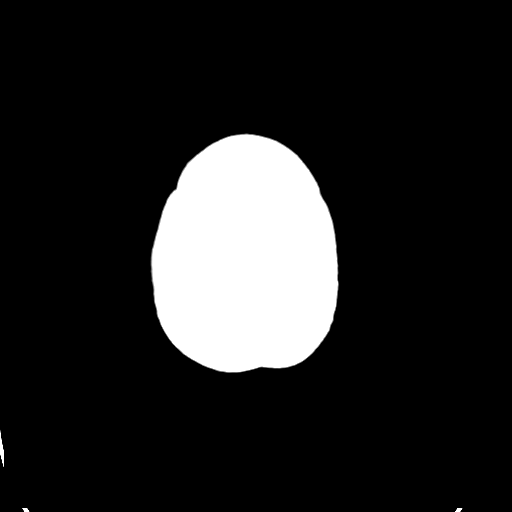
[im 30/33  bone]
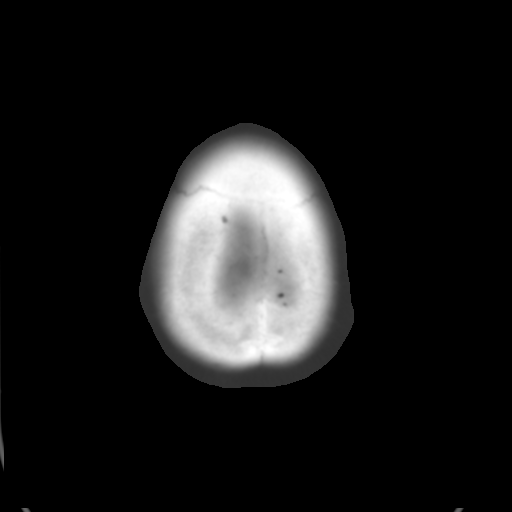

[Series 4: coronal soft · coronal · 0.37mm/px · 3 of 70 slices shown]
[im 24/70  brain]
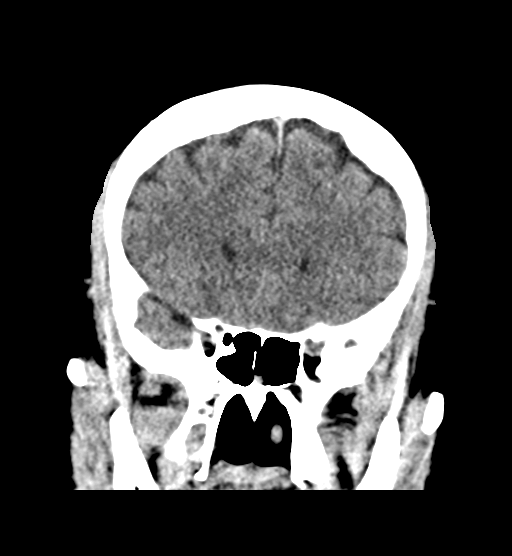
[im 31/70  brain]
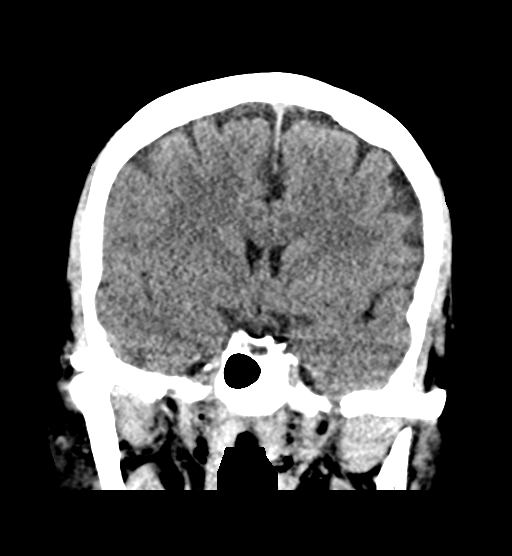
[im 39/70  brain]
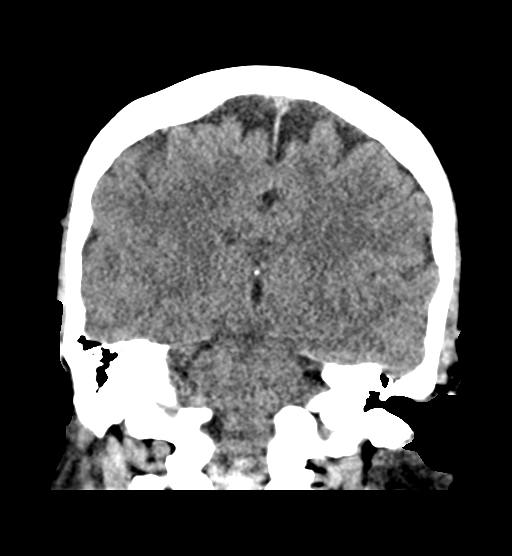

[Series 5: sag soft · sagittal · 0.33mm/px · 3 of 57 slices shown]
[im 19/57  brain]
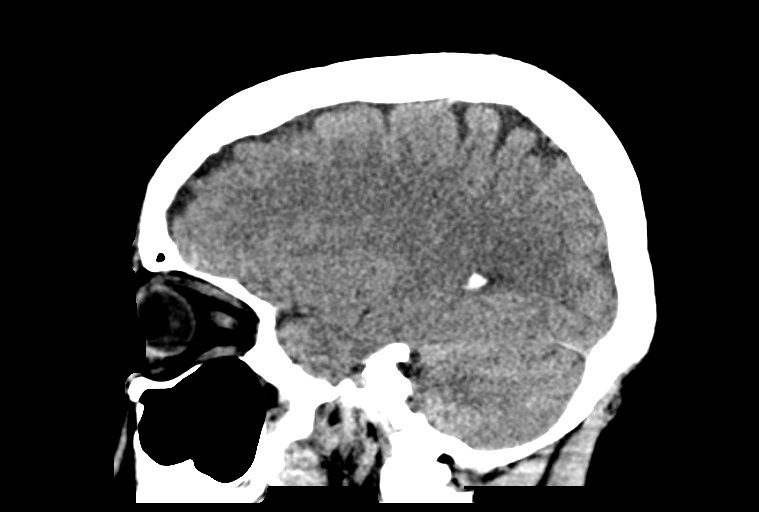
[im 29/57  brain]
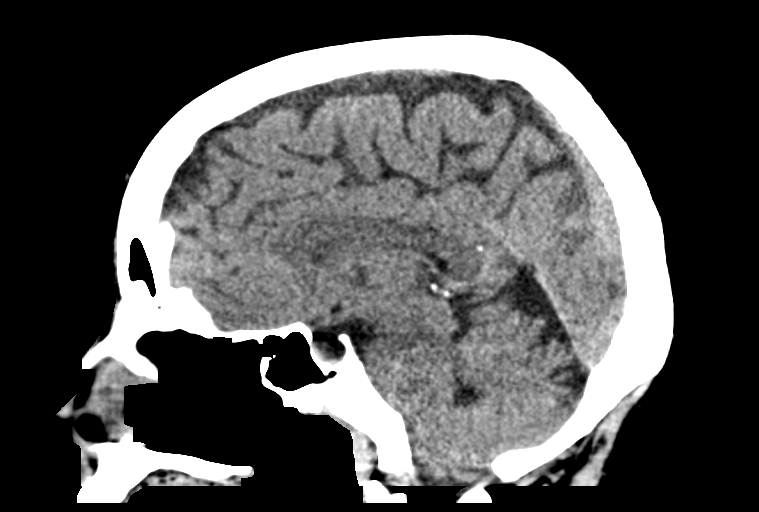
[im 38/57  brain]
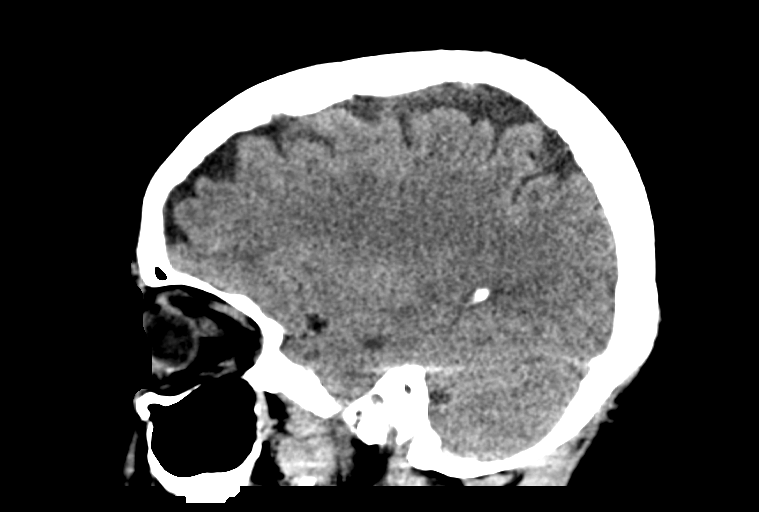

[15 of 47 positions shown; findings below may reference images not displayed]

FINDINGS: Brain: Ventricles are normal in size and configuration. All areas of
the brain demonstrate normal gray-white matter attenuation. There is
no mass, hemorrhage, edema or other evidence of acute parenchymal
abnormality. No extra-axial hemorrhage.

Vascular: No hyperdense vessel or unexpected calcification.

Skull: Normal. Negative for fracture or focal lesion.

Sinuses/Orbits: No acute finding.

Other: None.
IMPRESSION: Normal head CT.

## 2018-03-12 ENCOUNTER — Ambulatory Visit: Admitting: Family

## 2018-03-13 ENCOUNTER — Encounter: Payer: Self-pay | Admitting: Family

## 2018-03-13 ENCOUNTER — Ambulatory Visit (INDEPENDENT_AMBULATORY_CARE_PROVIDER_SITE_OTHER): Admitting: Family

## 2018-03-13 VITALS — BP 128/60 | HR 63 | Temp 98.6°F | Resp 16 | Ht 68.0 in | Wt 234.6 lb

## 2018-03-13 DIAGNOSIS — E042 Nontoxic multinodular goiter: Secondary | ICD-10-CM | POA: Diagnosis not present

## 2018-03-13 DIAGNOSIS — E1129 Type 2 diabetes mellitus with other diabetic kidney complication: Secondary | ICD-10-CM

## 2018-03-13 DIAGNOSIS — L309 Dermatitis, unspecified: Secondary | ICD-10-CM

## 2018-03-13 DIAGNOSIS — R739 Hyperglycemia, unspecified: Secondary | ICD-10-CM | POA: Diagnosis not present

## 2018-03-13 DIAGNOSIS — K219 Gastro-esophageal reflux disease without esophagitis: Secondary | ICD-10-CM

## 2018-03-13 DIAGNOSIS — M545 Low back pain: Secondary | ICD-10-CM | POA: Diagnosis not present

## 2018-03-13 DIAGNOSIS — I10 Essential (primary) hypertension: Secondary | ICD-10-CM | POA: Diagnosis not present

## 2018-03-13 HISTORY — DX: Type 2 diabetes mellitus with other diabetic kidney complication: E11.29

## 2018-03-13 LAB — BASIC METABOLIC PANEL
BUN: 16 mg/dL (ref 6–23)
CALCIUM: 9.7 mg/dL (ref 8.4–10.5)
CO2: 32 mEq/L (ref 19–32)
Chloride: 105 mEq/L (ref 96–112)
Creatinine, Ser: 0.89 mg/dL (ref 0.40–1.20)
GFR: 82.83 mL/min (ref >60.00)
Glucose, Bld: 165 mg/dL — ABNORMAL HIGH (ref 70–99)
Potassium: 4.3 mEq/L (ref 3.5–5.1)
SODIUM: 140 meq/L (ref 135–145)

## 2018-03-13 LAB — HEMOGLOBIN A1C: HEMOGLOBIN A1C: 6.9 % — AB (ref 4.6–6.5)

## 2018-03-13 MED ORDER — AMLODIPINE BESYLATE 10 MG PO TABS
10.0000 mg | ORAL_TABLET | Freq: Every day | ORAL | 1 refills | Status: DC
Start: 2018-03-13 — End: 2018-11-06

## 2018-03-13 MED ORDER — ESOMEPRAZOLE MAGNESIUM 20 MG PO TBEC: 1.0000 | DELAYED_RELEASE_TABLET | Freq: Every day | ORAL | Status: DC

## 2018-03-13 MED ORDER — MELOXICAM 7.5 MG PO TABS: 7.5000 mg | ORAL_TABLET | Freq: Every day | ORAL | 0 refills | Status: DC | PRN

## 2018-03-13 NOTE — Progress Notes (Signed)
Subjective:    Patient ID: Nancy Deleon, female    DOB: 06-11-1956, 62 y.o.   MRN: [id number]  HPI   Patient is a 62 yr old female who presents today for follow up:  Hyperglycemia-  Lab Results  Component Value Date   HGBA1C 5.8 (H) 08/25/2016   GERD- using OTC nexium in place of rx omeprazole 40mg .  Reports that otc nexium works.   HTN-maintained on amlodipine 10mg  once daily.   BP Readings from Last 3 Encounters:  03/13/18 128/60  01/21/18 134/64  12/24/17 127/71     Thyroid nodules- Had biopsy back in February. Results noted:  Scant follicular epithelium.  Bethesda category 1.  She saw Dr. Dwyane Dee (endo) back in February and he recommended that she be reexamined in 6 months and consider follow-up ultrasound at that time rather than repeat biopsy. She is scheduled to see him later this month.  Lab Results  Component Value Date   TSH 1.14 08/21/2017   Low back pain- present x 1 month. Seeing chiropractor and requesting rx for muscle relaxer. Reports that she has intermittent low back pain.   Rash- using elidel on he eyelid.  Derm was booking out too far so she did not schedule with them. This is helping.    Review of Systems See HPI  Past Medical History:  Diagnosis Date  . Allergy   . Fatty liver 04/03/2014  . GERD (gastroesophageal reflux disease)   . Hypertension   . 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 Left breast mass   . Multiple thyroid nodules 08/20/2017  . Obesity      Social History   Socioeconomic History  . Marital status: Widowed    Spouse name: Not on file  . Number of children: 2  . Years of education: Not on file  . Highest education level: Not on file  Occupational History    Employer: [employer]  Social Needs  . Financial resource strain: Not on file  . Food insecurity:    Worry: Not on file    Inability: Not on file  . Transportation needs:    Medical: Not on file    Non-medical: Not on file  Tobacco Use  .  Smoking status: Former Smoker    Last attempt to quit: 04/18/1985    Years since quitting: 32.9  . Smokeless tobacco: Never Used  Substance and Sexual Activity  . Alcohol use: Yes    Alcohol/week: 0.0 oz    Comment: occasional wine  . Drug use: No  . Sexual activity: Yes    Partners: Male    Birth control/protection: Post-menopausal    Comment: 1st intercourse 62 yo-Fewer than 5 partners  Lifestyle  . Physical activity:    Days per week: Not on file    Minutes per session: Not on file  . Stress: Not on file  Relationships  . Social connections:    Talks on phone: Not on file    Gets together: Not on file    Attends religious service: Not on file    Active member of club or organization: Not on file    Attends meetings of clubs or organizations: Not on file    Relationship status: Not on file  . Intimate partner violence:    Fear of current or ex partner: Not on file    Emotionally abused: Not on file    Physically abused: Not on file    Forced sexual activity: Not on file  Other Topics Concern  . Not on file  Social History Narrative   Regular exercise:  Walks 2-5 x weekly   Caffeine Use: no   Widowed   She has a son [name]- he is 17 yrs old.   Foster parent.   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   Dog (Lab named Abby)          Past Surgical History:  Procedure Laterality Date  . BIOPSY THYROID    . BREAST BIOPSY  04/24/11   rigth breast  . BREAST LUMPECTOMY WITH RADIOACTIVE SEED LOCALIZATION Left 05/01/2017   Procedure: LEFT BREAST LUMPECTOMY WITH RADIOACTIVE SEED LOCALIZATION;  Surgeon: Donnie Mesa, MD;  Location: [name];  Service: General;  Laterality: Left;  . CESAREAN SECTION  [id number]  . COLONOSCOPY    . TUBAL LIGATION    . UPPER GASTROINTESTINAL ENDOSCOPY      Family History  Problem Relation Age of Onset  . Lymphoma Mother   . Dementia Mother   . Cancer Father         lung  . Colon cancer Maternal Uncle   . Esophageal cancer Neg Hx   . Pancreatic cancer Neg Hx   . Rectal cancer Neg Hx   . Stomach cancer Neg Hx   . Thyroid disease Neg Hx     Allergies  Allergen Reactions  . Naproxen Nausea And Vomiting    REACTION: vomiting  . Ropinirole Nausea Only  . Vicodin [Hydrocodone-Acetaminophen] Nausea And Vomiting  . Propoxyphene N-Acetaminophen Nausea And Vomiting    Current Outpatient Medications on File Prior to Visit  Medication Sig Dispense Refill  . amLODipine (NORVASC) 10 MG tablet Take 1 tablet (10 mg total) by mouth daily. 90 tablet 1  . Calcium Carbonate-Vitamin D (CALTRATE 600+D) 600-400 MG-UNIT per tablet Take 1 tablet by mouth 2 (two) times daily.    Marland Kitchen loratadine (CLARITIN) 10 MG tablet Take 1 tablet (10 mg total) by mouth daily. (Patient taking differently: Take 10 mg by mouth daily as needed. ) 30 tablet 2  . omeprazole (PRILOSEC) 40 MG capsule Take 1 capsule (40 mg total) by mouth daily. 90 capsule 1  . pimecrolimus (ELIDEL) 1 % cream Apply topically 2 (two) times daily. 30 g 0   No current facility-administered medications on file prior to visit.     BP 128/60 (BP Location: Right Arm, Cuff Size: Large)   Pulse 63   Temp 98.6 F (37 C) (Oral)   Resp 16   Ht 5\' 8"  (1.727 m)   Wt 234 lb 9.6 oz (106.4 kg)   LMP 02/28/2007   SpO2 99%   BMI 35.67 kg/m       Objective:   Physical Exam  Constitutional: She is oriented to person, place, and time. She appears well-developed and well-nourished.  Neck: Thyromegaly present.  Cardiovascular: Normal rate, regular rhythm and normal heart sounds.  No murmur heard. Pulmonary/Chest: Effort normal and breath sounds normal. No respiratory distress. She has no wheezes.  Musculoskeletal: She exhibits no edema.  Neurological: She is alert and oriented to person, place, and time.  Skin: Skin is warm and dry.  Resolution of rash on eyelids  Psychiatric: She has a normal mood and affect. Her  behavior is normal. Judgment and thought content normal.          Assessment & Plan:  Hyperglycemia-will obtain follow-up A1c.  Thyromegaly-reminded patient to  keep her upcoming appointment with endocrinology.  Hypertension-blood pressure stable on amlodipine.  Continue same.  Eczema-stable/improved with Elidel.  Continue same.  Low back pain-advised patient that she can try meloxicam as needed.  She reports that she can tolerate ibuprofen without any GI side effects.  She continues chiropractic therapy on a monthly basis.  GERD-stable on over-the-counter Nexium.  This is not on her formulary.  She declines trial of Protonix which is nonformulary.  Continue OTC Nexium.

## 2018-03-13 NOTE — Patient Instructions (Addendum)
Please complete lab work prior to leaving.   

## 2018-04-01 ENCOUNTER — Ambulatory Visit
Admission: RE | Admit: 2018-04-01 | Discharge: 2018-04-01 | Disposition: A | Discharge status: Home / Self Care | Source: Ambulatory Visit | Attending: Family | Admitting: Family

## 2018-04-01 DIAGNOSIS — Z1231 Encounter for screening mammogram for malignant neoplasm of breast: Secondary | ICD-10-CM

## 2018-04-01 NOTE — Progress Notes (Deleted)
Patient ID: Nancy Deleon, female   DOB: 08-23-1955, 62 y.o.   MRN: [id number]           Reason for Appointment: Goiter, new consultation    History of Present Illness:   Patient has been referred by her PCP [name]  The patient's thyroid enlargement was first discovered in 08/2017 At that time she was being seen by her PCP for general follow-up and respiratory infection She was found to have a thyroid enlargement on the right side and referred for an ultrasound  She has had no difficulty with swallowing   Does not feel like she has any choking sensation in her neck or pressure in any position or when lying down. Has not previously been told to have a swelling in her thyroid  Lab Results  Component Value Date   FREET4 0.77 08/21/2017   TSH 1.14 08/21/2017   TSH 0.66 02/16/2016   TSH 0.895 02/19/2013    She has had an ultrasound exam in 08/2017 which shows thyroid enlargement bilaterally, left lobe measuring 6.5 cm Also she was estimated to have 5 nodules over 1 cm Biopsy was recommended for 3 of the nodules but on follow-up exam it was felt that 2 of them were pseudo-nodules  Biopsy that was done was on the right inferior intermediate nodule and she had 4 fine-needle aspiration was done, this is a 2.1 cm solid nodule with TI-RADS score 4  However the cytology was inadequate for diagnosis with scant follicular epithelial  She is now referred here for further management    Allergies as of 04/02/2018      Reactions   Naproxen Nausea And Vomiting   REACTION: vomiting   Ropinirole Nausea Only   Vicodin [hydrocodone-acetaminophen] Nausea And Vomiting   Propoxyphene N-acetaminophen Nausea And Vomiting      Medication List        Accurate as of 04/01/18  9:37 PM. Always use your most recent med list.          amLODipine 10 MG tablet Commonly known as:  NORVASC Take 1 tablet (10 mg total) by mouth daily.   CALTRATE 600+D 600-400 MG-UNIT tablet Generic  drug:  Calcium Carbonate-Vitamin D Take 1 tablet by mouth 2 (two) times daily.   Esomeprazole Magnesium 20 MG Tbec Take 1 tablet by mouth daily.   loratadine 10 MG tablet Commonly known as:  CLARITIN Take 1 tablet (10 mg total) by mouth daily.   meloxicam 7.5 MG tablet Commonly known as:  MOBIC Take 1 tablet (7.5 mg total) by mouth daily as needed for pain. For back pain   pimecrolimus 1 % cream Commonly known as:  ELIDEL Apply topically 2 (two) times daily.       Allergies:  Allergies  Allergen Reactions  . Naproxen Nausea And Vomiting    REACTION: vomiting  . Ropinirole Nausea Only  . Vicodin [Hydrocodone-Acetaminophen] Nausea And Vomiting  . Propoxyphene N-Acetaminophen Nausea And Vomiting    Past Medical History:  Diagnosis Date  . Allergy   . Fatty liver 04/03/2014  . GERD (gastroesophageal reflux disease)   . Hypertension   . 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 Left breast mass   . Multiple thyroid nodules 08/20/2017  . Obesity   . Type 2 diabetes mellitus without complications ([place]) 0/03/6577    Past Surgical History:  Procedure Laterality Date  . BIOPSY THYROID    . BREAST BIOPSY  04/24/11   rigth breast  . BREAST EXCISIONAL BIOPSY    .  BREAST LUMPECTOMY WITH RADIOACTIVE SEED LOCALIZATION Left 05/01/2017   Procedure: LEFT BREAST LUMPECTOMY WITH RADIOACTIVE SEED LOCALIZATION;  Surgeon: Donnie Mesa, MD;  Location: [facility];  Service: General;  Laterality: Left;  . CESAREAN SECTION  [id number]  . COLONOSCOPY    . TUBAL LIGATION    . UPPER GASTROINTESTINAL ENDOSCOPY      Family History  Problem Relation Age of Onset  . Lymphoma Mother   . Dementia Mother   . Cancer Father        lung  . Colon cancer Maternal Uncle   . Esophageal cancer Neg Hx   . Pancreatic cancer Neg Hx   . Rectal cancer Neg Hx   . Stomach cancer Neg Hx   . Thyroid disease Neg Hx     Social History:  reports that she quit smoking about 32 years ago. She  has never used smokeless tobacco. She reports that she drinks alcohol. She reports that she does not use drugs.   Review of Systems:  Review of Systems She is being followed by her PCP for hypertension   Examination:   LMP 02/28/2007    General Appearance: pleasant, has mild generalized obesity          Eyes: No swelling.           Neck: The thyroid is enlarged and palpable mostly on the right side. The thyroid is slightly firm, nodular without any distinct nodules, no tenderness Neck circumference is 40  cm over the mid thyroid There is no stridor. Pemberton sign is negative  There is no lymphadenopathy.     Cardiovascular: Normal  heart sounds, no murmur No carotid bruit heard  Respiratory:  Lungs clear Neurological: REFLEXES: at biceps are normal.  Skin: no rash      Extremities no edema or peripheral joints enlargement  Assessment/Plan:  Multinodular goiter, incidentally diagnosed in 08/2017 Normal thyroid functions  Reports of thyroid ultrasound and biopsy and cytology were reviewed in detail  Although she had multiple nodules on initial ultrasound the largest nodule was apparently was a pseudo- nodule She had a biopsy of the intermediate nodule on the right inferior thyroid lobe which had only scant follicular cells on aspiration  This nodule is relatively low-grade on ultrasound and only 2.1 cm in size  Recommend that she be reexamined in 6 months and consider follow-up ultrasound at that time rather than repeat biopsy Patient was explained the nature of thyroid nodules, current findings and biopsy report She is in favor of not doing another biopsy at this time     Elayne Snare 04/01/2018

## 2018-04-02 ENCOUNTER — Ambulatory Visit: Admitting: Endocrinology

## 2018-04-24 ENCOUNTER — Ambulatory Visit: Admitting: Family

## 2018-04-25 ENCOUNTER — Ambulatory Visit (INDEPENDENT_AMBULATORY_CARE_PROVIDER_SITE_OTHER): Admitting: Endocrinology

## 2018-04-25 ENCOUNTER — Encounter: Payer: Self-pay | Admitting: Endocrinology

## 2018-04-25 VITALS — BP 131/72 | HR 68 | Temp 97.9°F | Ht 68.0 in | Wt 233.2 lb

## 2018-04-25 DIAGNOSIS — E042 Nontoxic multinodular goiter: Secondary | ICD-10-CM | POA: Diagnosis not present

## 2018-04-25 DIAGNOSIS — Z23 Encounter for immunization: Secondary | ICD-10-CM | POA: Diagnosis not present

## 2018-04-25 NOTE — Progress Notes (Addendum)
Patient ID: Nancy Deleon, female   DOB: 1956/07/19, 62 y.o.   MRN: [id number]           Reason for Appointment: Goiter, follow-up visit    History of Present Illness:    The patient's thyroid enlargement was first discovered in 08/2017 incidentally by her PCP She was found to have a thyroid enlargement on the right side and referred for an ultrasound  She has had an ultrasound exam in 08/2017 which shows thyroid enlargement bilaterally, left lobe measuring 6.5 cm Also she was estimated to have 5 nodules over 1 cm Biopsy was recommended for 3 of the nodules but on follow-up exam it was felt that 2 of them were pseudo-nodules  Biopsy was done was on the right inferior intermediate nodule, this is a 2.1 cm solid nodule with TI-RADS score 4 However the cytology only showed scant follicular epithelium although reportedly satisfactory for diagnosis  She is here for follow-up She has had no difficulty with swallowing   She does not feel her neck swelling No complaints of choking sensation in her neck or pressure in any position or when lying down.   Thyroid functions have been normal  Lab Results  Component Value Date   TSH 1.14 08/21/2017   TSH 0.66 02/16/2016   TSH 0.895 02/19/2013   FREET4 0.77 08/21/2017      Allergies as of 04/25/2018      Reactions   Naproxen Nausea And Vomiting   REACTION: vomiting   Ropinirole Nausea Only   Vicodin [hydrocodone-acetaminophen] Nausea And Vomiting   Propoxyphene N-acetaminophen Nausea And Vomiting      Medication List        Accurate as of 04/25/18  4:20 PM. Always use your most recent med list.          amLODipine 10 MG tablet Commonly known as:  NORVASC Take 1 tablet (10 mg total) by mouth daily.   CALTRATE 600+D 600-400 MG-UNIT tablet Generic drug:  Calcium Carbonate-Vitamin D Take 1 tablet by mouth 2 (two) times daily.   Esomeprazole Magnesium 20 MG Tbec Take 1 tablet by mouth daily.   loratadine 10 MG  tablet Commonly known as:  CLARITIN Take 1 tablet (10 mg total) by mouth daily.   meloxicam 7.5 MG tablet Commonly known as:  MOBIC Take 1 tablet (7.5 mg total) by mouth daily as needed for pain. For back pain   pimecrolimus 1 % cream Commonly known as:  ELIDEL Apply topically 2 (two) times daily.       Allergies:  Allergies  Allergen Reactions  . Naproxen Nausea And Vomiting    REACTION: vomiting  . Ropinirole Nausea Only  . Vicodin [Hydrocodone-Acetaminophen] Nausea And Vomiting  . Propoxyphene N-Acetaminophen Nausea And Vomiting    Past Medical History:  Diagnosis Date  . Allergy   . Fatty liver 04/03/2014  . GERD (gastroesophageal reflux disease)   . Hypertension   . 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 Left breast mass   . Multiple thyroid nodules 08/20/2017  . Obesity   . Type 2 diabetes mellitus without complications ([place]) 6/0/6301    Past Surgical History:  Procedure Laterality Date  . BIOPSY THYROID    . BREAST BIOPSY  04/24/11   rigth breast  . BREAST EXCISIONAL BIOPSY    . BREAST LUMPECTOMY WITH RADIOACTIVE SEED LOCALIZATION Left 05/01/2017   Procedure: LEFT BREAST LUMPECTOMY WITH RADIOACTIVE SEED LOCALIZATION;  Surgeon: Donnie Mesa, MD;  Location: [facility];  Service:  General;  Laterality: Left;  . CESAREAN SECTION  [id number]  . COLONOSCOPY    . TUBAL LIGATION    . UPPER GASTROINTESTINAL ENDOSCOPY      Family History  Problem Relation Age of Onset  . Lymphoma Mother   . Dementia Mother   . Cancer Father        lung  . Colon cancer Maternal Uncle   . Esophageal cancer Neg Hx   . Pancreatic cancer Neg Hx   . Rectal cancer Neg Hx   . Stomach cancer Neg Hx   . Thyroid disease Neg Hx     Social History:  reports that she quit smoking about 33 years ago. She has never used smokeless tobacco. She reports that she drinks alcohol. She reports that she does not use drugs.    Review of Systems   She is being followed by her  PCP for hypertension   Examination:   LMP 02/28/2007    The right lobe of the thyroid is enlarged about twice normal, firm mostly on the lateral part, somewhat irregular surface  Left lobe is barely palpable on swallowing Neck circumference is 39 cm over the mid thyroid   There is no cervical lymphadenopathy.     Assessment/Plan:  Multinodular goiter, incidentally diagnosed in 08/2017  She has multiple small nodules Her right sided nodule that was recommended for biopsy by radiologist showed only scant follicular epithelium Not clear this may be related to inadequate sampling or less cellularity because of containing mostly colloid  On exam her thyroid is not any larger on the right side compared to her previous exam The neck circumference is slightly smaller Left side not clearly palpable  Discussed that since she has multiple nodules that are not adequately evaluated on her clinical exam she will have a follow-up ultrasound done and if this is unremarkable and unchanged may not need any further follow-up  Elayne Snare 04/25/2018  Addendum: Previously biopsied nodules does not show any significant change and no further follow-up needed

## 2018-04-30 ENCOUNTER — Ambulatory Visit
Admission: RE | Admit: 2018-04-30 | Discharge: 2018-04-30 | Disposition: A | Discharge status: Home / Self Care | Source: Ambulatory Visit | Attending: Endocrinology | Admitting: Endocrinology

## 2018-04-30 DIAGNOSIS — E042 Nontoxic multinodular goiter: Secondary | ICD-10-CM

## 2018-05-01 NOTE — Progress Notes (Signed)
Please call to let patient know that the ultrasound shows no significant change in her nodule and no further evaluation will be needed

## 2018-06-11 ENCOUNTER — Encounter: Payer: Self-pay | Admitting: Family

## 2018-06-11 ENCOUNTER — Telehealth: Payer: Self-pay | Admitting: Family

## 2018-06-11 ENCOUNTER — Ambulatory Visit (INDEPENDENT_AMBULATORY_CARE_PROVIDER_SITE_OTHER): Admitting: Family

## 2018-06-11 VITALS — BP 131/52 | HR 70 | Temp 98.6°F | Resp 16 | Ht 68.0 in | Wt 232.0 lb

## 2018-06-11 DIAGNOSIS — Z23 Encounter for immunization: Secondary | ICD-10-CM

## 2018-06-11 DIAGNOSIS — E119 Type 2 diabetes mellitus without complications: Secondary | ICD-10-CM | POA: Diagnosis not present

## 2018-06-11 DIAGNOSIS — K219 Gastro-esophageal reflux disease without esophagitis: Secondary | ICD-10-CM

## 2018-06-11 DIAGNOSIS — I10 Essential (primary) hypertension: Secondary | ICD-10-CM

## 2018-06-11 LAB — COMPREHENSIVE METABOLIC PANEL
ALBUMIN: 4.4 g/dL (ref 3.5–5.2)
ALK PHOS: 61 U/L (ref 39–117)
ALT: 10 U/L (ref 0–35)
AST: 12 U/L (ref 0–37)
BUN: 13 mg/dL (ref 6–23)
CO2: 32 mEq/L (ref 19–32)
Calcium: 10.2 mg/dL (ref 8.4–10.5)
Chloride: 105 mEq/L (ref 96–112)
Creatinine, Ser: 0.87 mg/dL (ref 0.40–1.20)
GFR: 84.96 mL/min (ref >60.00)
Glucose, Bld: 128 mg/dL — ABNORMAL HIGH (ref 70–99)
POTASSIUM: 4.3 meq/L (ref 3.5–5.1)
Sodium: 142 mEq/L (ref 135–145)
TOTAL PROTEIN: 7 g/dL (ref 6.0–8.3)
Total Bilirubin: 0.7 mg/dL (ref 0.2–1.2)

## 2018-06-11 LAB — MICROALBUMIN / CREATININE URINE RATIO
Creatinine,U: 258.6 mg/dL
MICROALB UR: 3.4 mg/dL — AB (ref 0.0–1.9)
Microalb Creat Ratio: 1.3 mg/g (ref 0.0–30.0)

## 2018-06-11 LAB — HEMOGLOBIN A1C: HEMOGLOBIN A1C: 6.6 % — AB (ref 4.6–6.5)

## 2018-06-11 MED ORDER — BLOOD GLUCOSE MONITOR KIT: PACK | 0 refills | Status: DC

## 2018-06-11 MED ORDER — LISINOPRIL 2.5 MG PO TABS: 2.5000 mg | ORAL_TABLET | Freq: Every day | ORAL | 0 refills | Status: DC

## 2018-06-11 NOTE — Patient Instructions (Signed)
Please pick up glucometer and then schedule a nurse visit for teaching on proper use. Continue to work on healthy diabetic diet, exercise, weight loss.

## 2018-06-11 NOTE — Progress Notes (Signed)
Subjective:    Patient ID: Nancy Deleon, female    DOB: July 28, 1956, 62 y.o.   MRN: [id number]  HPI Patient is a 62 year old female who presents today for routine follow-up.  DM2- loves iced tea and pasta. Lab Results  Component Value Date   HGBA1C 6.9 (H) 03/13/2018   HGBA1C 5.8 (H) 08/25/2016   HGBA1C 5.9 07/27/2015   Lab Results  Component Value Date   LDLCALC 98 10/19/2017   CREATININE 0.89 03/13/2018   HTN-she is maintained on amlodipine 10 mg once daily. BP Readings from Last 3 Encounters:  06/11/18 (!) 131/52  04/25/18 131/72  03/13/18 128/60   gerd- maintained on nexium 6m. Reports reflux is stable on nexium as long as she avoids spicy foods.       Review of Systems See HPI  Past Medical History:  Diagnosis Date  . Allergy   . Fatty liver 04/03/2014  . GERD (gastroesophageal reflux disease)   . Hypertension   . 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 Left breast mass   . Multiple thyroid nodules 08/20/2017  . Obesity   . [place] 88/08/8297    Social History   Socioeconomic History  . Marital status: Widowed    Spouse name: Not on file  . Number of children: 2  . Years of education: Not on file  . Highest education level: Not on file  Occupational History    Employer: [employer]  Social Needs  . Financial resource strain: Not on file  . Food insecurity:    Worry: Not on file    Inability: Not on file  . Transportation needs:    Medical: Not on file    Non-medical: Not on file  Tobacco Use  . Smoking status: Former Smoker    Last attempt to quit: 04/18/1985    Years since quitting: 33.1  . Smokeless tobacco: Never Used  Substance and Sexual Activity  . Alcohol use: Yes    Alcohol/week: 0.0 standard drinks    Comment: occasional wine  . Drug use: No  . Sexual activity: Yes    Partners: Male    Birth control/protection: Post-menopausal    Comment: 1st intercourse 62yo-Fewer  than 5 partners  Lifestyle  . Physical activity:    Days per week: Not on file    Minutes per session: Not on file  . Stress: Not on file  Relationships  . Social connections:    Talks on phone: Not on file    Gets together: Not on file    Attends religious service: Not on file    Active member of club or organization: Not on file    Attends meetings of clubs or organizations: Not on file    Relationship status: Not on file  . Intimate partner violence:    Fear of current or ex partner: Not on file    Emotionally abused: Not on file    Physically abused: Not on file    Forced sexual activity: Not on file  Other Topics Concern  . Not on file  Social History Narrative   Regular exercise:  Walks 2-5 x weekly   Caffeine Use: no   Widowed   She has a son JJeneen Rinks he is 250yrs old.   Foster parent.   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   Dog (Lab named Abby)  Past Surgical History:  Procedure Laterality Date  . BIOPSY THYROID    . BREAST BIOPSY  04/24/11   rigth breast  . BREAST EXCISIONAL BIOPSY    . BREAST LUMPECTOMY WITH RADIOACTIVE SEED LOCALIZATION Left 05/01/2017   Procedure: LEFT BREAST LUMPECTOMY WITH RADIOACTIVE SEED LOCALIZATION;  Surgeon: Donnie Mesa, MD;  Location: [facility];  Service: General;  Laterality: Left;  . CESAREAN SECTION  [id number]  . COLONOSCOPY    . TUBAL LIGATION    . UPPER GASTROINTESTINAL ENDOSCOPY      Family History  Problem Relation Age of Onset  . Lymphoma Mother   . Dementia Mother   . Cancer Father        lung  . Colon cancer Maternal Uncle   . Esophageal cancer Neg Hx   . Pancreatic cancer Neg Hx   . Rectal cancer Neg Hx   . Stomach cancer Neg Hx   . Thyroid disease Neg Hx     Allergies  Allergen Reactions  . Naproxen Nausea And Vomiting    REACTION: vomiting  . Ropinirole Nausea Only  . Vicodin [Hydrocodone-Acetaminophen] Nausea And  Vomiting  . Propoxyphene N-Acetaminophen Nausea And Vomiting    Current Outpatient Medications on File Prior to Visit  Medication Sig Dispense Refill  . amLODipine (NORVASC) 10 MG tablet Take 1 tablet (10 mg total) by mouth daily. 90 tablet 1  . Calcium Carbonate-Vitamin D (CALTRATE 600+D) 600-400 MG-UNIT per tablet Take 1 tablet by mouth 2 (two) times daily.    . Esomeprazole Magnesium (NEXIUM 24HR) 20 MG TBEC Take 1 tablet by mouth daily. 30 tablet   . loratadine (CLARITIN) 10 MG tablet Take 1 tablet (10 mg total) by mouth daily. (Patient taking differently: Take 10 mg by mouth daily as needed. ) 30 tablet 2  . meloxicam (MOBIC) 7.5 MG tablet Take 1 tablet (7.5 mg total) by mouth daily as needed for pain. For back pain 20 tablet 0  . pimecrolimus (ELIDEL) 1 % cream Apply topically 2 (two) times daily. 30 g 0   No current facility-administered medications on file prior to visit.     BP (!) 131/52 (BP Location: Right Arm, Patient Position: Sitting, Cuff Size: Large)   Pulse 70   Temp 98.6 F (37 C) (Oral)   Resp 16   Ht 5' 8"  (1.727 m)   Wt 232 lb (105.2 kg)   LMP 02/28/2007   SpO2 100%   BMI 35.28 kg/m       Objective:   Physical Exam  Constitutional: She is oriented to person, place, and time. She appears well-developed and well-nourished.  Cardiovascular: Normal rate, regular rhythm and normal heart sounds.  No murmur heard. Pulmonary/Chest: Effort normal and breath sounds normal. No respiratory distress. She has no wheezes.  Musculoskeletal: She exhibits no edema.  Neurological: She is alert and oriented to person, place, and time.  Skin: Skin is warm and dry.  Psychiatric: She has a normal mood and affect. Her behavior is normal. Judgment and thought content normal.          Assessment & Plan:  Diabetes type 2-this is a new diagnosis for her.  We discussed importance of healthy diet, exercise, avoidance of foods with added sugar and limiting portions of  carbohydrates.  Will obtain follow-up A1c, urine microalbumin, and give her the Pneumovax today.  She reports that she is up-to-date on her eye exam.  I advised her to check her sugars 1-2 times daily record her readings  and bring these with her to her follow-up appointments.  I have provided her with a prescription for glucometer kit.  She is instructed to call and schedule a nurse visit when she obtains the device for teaching.  Hypertension-blood pressure is stable on current medication, continue same.  GERD-stable on PPI.  Continue same.

## 2018-06-11 NOTE — Telephone Encounter (Signed)
Please let pt know that there is microscopic blood in her urine. I would like her to start lisinopril for kidney protection. [facility]  A1C is improved some.  Was 6.9 now 6.6.

## 2018-06-12 ENCOUNTER — Ambulatory Visit: Admitting: Family

## 2018-06-12 NOTE — Telephone Encounter (Signed)
Pt called in wanting to know about how long she would have to take this new medication.

## 2018-06-12 NOTE — Telephone Encounter (Signed)
Pt. Called back, questioning need for lisinopril, as she is on amlodopine and is afraid her BP will drop. Author explained that the lisinopril is for her kidneys, and it is a low dose, but pt. wanted clarification from [facility], prefers to have [employer] sent. Routed to PCP.

## 2018-06-12 NOTE — Telephone Encounter (Signed)
Author phoned pt. To review PCP recommendations. No answer, author left generic VM and sent mychart message.

## 2018-09-16 ENCOUNTER — Ambulatory Visit (INDEPENDENT_AMBULATORY_CARE_PROVIDER_SITE_OTHER): Admitting: Family

## 2018-09-16 ENCOUNTER — Encounter: Payer: Self-pay | Admitting: Family

## 2018-09-16 VITALS — BP 120/60 | HR 65 | Temp 98.4°F | Resp 16 | Ht 68.0 in | Wt 224.0 lb

## 2018-09-16 DIAGNOSIS — Z0000 Encounter for general adult medical examination without abnormal findings: Secondary | ICD-10-CM

## 2018-09-16 DIAGNOSIS — E119 Type 2 diabetes mellitus without complications: Secondary | ICD-10-CM | POA: Diagnosis not present

## 2018-09-16 DIAGNOSIS — Z0001 Encounter for general adult medical examination with abnormal findings: Secondary | ICD-10-CM

## 2018-09-16 DIAGNOSIS — Z23 Encounter for immunization: Secondary | ICD-10-CM

## 2018-09-16 DIAGNOSIS — E348 Other specified endocrine disorders: Secondary | ICD-10-CM

## 2018-09-16 DIAGNOSIS — N63 Unspecified lump in unspecified breast: Secondary | ICD-10-CM | POA: Diagnosis not present

## 2018-09-16 LAB — CBC WITH DIFFERENTIAL/PLATELET
Basophils Absolute: 0.1 10*3/uL (ref 0.0–0.1)
Basophils Relative: 1 % (ref 0.0–3.0)
Eosinophils Absolute: 0.2 10*3/uL (ref 0.0–0.7)
Eosinophils Relative: 4.3 % (ref 0.0–5.0)
HCT: 38.7 % (ref 36.0–46.0)
Hemoglobin: 13.2 g/dL (ref 12.0–15.0)
LYMPHS ABS: 1.9 10*3/uL (ref 0.7–4.0)
Lymphocytes Relative: 37.1 % (ref 12.0–46.0)
MCHC: 34 g/dL (ref 30.0–36.0)
MCV: 74 fl — AB (ref 78.0–100.0)
Monocytes Absolute: 0.4 10*3/uL (ref 0.1–1.0)
Monocytes Relative: 7.4 % (ref 3.0–12.0)
NEUTROS PCT: 50.2 % (ref 43.0–77.0)
Neutro Abs: 2.6 10*3/uL (ref 1.4–7.7)
Platelets: 166 10*3/uL (ref 150.0–400.0)
RBC: 5.23 Mil/uL — ABNORMAL HIGH (ref 3.87–5.11)
RDW: 13.9 % (ref 11.5–15.5)
WBC: 5.2 10*3/uL (ref 4.0–10.5)

## 2018-09-16 LAB — URINALYSIS, ROUTINE W REFLEX MICROSCOPIC
HGB URINE DIPSTICK: NEGATIVE
Ketones, ur: NEGATIVE
LEUKOCYTES UA: NEGATIVE
Nitrite: NEGATIVE
Specific Gravity, Urine: 1.025 (ref 1.000–1.030)
Urine Glucose: NEGATIVE
Urobilinogen, UA: 1 (ref 0.0–1.0)
pH: 5.5 (ref 5.0–8.0)

## 2018-09-16 LAB — HEPATIC FUNCTION PANEL
ALBUMIN: 4.4 g/dL (ref 3.5–5.2)
ALT: 68 U/L — ABNORMAL HIGH (ref 0–35)
AST: 49 U/L — ABNORMAL HIGH (ref 0–37)
Alkaline Phosphatase: 68 U/L (ref 39–117)
Bilirubin, Direct: 0.2 mg/dL (ref 0.0–0.3)
Total Bilirubin: 1 mg/dL (ref 0.2–1.2)
Total Protein: 7.1 g/dL (ref 6.0–8.3)

## 2018-09-16 LAB — BASIC METABOLIC PANEL
BUN: 14 mg/dL (ref 6–23)
CO2: 30 mEq/L (ref 19–32)
Calcium: 10 mg/dL (ref 8.4–10.5)
Chloride: 103 mEq/L (ref 96–112)
Creatinine, Ser: 0.87 mg/dL (ref 0.40–1.20)
GFR: 79.86 mL/min (ref >60.00)
GLUCOSE: 115 mg/dL — AB (ref 70–99)
Potassium: 4 mEq/L (ref 3.5–5.1)
Sodium: 141 mEq/L (ref 135–145)

## 2018-09-16 LAB — LIPID PANEL
CHOLESTEROL: 183 mg/dL (ref 0–200)
HDL: 56.4 mg/dL (ref >39.00)
LDL Cholesterol: 111 mg/dL — ABNORMAL HIGH (ref 0–99)
NonHDL: 126.13
Total CHOL/HDL Ratio: 3
Triglycerides: 78 mg/dL (ref 0.0–149.0)
VLDL: 15.6 mg/dL (ref 0.0–40.0)

## 2018-09-16 LAB — HEMOGLOBIN A1C: Hgb A1c MFr Bld: 6.1 % (ref 4.6–6.5)

## 2018-09-16 LAB — TSH: TSH: 0.45 u[IU]/mL (ref 0.35–4.50)

## 2018-09-16 NOTE — Progress Notes (Signed)
Subjective:    Patient ID: Nancy Deleon, female    DOB: 1955/10/11, 63 y.o.   MRN: [id number]  HPI  Patient presents today for complete physical.  Immunizations: tetanus is up to date.  Diet:  Reports that she is struggling with diet but trying Wt Readings from Last 3 Encounters:  09/16/18 224 lb (101.6 kg)  06/11/18 232 lb (105.2 kg)  04/25/18 233 lb 3.2 oz (105.8 kg)  Exercise: twice a week Colonoscopy: due next year Dexa:  2017 Pap Smear: due, she will see GYN in March (sees Dr. Phineas Real) Mammogram: 8/19 Dental:  Up to date Vision: has appointment tomorrow   DM2- reports that she has been working on her diet.  Lab Results  Component Value Date   HGBA1C 6.6 (H) 06/11/2018      Review of Systems  Constitutional: Negative for unexpected weight change.  HENT: Negative for hearing loss and rhinorrhea.   Eyes: Negative for visual disturbance.  Respiratory: Negative for cough.   Cardiovascular: Negative for leg swelling.  Gastrointestinal: Negative for blood in stool, constipation and diarrhea.  Genitourinary: Negative for dysuria, frequency and hematuria.  Musculoskeletal: Negative for arthralgias and myalgias.  Skin: Negative for rash.  Neurological: Negative for headaches.  Hematological: Negative for adenopathy.  Psychiatric/Behavioral:       Denies anxiety or depression      see HPI  Past Medical History:  Diagnosis Date  . Allergy   . Fatty liver 04/03/2014  . GERD (gastroesophageal reflux disease)   . Hypertension   . 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 Left breast mass   . Multiple thyroid nodules 08/20/2017  . Obesity   . Type 2 diabetes mellitus without complications ([place]) 09/11/9561  . Type 2 diabetes mellitus, controlled, with renal complications ([place]) 03/13/5642     Social History   Socioeconomic History  . Marital status: Widowed    Spouse name: Not on file  . Number of children: 2  . Years of education: Not on file  . Highest  education level: Not on file  Occupational History    Employer: [employer]  Social Needs  . Financial resource strain: Not on file  . Food insecurity:    Worry: Not on file    Inability: Not on file  . Transportation needs:    Medical: Not on file    Non-medical: Not on file  Tobacco Use  . Smoking status: Former Smoker    Last attempt to quit: 04/18/1985    Years since quitting: 33.4  . Smokeless tobacco: Never Used  Substance and Sexual Activity  . Alcohol use: Yes    Alcohol/week: 0.0 standard drinks    Comment: occasional wine  . Drug use: No  . Sexual activity: Yes    Partners: Male    Birth control/protection: Post-menopausal    Comment: 1st intercourse 63 yo-Fewer than 5 partners  Lifestyle  . Physical activity:    Days per week: Not on file    Minutes per session: Not on file  . Stress: Not on file  Relationships  . Social connections:    Talks on phone: Not on file    Gets together: Not on file    Attends religious service: Not on file    Active member of club or organization: Not on file    Attends meetings of clubs or organizations: Not on file    Relationship status: Not on file  . Intimate partner violence:    Fear  of current or ex partner: Not on file    Emotionally abused: Not on file    Physically abused: Not on file    Forced sexual activity: Not on file  Other Topics Concern  . Not on file  Social History Narrative   Regular exercise:  Walks 2-5 x weekly   Caffeine Use: no   Widowed   She has a son [name]- he is 8 yrs old.   Foster parent.   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   Dog (Lab named Abby)          Past Surgical History:  Procedure Laterality Date  . BIOPSY THYROID    . BREAST BIOPSY  04/24/11   rigth breast  . BREAST EXCISIONAL BIOPSY    . BREAST LUMPECTOMY WITH RADIOACTIVE SEED LOCALIZATION Left 05/01/2017   Procedure: LEFT BREAST LUMPECTOMY WITH RADIOACTIVE  SEED LOCALIZATION;  Surgeon: Donnie Mesa, MD;  Location: [facility];  Service: General;  Laterality: Left;  . CESAREAN SECTION  [id number]  . COLONOSCOPY    . TUBAL LIGATION    . UPPER GASTROINTESTINAL ENDOSCOPY      Family History  Problem Relation Age of Onset  . Lymphoma Mother   . Dementia Mother   . Cancer Father        lung  . Colon cancer Maternal Uncle   . Esophageal cancer Neg Hx   . Pancreatic cancer Neg Hx   . Rectal cancer Neg Hx   . Stomach cancer Neg Hx   . Thyroid disease Neg Hx     Allergies  Allergen Reactions  . Naproxen Nausea And Vomiting    REACTION: vomiting  . Ropinirole Nausea Only  . Vicodin [Hydrocodone-Acetaminophen] Nausea And Vomiting  . Propoxyphene N-Acetaminophen Nausea And Vomiting    Current Outpatient Medications on File Prior to Visit  Medication Sig Dispense Refill  . amLODipine (NORVASC) 10 MG tablet Take 1 tablet (10 mg total) by mouth daily. 90 tablet 1  . blood glucose meter kit and supplies KIT Dispense based on patient and insurance preference. Check sugar 1-2 times daily.  (FOR ICD-9 250.00, 250.) 1 each 0  . Calcium Carbonate-Vitamin D (CALTRATE 600+D) 600-400 MG-UNIT per tablet Take 1 tablet by mouth 2 (two) times daily.    . Esomeprazole Magnesium (NEXIUM 24HR) 20 MG TBEC Take 1 tablet by mouth daily. 30 tablet   . lisinopril (PRINIVIL,ZESTRIL) 2.5 MG tablet Take 1 tablet (2.5 mg total) by mouth daily. 90 tablet 0  . loratadine (CLARITIN) 10 MG tablet Take 1 tablet (10 mg total) by mouth daily. (Patient taking differently: Take 10 mg by mouth daily as needed. ) 30 tablet 2  . meloxicam (MOBIC) 7.5 MG tablet Take 1 tablet (7.5 mg total) by mouth daily as needed for pain. For back pain 20 tablet 0  . pimecrolimus (ELIDEL) 1 % cream Apply topically 2 (two) times daily. 30 g 0   No current facility-administered medications on file prior to visit.     BP 120/60 (BP Location: Right Arm, Patient Position: Sitting,  Cuff Size: Large)   Pulse 65   Temp 98.4 F (36.9 C) (Oral)   Resp 16   Ht 5' 8"  (1.727 m)   Wt 224 lb (101.6 kg)   LMP 02/28/2007   SpO2 100%   BMI 34.06 kg/m    Objective:   Physical Exam Physical Exam  Constitutional: She  is oriented to person, place, and time. She appears well-developed and well-nourished. No distress.  HENT:  Head: Normocephalic and atraumatic.  Right Ear: Tympanic membrane and ear canal normal.  Left Ear: Tympanic membrane and ear canal normal.  Mouth/Throat: Oropharynx is clear and moist.  Eyes: Pupils are equal, round, and reactive to light. No scleral icterus.  Neck: Normal range of motion. No thyromegaly present.  Cardiovascular: Normal rate and regular rhythm.   No murmur heard. Pulmonary/Chest: Effort normal and breath sounds normal. No respiratory distress. He has no wheezes. She has no rales. She exhibits no tenderness.  Abdominal: Soft. Bowel sounds are normal. She exhibits no distension and no mass. There is no tenderness. There is no rebound and no guarding.  Musculoskeletal: She exhibits no edema.  Lymphadenopathy:    She has no cervical adenopathy.  Neurological: She is alert and oriented to person, place, and time. She has normal patellar reflexes. She exhibits normal muscle tone. Coordination normal.  Skin: Skin is warm and dry.  Psychiatric: She has a normal mood and affect. Her behavior is normal. Judgment and thought content normal.  Breasts: Examined lying Right: Without retractions, discharge or axillary adenopathy.  Firm, mobile bb sized mass at 12 o'clock  Left: Without masses, retractions, discharge or axillary adenopathy.  Pelvic: deferred        Assessment & Plan:   Preventative care- she has lost some weight. I have encouraged her to keep up the good work. Will obtain routine lab work. Tdap today, she will get pap with GYN next month.   Breast mass- new- refer for diagnostic mammo/US of the right breast.   DM2- obtain  follow up A1C. I expect it will be improved with her recent weight loss.      Assessment & Plan:

## 2018-09-16 NOTE — Patient Instructions (Signed)
Please complete lab work prior to leaving. You should be contacted about scheduling your mammogram at the [facility].

## 2018-09-17 ENCOUNTER — Telehealth: Payer: Self-pay | Admitting: Family

## 2018-09-17 ENCOUNTER — Encounter: Payer: Self-pay | Admitting: Family

## 2018-09-17 DIAGNOSIS — R7989 Other specified abnormal findings of blood chemistry: Secondary | ICD-10-CM

## 2018-09-17 DIAGNOSIS — R945 Abnormal results of liver function studies: Secondary | ICD-10-CM

## 2018-09-17 LAB — HM DIABETES EYE EXAM

## 2018-09-17 MED ORDER — ATORVASTATIN CALCIUM 10 MG PO TABS: 10.0000 mg | ORAL_TABLET | Freq: Every day | ORAL | 3 refills | Status: DC

## 2018-09-17 NOTE — Telephone Encounter (Signed)
Please let pt know that her lab work shows improvement in her sugar.  I would like her to start lipitor to lower her cholesterol and decrease her risk of heart attack and stroke. I sent rx to [facility].

## 2018-09-17 NOTE — Telephone Encounter (Signed)
Results given to patient, she was very hesitant to start lipitor. I advised her per Lenna Sciara it is recommended to decrease the risk of heart disease and stroke.

## 2018-09-20 ENCOUNTER — Ambulatory Visit (HOSPITAL_BASED_OUTPATIENT_CLINIC_OR_DEPARTMENT_OTHER)
Admission: RE | Admit: 2018-09-20 | Discharge: 2018-09-20 | Disposition: A | Discharge status: Home / Self Care | Source: Ambulatory Visit | Attending: Family | Admitting: Family

## 2018-09-20 DIAGNOSIS — E348 Other specified endocrine disorders: Secondary | ICD-10-CM | POA: Diagnosis present

## 2018-09-24 ENCOUNTER — Telehealth: Payer: Self-pay | Admitting: Family

## 2018-09-24 DIAGNOSIS — K8689 Other specified diseases of pancreas: Secondary | ICD-10-CM

## 2018-09-24 NOTE — Telephone Encounter (Signed)
Attempted to reach patient to review ultrasound results.  No answer, left message requesting patient to check her MyChart messages and to contact me with any further questions or concerns.

## 2018-09-26 ENCOUNTER — Encounter (HOSPITAL_BASED_OUTPATIENT_CLINIC_OR_DEPARTMENT_OTHER): Payer: Self-pay

## 2018-09-26 ENCOUNTER — Telehealth: Payer: Self-pay | Admitting: Family

## 2018-09-26 ENCOUNTER — Ambulatory Visit (HOSPITAL_BASED_OUTPATIENT_CLINIC_OR_DEPARTMENT_OTHER)
Admission: RE | Admit: 2018-09-26 | Discharge: 2018-09-26 | Disposition: A | Discharge status: Home / Self Care | Source: Ambulatory Visit | Attending: Family | Admitting: Family

## 2018-09-26 DIAGNOSIS — K8689 Other specified diseases of pancreas: Secondary | ICD-10-CM | POA: Diagnosis present

## 2018-09-26 MED ORDER — IOPAMIDOL (ISOVUE-370) INJECTION 76%
100.0000 mL | Freq: Once | INTRAVENOUS | Status: AC | PRN
  Administered 2018-09-26: 100 mL via INTRAVENOUS

## 2018-09-26 NOTE — Telephone Encounter (Signed)
Reviewed results of the CT with pt.  She verbalized understanding. We discussed importance of lipid management with finding of aortic atherosclerosis. Declines lipitor. Wants to work on diet first.

## 2018-10-08 ENCOUNTER — Encounter: Admitting: Gynecology

## 2018-10-09 ENCOUNTER — Ambulatory Visit (INDEPENDENT_AMBULATORY_CARE_PROVIDER_SITE_OTHER): Admitting: Gynecology

## 2018-10-09 ENCOUNTER — Encounter: Payer: Self-pay | Admitting: Gynecology

## 2018-10-09 VITALS — BP 120/78 | Ht 67.0 in | Wt 227.0 lb

## 2018-10-09 DIAGNOSIS — N952 Postmenopausal atrophic vaginitis: Secondary | ICD-10-CM | POA: Diagnosis not present

## 2018-10-09 DIAGNOSIS — Z01419 Encounter for gynecological examination (general) (routine) without abnormal findings: Secondary | ICD-10-CM | POA: Diagnosis not present

## 2018-10-09 NOTE — Progress Notes (Signed)
    Nancy Deleon 1956-06-20 [id number]        62 y.o.  G1P1 for annual gynecologic exam.  Without gynecologic complaints  Past medical history,surgical history, problem list, medications, allergies, family history and social history were all reviewed and documented as reviewed in the EPIC chart.  ROS:  Performed with pertinent positives and negatives included in the history, assessment and plan.   Additional significant findings : None   Exam: [name] assistant Vitals:   10/09/18 1512  BP: 120/78  Weight: 227 lb (103 kg)  Height: 5\' 7"  (1.702 m)   Body mass index is 35.55 kg/m.  General appearance:  Normal affect, orientation and appearance. Skin: Grossly normal HEENT: Without gross lesions.  No cervical or supraclavicular adenopathy. Thyroid normal.  Lungs:  Clear without wheezing, rales or rhonchi Cardiac: RR, without RMG Abdominal:  Soft, nontender, without masses, guarding, rebound, organomegaly or hernia Breasts:  Examined lying and sitting without masses, retractions, discharge or axillary adenopathy. Pelvic:  Ext, BUS, Vagina: With atrophic changes  Cervix: With atrophic changes.  Pap smear/HPV  Uterus: Anteverted, normal size, shape and contour, midline and mobile nontender   Adnexa: Without masses or tenderness    Anus and perineum: Normal   Rectovaginal: Normal sphincter tone without palpated masses or tenderness.    Assessment/Plan:  63 y.o. G1P1 female for annual gynecologic exam.   1. Postmenopausal.  No significant menopausal symptoms or any vaginal bleeding. 2. Pap smear/HPV 2015.  Pap smear/HPV today.  No history of abnormal Pap smears previously.  Continue every 5-year screening per current screening guidelines. 3. Mammography 03/2018.  Continue with annual mammography when due.  Breast exam normal today. 4. Colonoscopy 2011.  Repeat at their recommended interval next year. 5. DEXA 2020 normal.  Recommend repeat DEXA at age 32. 6. Health  maintenance.  No routine lab work done as patient does this elsewhere.  Follow-up 1 year, sooner as needed.   Anastasio Auerbach MD, 3:36 PM 10/09/2018

## 2018-10-09 NOTE — Addendum Note (Signed)
Addended by: Nelva Nay on: 10/09/2018 04:25 PM   Modules accepted: Orders

## 2018-10-09 NOTE — Patient Instructions (Signed)
Follow-up in 1 year for annual exam 

## 2018-10-10 LAB — PAP IG AND HPV HIGH-RISK: HPV DNA High Risk: NOT DETECTED

## 2018-10-23 ENCOUNTER — Ambulatory Visit
Admission: RE | Admit: 2018-10-23 | Discharge: 2018-10-23 | Disposition: A | Discharge status: Home / Self Care | Source: Ambulatory Visit | Attending: Family | Admitting: Family

## 2018-10-23 ENCOUNTER — Other Ambulatory Visit: Payer: Self-pay

## 2018-10-23 DIAGNOSIS — N63 Unspecified lump in unspecified breast: Secondary | ICD-10-CM

## 2018-11-05 ENCOUNTER — Encounter: Payer: Self-pay | Admitting: Gynecology

## 2018-11-06 ENCOUNTER — Other Ambulatory Visit: Payer: Self-pay | Admitting: Family

## 2018-12-16 ENCOUNTER — Other Ambulatory Visit: Payer: Self-pay

## 2018-12-16 ENCOUNTER — Telehealth (INDEPENDENT_AMBULATORY_CARE_PROVIDER_SITE_OTHER): Admitting: Family

## 2018-12-16 DIAGNOSIS — R739 Hyperglycemia, unspecified: Secondary | ICD-10-CM

## 2018-12-16 DIAGNOSIS — K219 Gastro-esophageal reflux disease without esophagitis: Secondary | ICD-10-CM

## 2018-12-16 DIAGNOSIS — M546 Pain in thoracic spine: Secondary | ICD-10-CM

## 2018-12-16 DIAGNOSIS — I10 Essential (primary) hypertension: Secondary | ICD-10-CM | POA: Diagnosis not present

## 2018-12-16 DIAGNOSIS — M545 Low back pain: Secondary | ICD-10-CM | POA: Diagnosis not present

## 2018-12-16 MED ORDER — BLOOD GLUCOSE MONITOR KIT: PACK | 0 refills | Status: DC

## 2018-12-16 NOTE — Progress Notes (Signed)
Virtual Visit via Video Note  I connected with Ocie Cornfield on 12/16/18 at 10:20 AM EDT by a video enabled telemedicine application and verified that I am speaking with the correct person using two identifiers. This visit type was conducted due to national recommendations for restrictions regarding the COVID-19 Pandemic (e.g. social distancing).  This format is felt to be most appropriate for this patient at this time.   I discussed the limitations of evaluation and management by telemedicine and the availability of in person appointments. The patient expressed understanding and agreed to proceed.  Only the patient and myself were on today's video visit. The patient was at home and I was at home at the time of today's visit.   History of Present Illness:  Patient is a 63 yr old female who presents today for follow up.   HTN- pt reports that her bp is 131/78 this AM and pulse is 65.  Continues amlodipine.   Seasonal allergies- using claritin.   GERD- reports that she only uses nexium prn but has not needed recently   Not checking sugars.   Lab Results  Component Value Date   HGBA1C 6.1 09/16/2018   HGBA1C 6.6 (H) 06/11/2018   HGBA1C 6.9 (H) 03/13/2018   Lab Results  Component Value Date   MICROALBUR 3.4 (H) 06/11/2018   LDLCALC 111 (H) 09/16/2018   CREATININE 0.87 09/16/2018      Observations/Objective:   Gen: Awake, alert, no acute distress Resp: Breathing is even and non-labored Psych: calm/pleasant demeanor Neuro: Alert and Oriented x 3, + facial symmetry, speech is clear.   Assessment and Plan:  HTN- bp is stable on current medications. Continue same. Plan bmet next office visit.   Hyperglycemia- reports that she has made some dietary changes and it trying to watch her diet. She is not checking sugars.  Never picked up the glucometer.   GERD- improved with improvement in her diet.  Notes that she only uses nexium rarely  Low back pain- continues to have some low  back pain. She was doing PT but this was discontinued due to COVID-19. Advised pt to work on the exercises she learned in PT at home.      Follow Up Instructions:    I discussed the assessment and treatment plan with the patient. The patient was provided an opportunity to ask questions and all were answered. The patient agreed with the plan and demonstrated an understanding of the instructions.   The patient was advised to call back or seek an in-person evaluation if the symptoms worsen or if the condition fails to improve as anticipated.    Nance Pear, NP

## 2019-01-21 ENCOUNTER — Telehealth: Payer: Self-pay | Admitting: Family

## 2019-01-21 NOTE — Telephone Encounter (Signed)
Copied from [place] [id number]. Topic: Quick Communication - See Telephone Encounter >> Jan 21, 2019  7:43 AM Rayann Heman wrote: CRM for notification. See Telephone encounter for: 01/21/19. Pt called and stated that she would like a call back from the nurse. Pt states that she would like something for allergies. Pt states that she has a lot of drainage in throat. Please advise

## 2019-01-22 ENCOUNTER — Telehealth: Payer: Self-pay | Admitting: Family

## 2019-01-22 ENCOUNTER — Encounter: Payer: Self-pay | Admitting: Family

## 2019-01-22 ENCOUNTER — Other Ambulatory Visit

## 2019-01-22 ENCOUNTER — Other Ambulatory Visit: Payer: Self-pay

## 2019-01-22 ENCOUNTER — Telehealth: Payer: Self-pay | Admitting: *Deleted

## 2019-01-22 ENCOUNTER — Ambulatory Visit (INDEPENDENT_AMBULATORY_CARE_PROVIDER_SITE_OTHER): Admitting: Family

## 2019-01-22 DIAGNOSIS — Z20822 Contact with and (suspected) exposure to covid-19: Secondary | ICD-10-CM

## 2019-01-22 DIAGNOSIS — B349 Viral infection, unspecified: Secondary | ICD-10-CM | POA: Diagnosis not present

## 2019-01-22 DIAGNOSIS — Z20828 Contact with and (suspected) exposure to other viral communicable diseases: Secondary | ICD-10-CM

## 2019-01-22 MED ORDER — BENZONATATE 100 MG PO CAPS: 100.0000 mg | ORAL_CAPSULE | Freq: Three times a day (TID) | ORAL | 0 refills | Status: DC | PRN

## 2019-01-22 NOTE — Progress Notes (Signed)
Virtual Visit via Video Note  I connected with Nancy Deleon on 01/22/19 at  9:00 AM EDT by a video enabled telemedicine application and verified that I am speaking with the correct person using two identifiers.  Location: Patient: home Provider: work   I discussed the limitations of evaluation and management by telemedicine and the availability of in person appointments. The patient expressed understanding and agreed to proceed.  History of Present Illness:  Patient is a 63 yr old female who presents today with complaint of dry cough. Reports that cough began 3 days ago. Reports + nasal drainage (clear in color). Denies fever.  + HA, some pressure behind the eyes. Denies body aches.   Mom died recently last 09/17/2022.  Reports that she spent a long time outside following the funeral.  She reports that she did see a lot of people at the funeral parlor but people were wearing masks.  Past Medical History:  Diagnosis Date  . Allergy   . Fatty liver 04/03/2014  . GERD (gastroesophageal reflux disease)   . Hypertension   . 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 Left breast mass   . Multiple thyroid nodules 08/20/2017  . Obesity   . Type 2 diabetes mellitus without complications ([place]) 03/08/5052  . Type 2 diabetes mellitus, controlled, with renal complications ([place]) 04/13/6733     Social History   Socioeconomic History  . Marital status: Widowed    Spouse name: Not on file  . Number of children: 2  . Years of education: Not on file  . Highest education level: Not on file  Occupational History    Employer: [employer]  Social Needs  . Financial resource strain: Not on file  . Food insecurity    Worry: Not on file    Inability: Not on file  . Transportation needs    Medical: Not on file    Non-medical: Not on file  Tobacco Use  . Smoking status: Never Smoker  . Smokeless tobacco: Never Used  Substance and Sexual Activity  . Alcohol use: Yes    Alcohol/week:  0.0 standard drinks    Comment: occasional wine  . Drug use: No  . Sexual activity: Yes    Partners: Male    Birth control/protection: Post-menopausal    Comment: 1st intercourse 63 yo-Fewer than 5 partners  Lifestyle  . Physical activity    Days per week: Not on file    Minutes per session: Not on file  . Stress: Not on file  Relationships  . Social Herbalist on phone: Not on file    Gets together: Not on file    Attends religious service: Not on file    Active member of club or organization: Not on file    Attends meetings of clubs or organizations: Not on file    Relationship status: Not on file  . Intimate partner violence    Fear of current or ex partner: Not on file    Emotionally abused: Not on file    Physically abused: Not on file    Forced sexual activity: Not on file  Other Topics Concern  . Not on file  Social History Narrative   Regular exercise:  Walks 2-5 x weekly   Caffeine Use: no   Widowed   She has a son Nancy Deleon- he is 48 yrs old.   Foster parent.   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   Dog (Lab named Abby)          Past Surgical History:  Procedure Laterality Date  . BIOPSY THYROID    . BREAST BIOPSY  04/24/11   rigth breast  . BREAST EXCISIONAL BIOPSY    . BREAST LUMPECTOMY WITH RADIOACTIVE SEED LOCALIZATION Left 05/01/2017   Procedure: LEFT BREAST LUMPECTOMY WITH RADIOACTIVE SEED LOCALIZATION;  Surgeon: Donnie Mesa, MD;  Location: [facility];  Service: General;  Laterality: Left;  . CESAREAN SECTION  [id number]  . COLONOSCOPY    . TUBAL LIGATION    . UPPER GASTROINTESTINAL ENDOSCOPY      Family History  Problem Relation Age of Onset  . Lymphoma Mother   . Dementia Mother   . Cancer Father        lung  . Colon cancer Maternal Uncle   . Esophageal cancer Neg Hx   . Pancreatic cancer Neg Hx   . Rectal cancer Neg Hx   . Stomach cancer Neg Hx   .  Thyroid disease Neg Hx     Allergies  Allergen Reactions  . Naproxen Nausea And Vomiting    REACTION: vomiting  . Ropinirole Nausea Only  . Vicodin [Hydrocodone-Acetaminophen] Nausea And Vomiting  . Propoxyphene N-Acetaminophen Nausea And Vomiting    Current Outpatient Medications on File Prior to Visit  Medication Sig Dispense Refill  . amLODipine (NORVASC) 10 MG tablet TAKE 1 TABLET DAILY 90 tablet 1  . blood glucose meter kit and supplies KIT Dispense based on patient and insurance preference. Check sugar 1-2 times daily.  (FOR ICD-9 250.00, 250.) 1 each 0  . Esomeprazole Magnesium (NEXIUM 24HR) 20 MG TBEC Take 1 tablet by mouth daily. 30 tablet   . loratadine (CLARITIN) 10 MG tablet Take 1 tablet (10 mg total) by mouth daily. (Patient taking differently: Take 10 mg by mouth daily as needed. ) 30 tablet 2  . meloxicam (MOBIC) 7.5 MG tablet Take 1 tablet (7.5 mg total) by mouth daily as needed for pain. For back pain 20 tablet 0   No current facility-administered medications on file prior to visit.     LMP 02/28/2007     Observations/Objective:   Gen: Awake, alert, no acute distress Resp: Breathing is even and non-labored Psych: calm/pleasant demeanor Neuro: Alert and Oriented x 3, + facial symmetry, speech is clear.  Assessment and Plan:  Viral illness- given symptoms and recent exposure at funeral home to multiple different people, will refer for COVID-19 testing.  Pt is advised to self quarantine until further recommendations and review of COVID-19 results. Discussed supportive measures including tylenol, tessalon prn cough, fluids/rest. She is advised to call if new/worsening symptoms. Advised to go to the ER if she develops shortness of breath. Pt verbalizes understanding.    Follow Up Instructions:    I discussed the assessment and treatment plan with the patient. The patient was provided an opportunity to ask questions and all were answered. The patient agreed  with the plan and demonstrated an understanding of the instructions.   The patient was advised to call back or seek an in-person evaluation if the symptoms worsen or if the condition fails to improve as anticipated.   Nancy Pear, NP

## 2019-01-22 NOTE — Telephone Encounter (Signed)
Pt referred for covid-19 testing by her provider, Nancy Deleon.  Pt notified and scheduled for today at the [facility] site at 11:15. Pt advised of it being a drive thru test site and to wear a mask, stay in the car with windows rolled up until ready to be tested. Pt voiced understanding.

## 2019-01-22 NOTE — Telephone Encounter (Signed)
Was seen today .

## 2019-01-22 NOTE — Telephone Encounter (Signed)
63 yr old female with 3 day  History of cough, nasal congestion.  Needs covid testing.

## 2019-01-25 ENCOUNTER — Encounter: Payer: Self-pay | Admitting: Family

## 2019-01-25 LAB — NOVEL CORONAVIRUS, NAA: SARS-CoV-2, NAA: NOT DETECTED

## 2019-01-27 ENCOUNTER — Telehealth: Payer: Self-pay

## 2019-01-27 ENCOUNTER — Telehealth (INDEPENDENT_AMBULATORY_CARE_PROVIDER_SITE_OTHER): Admitting: Family

## 2019-01-27 ENCOUNTER — Encounter: Payer: Self-pay | Admitting: Family

## 2019-01-27 DIAGNOSIS — J069 Acute upper respiratory infection, unspecified: Secondary | ICD-10-CM | POA: Diagnosis not present

## 2019-01-27 NOTE — Telephone Encounter (Signed)
Copied from [place] [id number]. Topic: Appointment Scheduling - Scheduling Inquiry for Clinic >> Jan 27, 2019  8:28 AM Scherrie Gerlach wrote: Reason for CRM: pt sent mychart message and Lenna Sciara advised stay home for 3 days after sx and fever free.  Pt states no fever, however she still has congestion, (you can clearly hear) BUT wants to return to work tomorrow.  Requesting virtual visit w/ Lenna Sciara to talk about her symptoms, and go back to work asap.

## 2019-01-27 NOTE — Progress Notes (Signed)
Virtual Visit via Video Note  I connected with Nancy Deleon on 01/27/19 at  1:00 PM EDT by a video enabled telemedicine application and verified that I am speaking with the correct person using two identifiers.  Location: Patient: home Provider: home   I discussed the limitations of evaluation and management by telemedicine and the availability of in person appointments. The patient expressed understanding and agreed to proceed.  History of Present Illness:  Patient is a 63 yr old female who presents today to discuss return to work. We saw her on 01/22/19 with URI symptoms. She was referred for COVID-19 testing. COVID testing is negative.  Reports overall improvement in her symptoms. Only remaining symptom is some mild congestion in her nose.  She denies fever. She denies body aches, sore throat, headache and shortness of breath.  She reports "very little cough."   Past Medical History:  Diagnosis Date  . Allergy   . Fatty liver 04/03/2014  . GERD (gastroesophageal reflux disease)   . Hypertension   . 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 Left breast mass   . Multiple thyroid nodules 08/20/2017  . Obesity   . Type 2 diabetes mellitus without complications ([place]) 12/13/5636  . Type 2 diabetes mellitus, controlled, with renal complications ([place]) 02/09/6432     Social History   Socioeconomic History  . Marital status: Widowed    Spouse name: Not on file  . Number of children: 2  . Years of education: Not on file  . Highest education level: Not on file  Occupational History    Employer: [employer]  Social Needs  . Financial resource strain: Not on file  . Food insecurity    Worry: Not on file    Inability: Not on file  . Transportation needs    Medical: Not on file    Non-medical: Not on file  Tobacco Use  . Smoking status: Never Smoker  . Smokeless tobacco: Never Used  Substance and Sexual Activity  . Alcohol use: Yes    Alcohol/week: 0.0 standard drinks     Comment: occasional wine  . Drug use: No  . Sexual activity: Yes    Partners: Male    Birth control/protection: Post-menopausal    Comment: 1st intercourse 63 yo-Fewer than 5 partners  Lifestyle  . Physical activity    Days per week: Not on file    Minutes per session: Not on file  . Stress: Not on file  Relationships  . Social Herbalist on phone: Not on file    Gets together: Not on file    Attends religious service: Not on file    Active member of club or organization: Not on file    Attends meetings of clubs or organizations: Not on file    Relationship status: Not on file  . Intimate partner violence    Fear of current or ex partner: Not on file    Emotionally abused: Not on file    Physically abused: Not on file    Forced sexual activity: Not on file  Other Topics Concern  . Not on file  Social History Narrative   Regular exercise:  Walks 2-5 x weekly   Caffeine Use: no   Widowed   She has a son [name]- he is 70 yrs old.   Foster parent.   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  Dog (Lab named Abby)          Past Surgical History:  Procedure Laterality Date  . BIOPSY THYROID    . BREAST BIOPSY  04/24/11   rigth breast  . BREAST EXCISIONAL BIOPSY    . BREAST LUMPECTOMY WITH RADIOACTIVE SEED LOCALIZATION Left 05/01/2017   Procedure: LEFT BREAST LUMPECTOMY WITH RADIOACTIVE SEED LOCALIZATION;  Surgeon: Donnie Mesa, MD;  Location: [place];  Service: General;  Laterality: Left;  . CESAREAN SECTION  [id number]  . COLONOSCOPY    . TUBAL LIGATION    . UPPER GASTROINTESTINAL ENDOSCOPY      Family History  Problem Relation Age of Onset  . Lymphoma Mother   . Dementia Mother   . Cancer Father        lung  . Colon cancer Maternal Uncle   . Esophageal cancer Neg Hx   . Pancreatic cancer Neg Hx   . Rectal cancer Neg Hx   . Stomach cancer Neg Hx   . Thyroid disease Neg Hx      Allergies  Allergen Reactions  . Naproxen Nausea And Vomiting    REACTION: vomiting  . Ropinirole Nausea Only  . Vicodin [Hydrocodone-Acetaminophen] Nausea And Vomiting  . Propoxyphene N-Acetaminophen Nausea And Vomiting    Current Outpatient Medications on File Prior to Visit  Medication Sig Dispense Refill  . amLODipine (NORVASC) 10 MG tablet TAKE 1 TABLET DAILY 90 tablet 1  . benzonatate (TESSALON) 100 MG capsule Take 1 capsule (100 mg total) by mouth 3 (three) times daily as needed. 20 capsule 0  . blood glucose meter kit and supplies KIT Dispense based on patient and insurance preference. Check sugar 1-2 times daily.  (FOR ICD-9 250.00, 250.) 1 each 0  . Esomeprazole Magnesium (NEXIUM 24HR) 20 MG TBEC Take 1 tablet by mouth daily. 30 tablet   . loratadine (CLARITIN) 10 MG tablet Take 1 tablet (10 mg total) by mouth daily. (Patient taking differently: Take 10 mg by mouth daily as needed. ) 30 tablet 2  . meloxicam (MOBIC) 7.5 MG tablet Take 1 tablet (7.5 mg total) by mouth daily as needed for pain. For back pain 20 tablet 0   No current facility-administered medications on file prior to visit.     LMP 02/28/2007     Observations/Objective:   Gen: Awake, alert, no acute distress Resp: Breathing is even and non-labored Psych: calm/pleasant demeanor Neuro: Alert and Oriented x 3, + facial symmetry, speech is clear.   Assessment and Plan:   Viral URI-  covid screening is negative.  Advised pt since she does not have any known covid exposure, covid-19 test is negative and her only symptom is nasal congestion, I think it is reasonable for her to return to work. She plans to wear a mask at work. She understands that if she develops new/worsening symptoms she should take herself immediately out of work and follow back up with me. She verbalizes understanding.   Follow Up Instructions:    I discussed the assessment and treatment plan with the patient. The patient was  provided an opportunity to ask questions and all were answered. The patient agreed with the plan and demonstrated an understanding of the instructions.   The patient was advised to call back or seek an in-person evaluation if the symptoms worsen or if the condition fails to improve as anticipated.  Nance Pear, NP

## 2019-01-27 NOTE — Telephone Encounter (Signed)
Please contact pt to schedule virtual with me today.

## 2019-03-03 ENCOUNTER — Other Ambulatory Visit: Payer: Self-pay | Admitting: Family

## 2019-03-03 ENCOUNTER — Other Ambulatory Visit: Payer: Self-pay | Admitting: Emergency Medicine

## 2019-03-03 DIAGNOSIS — Z1231 Encounter for screening mammogram for malignant neoplasm of breast: Secondary | ICD-10-CM

## 2019-03-18 ENCOUNTER — Encounter: Payer: Self-pay | Admitting: Family

## 2019-03-18 ENCOUNTER — Ambulatory Visit (INDEPENDENT_AMBULATORY_CARE_PROVIDER_SITE_OTHER): Admitting: Family

## 2019-03-18 ENCOUNTER — Other Ambulatory Visit: Payer: Self-pay

## 2019-03-18 VITALS — BP 126/63 | HR 66 | Temp 97.5°F | Resp 16 | Ht 67.0 in | Wt 222.4 lb

## 2019-03-18 DIAGNOSIS — E1129 Type 2 diabetes mellitus with other diabetic kidney complication: Secondary | ICD-10-CM

## 2019-03-18 DIAGNOSIS — R809 Proteinuria, unspecified: Secondary | ICD-10-CM

## 2019-03-18 DIAGNOSIS — F4321 Adjustment disorder with depressed mood: Secondary | ICD-10-CM | POA: Diagnosis not present

## 2019-03-18 DIAGNOSIS — I10 Essential (primary) hypertension: Secondary | ICD-10-CM

## 2019-03-18 DIAGNOSIS — K219 Gastro-esophageal reflux disease without esophagitis: Secondary | ICD-10-CM | POA: Diagnosis not present

## 2019-03-18 LAB — COMPREHENSIVE METABOLIC PANEL
ALT: 15 U/L (ref 0–35)
AST: 17 U/L (ref 0–37)
Albumin: 4.3 g/dL (ref 3.5–5.2)
Alkaline Phosphatase: 62 U/L (ref 39–117)
BUN: 11 mg/dL (ref 6–23)
CO2: 30 mEq/L (ref 19–32)
Calcium: 9.9 mg/dL (ref 8.4–10.5)
Chloride: 105 mEq/L (ref 96–112)
Creatinine, Ser: 0.87 mg/dL (ref 0.40–1.20)
GFR: 79.73 mL/min (ref >60.00)
Glucose, Bld: 130 mg/dL — ABNORMAL HIGH (ref 70–99)
Potassium: 4 mEq/L (ref 3.5–5.1)
Sodium: 140 mEq/L (ref 135–145)
Total Bilirubin: 0.8 mg/dL (ref 0.2–1.2)
Total Protein: 7.1 g/dL (ref 6.0–8.3)

## 2019-03-18 LAB — HEMOGLOBIN A1C: Hgb A1c MFr Bld: 6.3 % (ref 4.6–6.5)

## 2019-03-18 LAB — MICROALBUMIN / CREATININE URINE RATIO
Creatinine,U: 223.7 mg/dL
Microalb Creat Ratio: 1 mg/g (ref 0.0–30.0)
Microalb, Ur: 2.2 mg/dL — ABNORMAL HIGH (ref 0.0–1.9)

## 2019-03-18 LAB — LIPID PANEL
Cholesterol: 175 mg/dL (ref 0–200)
HDL: 58.1 mg/dL (ref >39.00)
LDL Cholesterol: 102 mg/dL — ABNORMAL HIGH (ref 0–99)
NonHDL: 117.02
Total CHOL/HDL Ratio: 3
Triglycerides: 74 mg/dL (ref 0.0–149.0)
VLDL: 14.8 mg/dL (ref 0.0–40.0)

## 2019-03-18 MED ORDER — LISINOPRIL 2.5 MG PO TABS: 2.5000 mg | ORAL_TABLET | Freq: Every day | ORAL | 1 refills | Status: DC

## 2019-03-18 MED ORDER — AMLODIPINE BESYLATE 10 MG PO TABS: 10.0000 mg | ORAL_TABLET | Freq: Every day | ORAL | 1 refills | Status: DC

## 2019-03-18 NOTE — Patient Instructions (Signed)
Please complete lab work prior to leaving.  Add lisinopril 2.5mg  once daily for blood pressure.

## 2019-03-18 NOTE — Progress Notes (Signed)
Subjective:    Patient ID: Nancy Deleon, female    DOB: 1956/01/02, 63 y.o.   MRN: [id number]  HPI  Patient is a 62 yr old female who presents today for follow up.  HTN- pt is maintained on amlodipine 72m. BP Readings from Last 3 Encounters:  03/18/19 126/63  10/09/18 120/78  09/16/18 120/60   GERD- Reports that her symptoms are well controlled without medication.   DM2- Reports that she has been working on a healthy diet.  Lab Results  Component Value Date   HGBA1C 6.1 09/16/2018   HGBA1C 6.6 (H) 06/11/2018   HGBA1C 6.9 (H) 03/13/2018   Lab Results  Component Value Date   MICROALBUR 3.4 (H) 06/11/2018   LDLCALC 111 (H) 09/16/2018   CREATININE 0.87 09/16/2018   Grief- pt lost her mom on 01/08/19. 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  Review of Systems See HPI  Past Medical History:  Diagnosis Date  . Allergy   . Fatty liver 04/03/2014  . GERD (gastroesophageal reflux disease)   . Hypertension   . 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 Left breast mass   . Multiple thyroid nodules 08/20/2017  . Obesity   . Type 2 diabetes mellitus without complications ([place] 85/0/9326 . Type 2 diabetes mellitus, controlled, with renal complications ([facility] 87/08/2456    Social History   Socioeconomic History  . Marital status: Widowed    Spouse name: Not on file  . Number of children: 2  . Years of education: Not on file  . Highest education level: Not on file  Occupational History    Employer: [employer]  Social Needs  . Financial resource strain: Not on file  . Food insecurity    Worry: Not on file    Inability: Not on file  . Transportation needs    Medical: Not on file    Non-medical: Not on file  Tobacco Use  . Smoking status: Never Smoker  . Smokeless tobacco: Never Used  Substance and Sexual Activity  . Alcohol use: Yes    Alcohol/week: 0.0 standard drinks    Comment: occasional wine  . Drug use:  No  . Sexual activity: Yes    Partners: Male    Birth control/protection: Post-menopausal    Comment: 1st intercourse 63yo-Fewer than 5 partners  Lifestyle  . Physical activity    Days per week: Not on file    Minutes per session: Not on file  . Stress: Not on file  Relationships  . Social [place] phone: Not on file    Gets together: Not on file    Attends religious service: Not on file    Active member of club or organization: Not on file    Attends meetings of clubs or organizations: Not on file    Relationship status: Not on file  . Intimate partner violence    Fear of current or ex partner: Not on file    Emotionally abused: Not on file    Physically abused: Not on file    Forced sexual activity: Not on file  Other Topics Concern  . Not on file  Social History Narrative   Regular exercise:  Walks 2-5 x weekly   Caffeine Use: no   Widowed   She has a son JJeneen Rinks he is 273yrs old.   Foster parent.   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   Dog (Lab named Abby)          Past Surgical History:  Procedure Laterality Date  . BIOPSY THYROID    . BREAST BIOPSY  04/24/11   rigth breast  . BREAST EXCISIONAL BIOPSY    . BREAST LUMPECTOMY WITH RADIOACTIVE SEED LOCALIZATION Left 05/01/2017   Procedure: LEFT BREAST LUMPECTOMY WITH RADIOACTIVE SEED LOCALIZATION;  Surgeon: Donnie Mesa, MD;  Location: [place];  Service: General;  Laterality: Left;  . CESAREAN SECTION  [id number]  . COLONOSCOPY    . TUBAL LIGATION    . UPPER GASTROINTESTINAL ENDOSCOPY      Family History  Problem Relation Age of Onset  . Lymphoma Mother   . Dementia Mother   . Cancer Father        lung  . Colon cancer Maternal Uncle   . Esophageal cancer Neg Hx   . Pancreatic cancer Neg Hx   . Rectal cancer Neg Hx   . Stomach cancer Neg Hx   . Thyroid disease Neg Hx     Allergies  Allergen Reactions  .  Naproxen Nausea And Vomiting    REACTION: vomiting  . Ropinirole Nausea Only  . Vicodin [Hydrocodone-Acetaminophen] Nausea And Vomiting  . Propoxyphene N-Acetaminophen Nausea And Vomiting    Current Outpatient Medications on File Prior to Visit  Medication Sig Dispense Refill  . amLODipine (NORVASC) 10 MG tablet TAKE 1 TABLET DAILY 90 tablet 1  . blood glucose meter kit and supplies KIT Dispense based on patient and insurance preference. Check sugar 1-2 times daily.  (FOR ICD-9 250.00, 250.) 1 each 0  . calcium-vitamin D (OSCAL WITH D) 500-200 MG-UNIT tablet Take 1 tablet by mouth.    . Esomeprazole Magnesium (NEXIUM 24HR) 20 MG TBEC Take 1 tablet by mouth daily. (Patient not taking: Reported on 03/18/2019) 30 tablet    No current facility-administered medications on file prior to visit.     BP 126/63 (BP Location: Right Arm, Patient Position: Sitting, Cuff Size: Large)   Pulse 66   Temp (!) 97.5 F (36.4 C) (Oral)   Resp 16   Ht 5' 7"  (1.702 m)   Wt 222 lb 6.4 oz (100.9 kg)   LMP 02/28/2007   SpO2 100%   BMI 34.83 kg/m       Objective:   Physical Exam Constitutional:      Appearance: She is well-developed.  Neck:     Musculoskeletal: Neck supple.     Thyroid: No thyromegaly.  Cardiovascular:     Rate and Rhythm: Normal rate and regular rhythm.     Heart sounds: Normal heart sounds. No murmur.  Pulmonary:     Effort: Pulmonary effort is normal. No respiratory distress.     Breath sounds: Normal breath sounds. No wheezing.  Skin:    General: Skin is warm and dry.  Neurological:     Mental Status: She is alert and oriented to person, place, and time.  Psychiatric:        Behavior: Behavior normal.        Thought Content: Thought content normal.        Judgment: Judgment normal.           Assessment & Plan:  HTN- bp stable on amlodipine, continue same.  DM2- (+ microalbuminuria)  Restart lisinopril 2.62m for renal protection.  GERD- stable off of PPI,  monitor.  Grief reaction- improving, continue counseling.

## 2019-03-19 IMAGING — US US FNA BIOPSY THYROID 1ST LESION
1 series · 13 of 23 positions shown · non-contrast
Comparison: US Thyroid 08/16/2017

MEDICATIONS:
5 cc 1% lidocaine

COMPLICATIONS:
None immediate.

INDICATION: Indeterminate thyroid nodule

Right inferior nodule 2.1 cm
Today's imaging was reviewed with Dr Vava
Right mid thyroid and left superior thyroid imaging were determined
to be pseudonodules---
No biopsies performed
EXAM:
ULTRASOUND GUIDED FINE NEEDLE ASPIRATION OF INDETERMINATE THYROID
NODULE
TECHNIQUE: Informed written consent was obtained from the patient after a
discussion of the risks, benefits and alternatives to treatment.
Questions regarding the procedure were encouraged and answered. A
timeout was performed prior to the initiation of the procedure.

[Series 1: us fna biopsy thyroid 1st lesion · 0.07mm/px · 23 acquisitions, 13 frames shown]
[im 1/23]
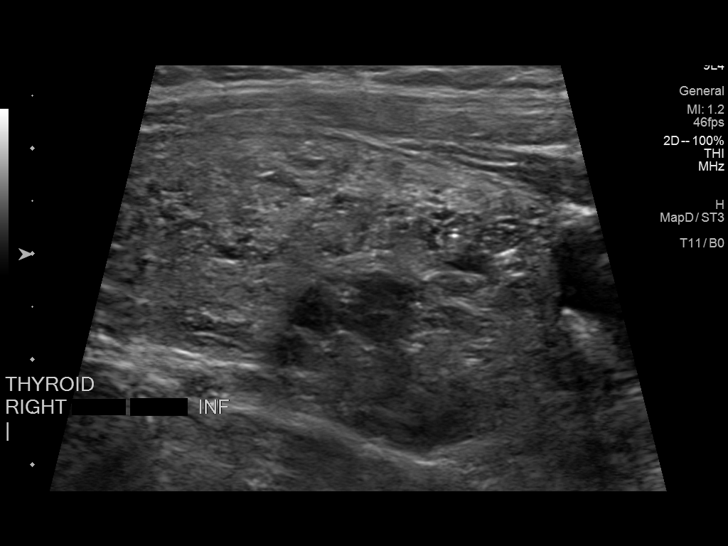
[im 3/23]
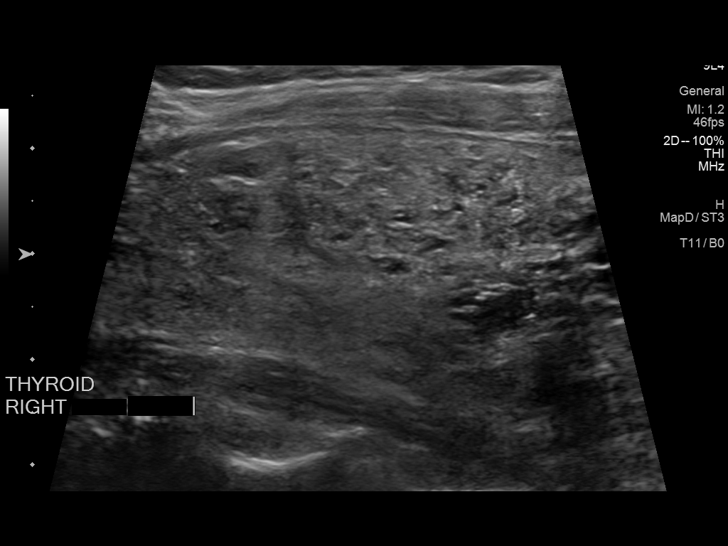
[im 5/23]
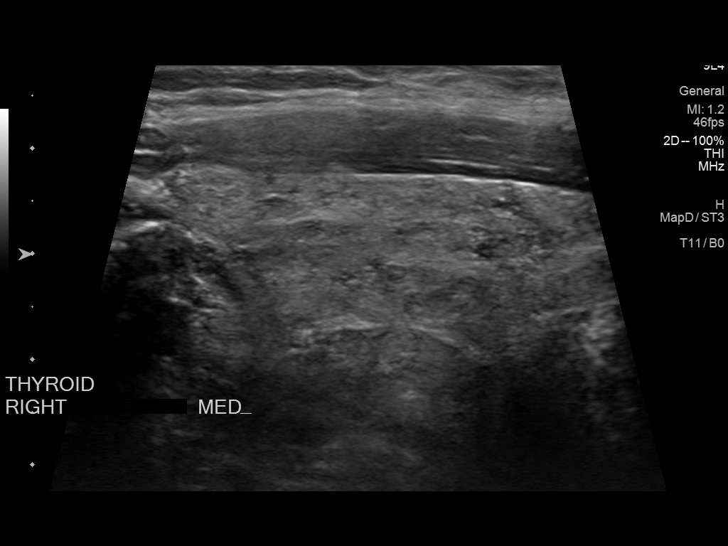
[im 7/23]
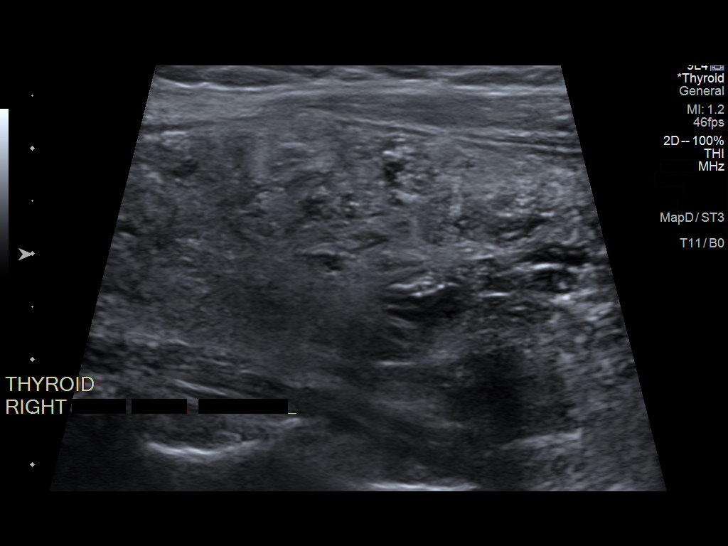
[im 8/23]
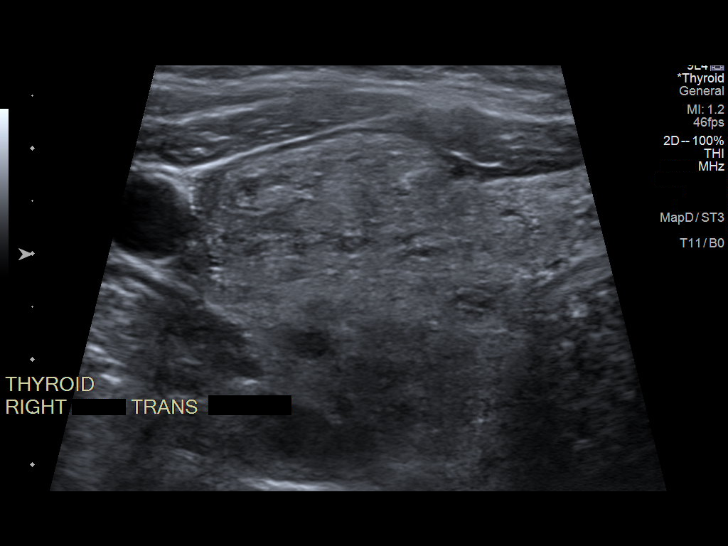
[im 10/23]
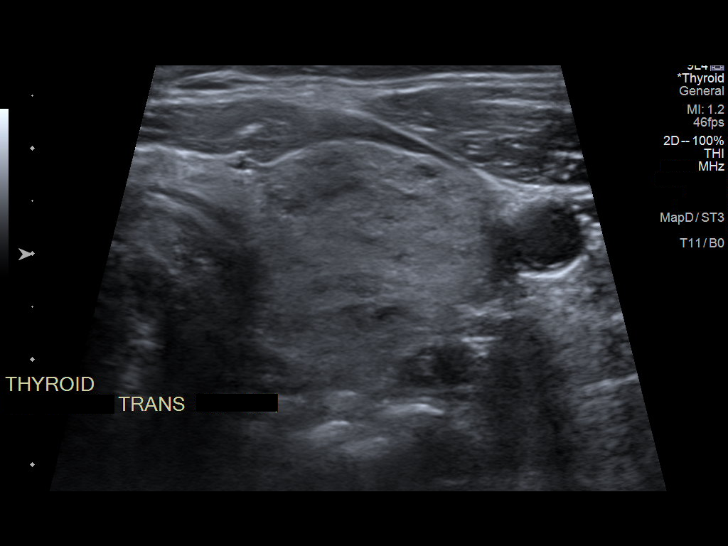
[im 12/23]
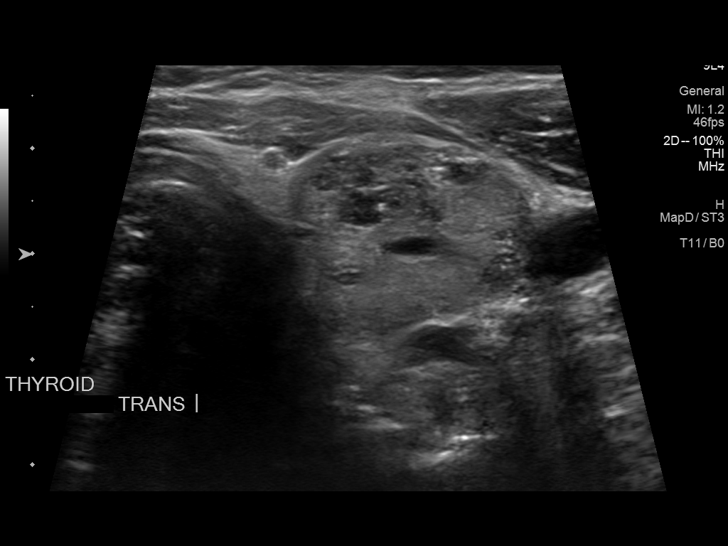
[im 14/23]
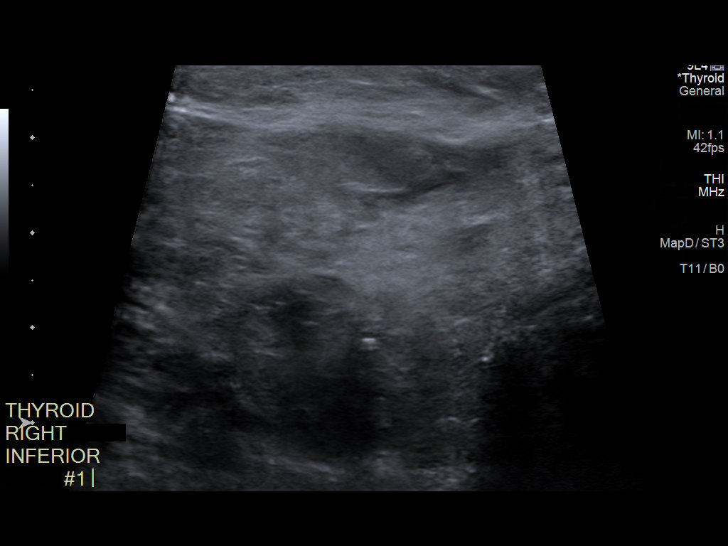
[im 16/23]
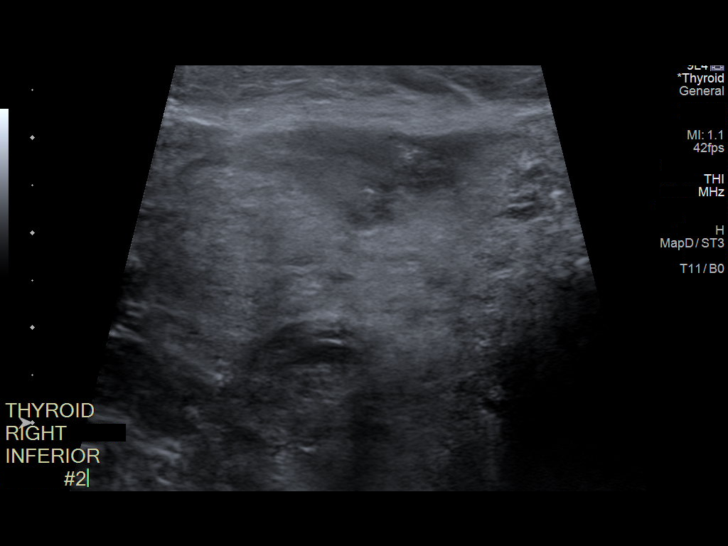
[im 17/23]
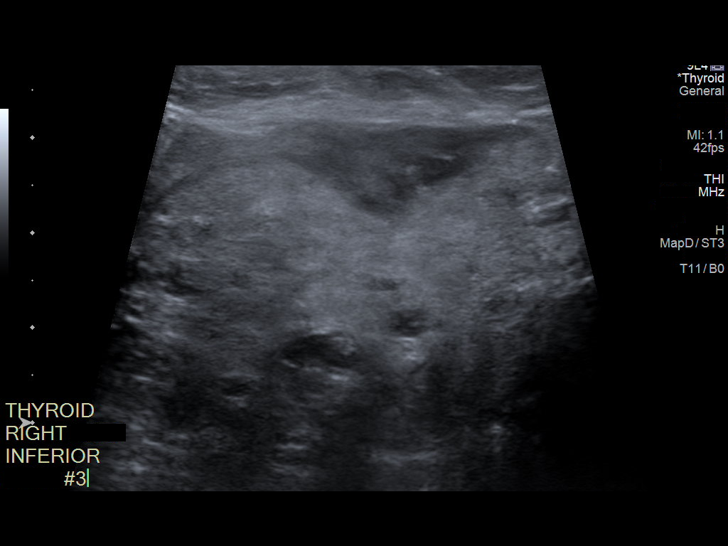
[im 19/23]
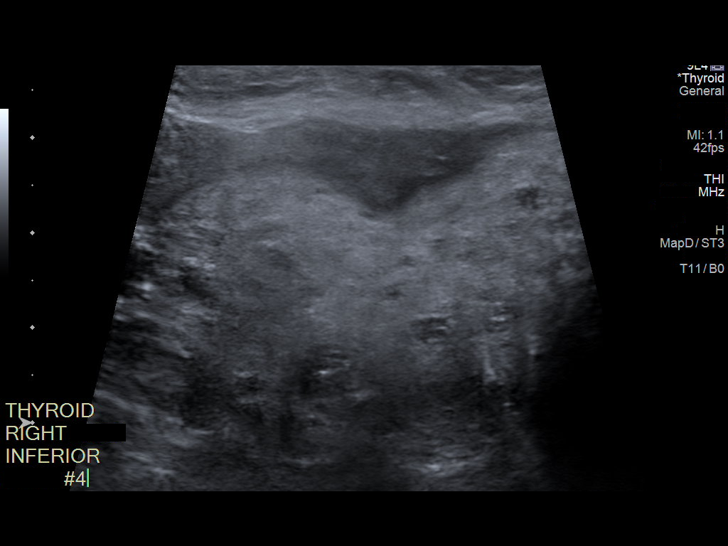
[im 21/23]
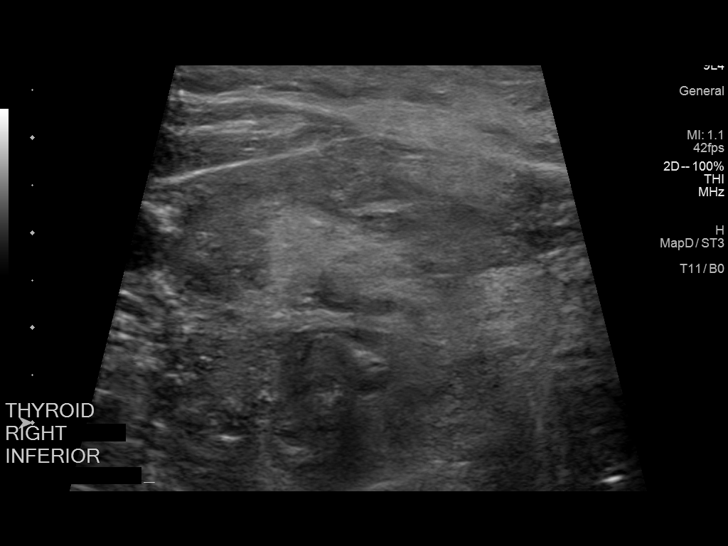
[im 23/23]
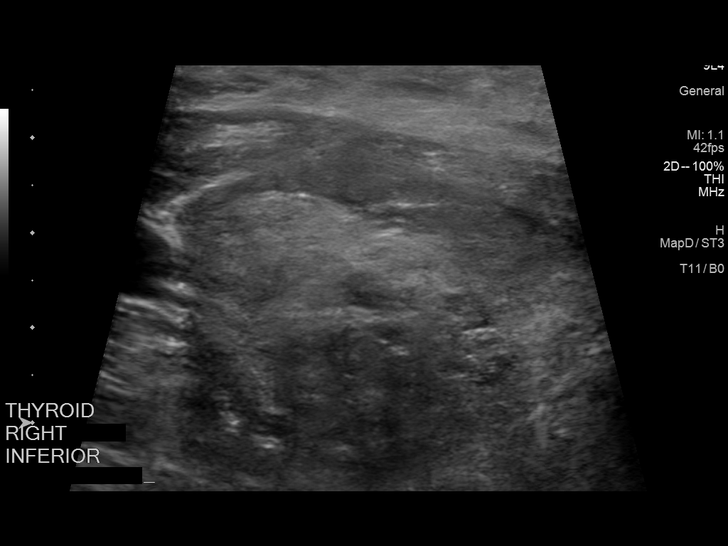

[13 of 23 positions shown; findings below may reference images not displayed]

Pre-procedural ultrasound scanning demonstrated unchanged size and
appearance of the indeterminate nodule within the right inferior
thyroid

The procedure was planned. The neck was prepped in the usual sterile
fashion, and a sterile drape was applied covering the operative
field. A timeout was performed prior to the initiation of the
procedure. Local anesthesia was provided with 1% lidocaine.

Under direct ultrasound guidance, 4 FNA biopsies were performed of
the right inferior thyroid nodule with a 25 gauge needle. Multiple
ultrasound images were saved for procedural documentation purposes.
The samples were prepared and submitted to pathology.

Limited post procedural scanning was negative for hematoma or
additional complication. Dressings were placed. The patient
tolerated the above procedures procedure well without immediate
postprocedural complication.
FINDINGS: Nodule reference number based on prior diagnostic ultrasound: 3

Maximum size:

Location: Right; Inferior

ACR TI-RADS risk category: TR4 (4-6 points)

Reason for biopsy: meets ACR TI-RADS criteria

Ultrasound imaging confirms appropriate placement of the needles
within the thyroid nodule.
IMPRESSION: Technically successful ultrasound guided fine needle aspiration of
right inferior thyroid nodule

Read by

Linden Corea

## 2019-03-20 ENCOUNTER — Telehealth: Payer: Self-pay | Admitting: Family

## 2019-03-20 ENCOUNTER — Encounter: Payer: Self-pay | Admitting: Family

## 2019-03-20 MED ORDER — ATORVASTATIN CALCIUM 10 MG PO TABS: 10.0000 mg | ORAL_TABLET | Freq: Every day | ORAL | 5 refills | Status: DC

## 2019-03-20 NOTE — Telephone Encounter (Signed)
See mychart.  

## 2019-03-21 ENCOUNTER — Ambulatory Visit (INDEPENDENT_AMBULATORY_CARE_PROVIDER_SITE_OTHER): Admitting: Family

## 2019-03-21 DIAGNOSIS — R809 Proteinuria, unspecified: Secondary | ICD-10-CM | POA: Diagnosis not present

## 2019-03-21 DIAGNOSIS — E1129 Type 2 diabetes mellitus with other diabetic kidney complication: Secondary | ICD-10-CM

## 2019-03-21 NOTE — Progress Notes (Signed)
Virtual Visit via Video Note  I connected with Nancy Deleon on 03/21/19 at  7:20 AM EDT by a video enabled telemedicine application and verified that I am speaking with the correct person using two identifiers. (initially connected via video but pt had audio issues so I switched her to a telephone visit)  Location: Patient: home Provider: office   I discussed the limitations of evaluation and management by telemedicine and the availability of in person appointments. The patient expressed understanding and agreed to proceed.  History of Present Illness:   Patient is s 63 yr old female who presents today to discuss lab results.  Hyperlipidemia-  Lab Results  Component Value Date   CHOL 175 03/18/2019   HDL 58.10 03/18/2019   LDLCALC 102 (H) 03/18/2019   TRIG 74.0 03/18/2019   CHOLHDL 3 03/18/2019   DM2-  Lab Results  Component Value Date   HGBA1C 6.3 03/18/2019   HGBA1C 6.1 09/16/2018   HGBA1C 6.6 (H) 06/11/2018   Lab Results  Component Value Date   MICROALBUR 2.2 (H) 03/18/2019   LDLCALC 102 (H) 03/18/2019   CREATININE 0.87 03/18/2019   HTN-  BP Readings from Last 3 Encounters:  03/18/19 126/63  10/09/18 120/78  09/16/18 120/60   Past Medical History:  Diagnosis Date  . Allergy   . Fatty liver 04/03/2014  . GERD (gastroesophageal reflux disease)   . Hypertension   . 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 Left breast mass   . Multiple thyroid nodules 08/20/2017  . Obesity   . Type 2 diabetes mellitus without complications ([place]) 08/12/1094  . Type 2 diabetes mellitus, controlled, with renal complications ([place]) 0/11/5407     Social History   Socioeconomic History  . Marital status: Widowed    Spouse name: Not on file  . Number of children: 2  . Years of education: Not on file  . Highest education level: Not on file  Occupational History    Employer: [employer]  Social Needs  . Financial resource strain: Not on file  . Food insecurity     Worry: Not on file    Inability: Not on file  . Transportation needs    Medical: Not on file    Non-medical: Not on file  Tobacco Use  . Smoking status: Never Smoker  . Smokeless tobacco: Never Used  Substance and Sexual Activity  . Alcohol use: Yes    Alcohol/week: 0.0 standard drinks    Comment: occasional wine  . Drug use: No  . Sexual activity: Yes    Partners: Male    Birth control/protection: Post-menopausal    Comment: 1st intercourse 63 yo-Fewer than 5 partners  Lifestyle  . Physical activity    Days per week: Not on file    Minutes per session: Not on file  . Stress: Not on file  Relationships  . Social Herbalist on phone: Not on file    Gets together: Not on file    Attends religious service: Not on file    Active member of club or organization: Not on file    Attends meetings of clubs or organizations: Not on file    Relationship status: Not on file  . Intimate partner violence    Fear of current or ex partner: Not on file    Emotionally abused: Not on file    Physically abused: Not on file    Forced sexual activity: Not on file  Other Topics Concern  .  Not on file  Social History Narrative   Regular exercise:  Walks 2-5 x weekly   Caffeine Use: no   Widowed   She has a son [name]- he is 41 yrs old.   Foster parent.   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   Dog (Lab named Abby)          Past Surgical History:  Procedure Laterality Date  . BIOPSY THYROID    . BREAST BIOPSY  04/24/11   rigth breast  . BREAST EXCISIONAL BIOPSY    . BREAST LUMPECTOMY WITH RADIOACTIVE SEED LOCALIZATION Left 05/01/2017   Procedure: LEFT BREAST LUMPECTOMY WITH RADIOACTIVE SEED LOCALIZATION;  Surgeon: Donnie Mesa, MD;  Location: [place];  Service: General;  Laterality: Left;  . CESAREAN SECTION  [id number]  . COLONOSCOPY    . TUBAL LIGATION    . UPPER GASTROINTESTINAL ENDOSCOPY       Family History  Problem Relation Age of Onset  . Lymphoma Mother   . Dementia Mother   . Cancer Father        lung  . Colon cancer Maternal Uncle   . Esophageal cancer Neg Hx   . Pancreatic cancer Neg Hx   . Rectal cancer Neg Hx   . Stomach cancer Neg Hx   . Thyroid disease Neg Hx     Allergies  Allergen Reactions  . Naproxen Nausea And Vomiting    REACTION: vomiting  . Ropinirole Nausea Only  . Vicodin [Hydrocodone-Acetaminophen] Nausea And Vomiting  . Propoxyphene N-Acetaminophen Nausea And Vomiting    Current Outpatient Medications on File Prior to Visit  Medication Sig Dispense Refill  . amLODipine (NORVASC) 10 MG tablet Take 1 tablet (10 mg total) by mouth daily. 90 tablet 1  . atorvastatin (LIPITOR) 10 MG tablet Take 1 tablet (10 mg total) by mouth daily. 30 tablet 5  . blood glucose meter kit and supplies KIT Dispense based on patient and insurance preference. Check sugar 1-2 times daily.  (FOR ICD-9 250.00, 250.) 1 each 0  . calcium-vitamin D (OSCAL WITH D) 500-200 MG-UNIT tablet Take 1 tablet by mouth.    Marland Kitchen lisinopril (ZESTRIL) 2.5 MG tablet Take 1 tablet (2.5 mg total) by mouth daily. 90 tablet 1   No current facility-administered medications on file prior to visit.     LMP 02/28/2007    Observations/Objective:   Gen: Awake, alert, no acute distress Resp: Breathing is even and non-labored Psych: calm/pleasant demeanor Neuro: Alert and Oriented x 3, + facial symmetry, speech is clear.   Assessment and Plan:  Diabetes type 2- although her lipid panel looks good I plugged it into the cardiovascular risk calculator and her calculated risk of heart attack or stroke in the next 10 years is 14.6%.  Since this is greater than the 7.5% threshold I have recommended that she begin a atorvastatin.  We discussed that this will help to decrease her future risk of cardiovascular disease.  We also discussed her proteinuria and that this is likely secondary to her  longstanding history of hypertension as well as diabetes.  Discussed importance of strict blood pressure control and strict strict glucose control.  Also discussed role of ACE inhibitor and renal protection.  She verbalizes understanding.  She would like a referral to a nutritionist.  Referral has been placed.   A total of 15 minutes were spent  with the patient during this  encounter and over half of that time was spent on counseling and coordination of care. The patient was counseled on information as above.    Follow Up Instructions:    I discussed the assessment and treatment plan with the patient. The patient was provided an opportunity to ask questions and all were answered. The patient agreed with the plan and demonstrated an understanding of the instructions.   The patient was advised to call back or seek an in-person evaluation if the symptoms worsen or if the condition fails to improve as anticipated.  Nance Pear, NP

## 2019-04-18 ENCOUNTER — Other Ambulatory Visit: Payer: Self-pay

## 2019-04-18 ENCOUNTER — Ambulatory Visit
Admission: RE | Admit: 2019-04-18 | Discharge: 2019-04-18 | Disposition: A | Discharge status: Home / Self Care | Source: Ambulatory Visit | Attending: Family | Admitting: Family

## 2019-04-18 DIAGNOSIS — Z1231 Encounter for screening mammogram for malignant neoplasm of breast: Secondary | ICD-10-CM

## 2019-04-23 ENCOUNTER — Encounter: Payer: Self-pay | Admitting: Dietician

## 2019-04-23 ENCOUNTER — Encounter: Attending: Family | Admitting: Dietician

## 2019-04-23 ENCOUNTER — Other Ambulatory Visit: Payer: Self-pay

## 2019-04-23 DIAGNOSIS — R809 Proteinuria, unspecified: Secondary | ICD-10-CM | POA: Insufficient documentation

## 2019-04-23 DIAGNOSIS — E1129 Type 2 diabetes mellitus with other diabetic kidney complication: Secondary | ICD-10-CM | POA: Diagnosis present

## 2019-04-23 NOTE — Progress Notes (Signed)
Diabetes Self-Management Education  Visit Type: First/Initial  Appt. Start Time: 9:00am Appt. End Time: 10:05am  04/23/2019  Nancy Deleon, identified by name and date of birth, is a 63 y.o. female with a diagnosis of Diabetes: Type 2.   ASSESSMENT  Primary concerns today include weight management and help with eating habits.   Patient states she thoroughly enjoys being active and has no problem with this. Physical activity includes lots of walking throughout the week. States her work schedule can be pretty busy, working 12 hour days at [employer]. Lives alone.   States she eats out sometimes, since it can be difficult to cook for just herself. Does not eat a lot of red meat, usually eats chicken and lots of seafood. Usually baked, rarely eats fried foods. Likes vegetables and states she is not a picky eater, tries to eat a green leafy vegetable per day. Not a sweets eater, however does like sweet tea and sometimes drinks regular sodas. States she is lactose intolerant so drinks almond milk. Loves snacking, states she is addicted to potato chips. Also loves pasta. Estimated fluid intake includes 2 bottles (~32 oz) water plus other beverages.   HgbA1c Hx 6.3% (03/18/2019) 6.1% (09/16/2018) 6.6% (06/11/2018) 6.9% (03/13/2018)   Diabetes Self-Management Education - 04/23/19 0858      Visit Information   Visit Type  First/Initial      Initial Visit   Diabetes Type  Type 2    Are you currently following a meal plan?  No    Are you taking your medications as prescribed?  Not on Medications    Date Diagnosed  November 2019      Health Coping   How would you rate your overall health?  Good      Psychosocial Assessment   Self-care barriers  None    Other persons present  Patient    Patient Concerns  Nutrition/Meal planning;Weight Control    Special Needs  None    Preferred Learning Style  No preference indicated    Learning Readiness  Ready    How often do you need to have someone help  you when you read instructions, pamphlets, or other written materials from your doctor or pharmacy?  1 - Never    What is the last grade level you completed in school?  "14 years"      Complications   Last HgB A1C per patient/outside source  6.3 %   03/18/2019   How often do you check your blood sugar?  0 times/day (not testing)    Have you had a dilated eye exam in the past 12 months?  Yes    Have you had a dental exam in the past 12 months?  Yes      Dietary Intake   Breakfast  cereal + almond milk    Snack (morning)  [employer] english muffin with sausage    Lunch  baked chicken + sides    Snack (afternoon)  chips    Dinner  baked fish + rice + green beans    Snack (evening)  chips    Beverage(s)  water, sweet tea, almond milk, soda (sometimes)      Exercise   Exercise Type  ADL's;Light (walking / raking leaves)      Patient Education   Disease state   Definition of diabetes, type 1 and 2, and the diagnosis of diabetes;Explored patient's options for treatment of their diabetes    Nutrition management   Role of diet  in the treatment of diabetes and the relationship between the three main macronutrients and blood glucose level;Food label reading, portion sizes and measuring food.;Carbohydrate counting;Effects of alcohol on blood glucose and safety factors with consumption of alcohol.    Physical activity and exercise   Role of exercise on diabetes management, blood pressure control and cardiac health.      Individualized Goals (developed by patient)   Nutrition  Follow meal plan discussed    Medications  Not Applicable    Monitoring   Not Applicable      Outcomes   Expected Outcomes  Demonstrated interest in learning. Expect positive outcomes    Future DMSE  4-6 wks    Program Status  Not Completed       Individualized Plan for Diabetes Self-Management Training:   Learning Objective:  Patient will have a greater understanding of diabetes self-management. Patient education  plan is to attend individual and/or group sessions per assessed needs and concerns.   Plan:  Patient Instructions  Remember your goals:   Monitor portion sizes by reading the Nutrition Facts labels (pay attention to the serving size), eating off of smaller plates, measuring out foods, and using Tupper ware containers.   Try to always eat some protein when you eat carbohydrates. Use the "Balanced Snack" sheet for good snack ideas that have both carbohydrates and protein.     Expected Outcomes:  Demonstrated interest in learning. Expect positive outcomes  Education material provided: My Plate, Snack sheet and Carbohydrate counting sheet, Types of Diabetes & BG Control, Types of Sugar  If problems or questions, patient to contact team via:  Phone and Email  RD's Notes for Next Visit:   Meal Ideas  Tips for Eating Out  Fluid Intake (increase water)   Future DSME appointment: 4-6 wks

## 2019-04-23 NOTE — Patient Instructions (Addendum)
Remember your goals:   Monitor portion sizes by reading the Nutrition Facts labels (pay attention to the serving size), eating off of smaller plates, measuring out foods, and using Tupper ware containers.   Try to always eat some protein when you eat carbohydrates. Use the "Balanced Snack" sheet for good snack ideas that have both carbohydrates and protein.

## 2019-05-06 ENCOUNTER — Encounter: Payer: Self-pay | Admitting: Gynecology

## 2019-06-04 ENCOUNTER — Ambulatory Visit: Admitting: Dietician

## 2019-06-24 ENCOUNTER — Telehealth (INDEPENDENT_AMBULATORY_CARE_PROVIDER_SITE_OTHER): Admitting: Family

## 2019-06-24 ENCOUNTER — Other Ambulatory Visit: Payer: Self-pay

## 2019-06-24 ENCOUNTER — Encounter: Payer: Self-pay | Admitting: Family

## 2019-06-24 DIAGNOSIS — I10 Essential (primary) hypertension: Secondary | ICD-10-CM

## 2019-06-24 DIAGNOSIS — F4321 Adjustment disorder with depressed mood: Secondary | ICD-10-CM

## 2019-06-24 DIAGNOSIS — R809 Proteinuria, unspecified: Secondary | ICD-10-CM

## 2019-06-24 DIAGNOSIS — E1129 Type 2 diabetes mellitus with other diabetic kidney complication: Secondary | ICD-10-CM

## 2019-06-24 DIAGNOSIS — E785 Hyperlipidemia, unspecified: Secondary | ICD-10-CM

## 2019-06-24 MED ORDER — ATORVASTATIN CALCIUM 10 MG PO TABS: 10.0000 mg | ORAL_TABLET | Freq: Every day | ORAL | 1 refills | Status: DC

## 2019-06-24 MED ORDER — LISINOPRIL 2.5 MG PO TABS: 2.5000 mg | ORAL_TABLET | Freq: Every day | ORAL | 1 refills | Status: DC

## 2019-06-24 MED ORDER — AMLODIPINE BESYLATE 10 MG PO TABS: 10.0000 mg | ORAL_TABLET | Freq: Every day | ORAL | 1 refills | Status: DC

## 2019-06-24 NOTE — Progress Notes (Signed)
Virtual Visit via Video Note  I connected with Nancy Deleon on 06/24/19 at 10:00 AM EST by a video enabled telemedicine application and verified that I am speaking with the correct person using two identifiers.  Location: Patient: home Provider: work   I discussed the limitations of evaluation and management by telemedicine and the availability of in person appointments. The patient expressed understanding and agreed to proceed.  History of Present Illness:  Patient is a 63 yr old female who presents today for follow up.  She continues to work through the grieving process after the passing of her mother.  She is working with a Transport planner and is finding this very helpful.     HTN- reports bp 160/60 with home wrist cuff. States that she does not trust this cuff though and will check her blood pressure at work tomorrow and send me her reading via mychart.   Meds include:  Amlodipine, lisinopril.  Doesn't trust her cuff.  She denies LE edema.   BP Readings from Last 3 Encounters:  03/18/19 126/63  10/09/18 120/78  09/16/18 120/60    DM2-  Diet controlled. Has not checked her sugars this week.   Lab Results  Component Value Date   HGBA1C 6.3 03/18/2019   HGBA1C 6.1 09/16/2018   HGBA1C 6.6 (H) 06/11/2018   Lab Results  Component Value Date   MICROALBUR 2.2 (H) 03/18/2019   LDLCALC 102 (H) 03/18/2019   CREATININE 0.87 03/18/2019   Hyperlipidemia- last visit we initiated a statin.  Lab Results  Component Value Date   CHOL 175 03/18/2019   HDL 58.10 03/18/2019   LDLCALC 102 (H) 03/18/2019   TRIG 74.0 03/18/2019   CHOLHDL 3 03/18/2019   Past Medical History:  Diagnosis Date  . Allergy   . Fatty liver 04/03/2014  . GERD (gastroesophageal reflux disease)   . Hypertension   . 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 Left breast mass   . Multiple thyroid nodules 08/20/2017  . Obesity   . Type 2 diabetes mellitus without complications ([place]) 0/10/5007  . Type 2  diabetes mellitus, controlled, with renal complications ([place]) 10/13/1827     Social History   Socioeconomic History  . Marital status: Widowed    Spouse name: Not on file  . Number of children: 2  . Years of education: Not on file  . Highest education level: Not on file  Occupational History    Employer: [employer]  Social Needs  . Financial resource strain: Not on file  . Food insecurity    Worry: Not on file    Inability: Not on file  . Transportation needs    Medical: Not on file    Non-medical: Not on file  Tobacco Use  . Smoking status: Never Smoker  . Smokeless tobacco: Never Used  Substance and Sexual Activity  . Alcohol use: Yes    Alcohol/week: 0.0 standard drinks    Comment: occasional wine  . Drug use: No  . Sexual activity: Yes    Partners: Male    Birth control/protection: Post-menopausal    Comment: 1st intercourse 63 yo-Fewer than 5 partners  Lifestyle  . Physical activity    Days per week: Not on file    Minutes per session: Not on file  . Stress: Not on file  Relationships  . Social Herbalist on phone: Not on file    Gets together: Not on file    Attends religious service: Not on  file    Active member of club or organization: Not on file    Attends meetings of clubs or organizations: Not on file    Relationship status: Not on file  . Intimate partner violence    Fear of current or ex partner: Not on file    Emotionally abused: Not on file    Physically abused: Not on file    Forced sexual activity: Not on file  Other Topics Concern  . Not on file  Social History Narrative   Regular exercise:  Walks 2-5 x weekly   Caffeine Use: no   Widowed   She has a son Nancy Deleon- he is 55 yrs old.   Foster parent.   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   Dog (Lab named Abby)          Past Surgical History:  Procedure Laterality Date  . BIOPSY THYROID    . BREAST BIOPSY   04/24/11   rigth breast  . BREAST EXCISIONAL BIOPSY    . BREAST LUMPECTOMY WITH RADIOACTIVE SEED LOCALIZATION Left 05/01/2017   Procedure: LEFT BREAST LUMPECTOMY WITH RADIOACTIVE SEED LOCALIZATION;  Surgeon: Donnie Mesa, MD;  Location: [facility];  Service: General;  Laterality: Left;  . CESAREAN SECTION  [id number]  . COLONOSCOPY    . TUBAL LIGATION    . UPPER GASTROINTESTINAL ENDOSCOPY      Family History  Problem Relation Age of Onset  . Lymphoma Mother   . Dementia Mother   . Cancer Father        lung  . Colon cancer Maternal Uncle   . Esophageal cancer Neg Hx   . Pancreatic cancer Neg Hx   . Rectal cancer Neg Hx   . Stomach cancer Neg Hx   . Thyroid disease Neg Hx     Allergies  Allergen Reactions  . Naproxen Nausea And Vomiting    REACTION: vomiting  . Ropinirole Nausea Only  . Vicodin [Hydrocodone-Acetaminophen] Nausea And Vomiting  . Propoxyphene N-Acetaminophen Nausea And Vomiting    Current Outpatient Medications on File Prior to Visit  Medication Sig Dispense Refill  . amLODipine (NORVASC) 10 MG tablet Take 1 tablet (10 mg total) by mouth daily. 90 tablet 1  . atorvastatin (LIPITOR) 10 MG tablet Take 1 tablet (10 mg total) by mouth daily. 30 tablet 5  . blood glucose meter kit and supplies KIT Dispense based on patient and insurance preference. Check sugar 1-2 times daily.  (FOR ICD-9 250.00, 250.) 1 each 0  . calcium-vitamin D (OSCAL WITH D) 500-200 MG-UNIT tablet Take 1 tablet by mouth.    Marland Kitchen lisinopril (ZESTRIL) 2.5 MG tablet Take 1 tablet (2.5 mg total) by mouth daily. 90 tablet 1   No current facility-administered medications on file prior to visit.     LMP 02/28/2007        Observations/Objective:   Gen: Awake, alert, no acute distress Resp: Breathing is even and non-labored Psych: calm/pleasant demeanor Neuro: Alert and Oriented x 3, + facial symmetry, speech is clear.   Assessment and Plan:  HTN- will await her bp recheck  tomorrow before I make any recommendations. Continue amlodipine, lisinopril.  DM2- clinically stable on diet alone. Will obtain A1C.  Hyperlipidemia- will obtain follow up lipid panel. Continue statin.  Grief reaction- continues to progress. Encouraged her to continue her counseling.     Follow Up Instructions:    I  discussed the assessment and treatment plan with the patient. The patient was provided an opportunity to ask questions and all were answered. The patient agreed with the plan and demonstrated an understanding of the instructions.   The patient was advised to call back or seek an in-person evaluation if the symptoms worsen or if the condition fails to improve as anticipated.  Nance Pear, NP

## 2019-06-26 ENCOUNTER — Encounter: Payer: Self-pay | Admitting: Family

## 2019-07-02 ENCOUNTER — Other Ambulatory Visit: Payer: Self-pay

## 2019-07-02 ENCOUNTER — Other Ambulatory Visit (INDEPENDENT_AMBULATORY_CARE_PROVIDER_SITE_OTHER)

## 2019-07-02 DIAGNOSIS — R809 Proteinuria, unspecified: Secondary | ICD-10-CM

## 2019-07-02 DIAGNOSIS — E1129 Type 2 diabetes mellitus with other diabetic kidney complication: Secondary | ICD-10-CM | POA: Diagnosis not present

## 2019-07-02 DIAGNOSIS — E785 Hyperlipidemia, unspecified: Secondary | ICD-10-CM

## 2019-07-02 LAB — LIPID PANEL
Cholesterol: 142 mg/dL (ref 0–200)
HDL: 59.5 mg/dL (ref >39.00)
LDL Cholesterol: 69 mg/dL (ref 0–99)
NonHDL: 82.27
Total CHOL/HDL Ratio: 2
Triglycerides: 68 mg/dL (ref 0.0–149.0)
VLDL: 13.6 mg/dL (ref 0.0–40.0)

## 2019-07-02 LAB — BASIC METABOLIC PANEL
BUN: 14 mg/dL (ref 6–23)
CO2: 30 mEq/L (ref 19–32)
Calcium: 9.5 mg/dL (ref 8.4–10.5)
Chloride: 105 mEq/L (ref 96–112)
Creatinine, Ser: 0.85 mg/dL (ref 0.40–1.20)
GFR: 81.83 mL/min (ref >60.00)
Glucose, Bld: 142 mg/dL — ABNORMAL HIGH (ref 70–99)
Potassium: 4 mEq/L (ref 3.5–5.1)
Sodium: 141 mEq/L (ref 135–145)

## 2019-07-02 LAB — HEMOGLOBIN A1C: Hgb A1c MFr Bld: 6.2 % (ref 4.6–6.5)

## 2019-07-09 ENCOUNTER — Encounter: Payer: Self-pay | Admitting: Family

## 2019-07-11 ENCOUNTER — Telehealth: Admitting: Family

## 2019-07-11 ENCOUNTER — Encounter: Payer: Self-pay | Admitting: Family Medicine

## 2019-07-11 ENCOUNTER — Ambulatory Visit: Admitting: Family

## 2019-07-11 ENCOUNTER — Other Ambulatory Visit: Payer: Self-pay

## 2019-07-11 ENCOUNTER — Ambulatory Visit (INDEPENDENT_AMBULATORY_CARE_PROVIDER_SITE_OTHER): Admitting: Family Medicine

## 2019-07-11 DIAGNOSIS — G2581 Restless legs syndrome: Secondary | ICD-10-CM | POA: Diagnosis not present

## 2019-07-11 MED ORDER — PRIMIDONE 50 MG PO TABS: ORAL_TABLET | ORAL | 1 refills | Status: DC

## 2019-07-11 NOTE — Progress Notes (Signed)
Musculoskeletal Exam  Patient: Nancy Deleon DOB: 1956/05/24  DOS: 07/11/2019  SUBJECTIVE:  Chief Complaint:   Chief Complaint  Patient presents with  . Leg Pain    Both legs bothering her at night, she thinks its restless legs is in her chart     [name] is a 63 y.o.  female for evaluation and treatment of b/l leg pain. Due to COVID-19 pandemic, we are interacting via web portal for an electronic face-to-face visit. I verified patient's ID using 2 identifiers. Patient agreed to proceed with visit via this method. Patient is at home, I am at office. Patient and I are present for visit.   Onset:  2 days ago. No inj or change in activity.  Location: lower calf area, outside hip area b/l; L side is worse Character:  aching  Progression of issue:  is unchanged Associated symptoms: worse at night or when she lays down; she feels the urge to move her legs and feels better, pain included, when she does; this is affecting her sleep Treatment: to date has been none.   Neurovascular symptoms: no  ROS: Musculoskeletal/Extremities: +leg pain  Past Medical History:  Diagnosis Date  . Allergy   . Fatty liver 04/03/2014  . GERD (gastroesophageal reflux disease)   . Hypertension   . 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 Multiple thyroid nodules 08/20/2017  . Obesity   . Type 2 diabetes mellitus, controlled, with renal complications ([place]) [id number]    Exam: No conversational dyspnea Age appropriate judgment and insight Nml affect and mood  Assessment:  RLS (restless legs syndrome) - Plan: primidone (MYSOLINE) 50 MG tablet, IBC + Ferritin  Plan: Looking back in her history, she had been on Requip and it gave her nausea.  We will check iron levels and start primidone.  She will take 50 mg and increase by 50 mg every 5 days if she is tolerating well.  We will keep her at 200 mg/d until she is able to follow-up. F/u for labs and then with Melissa in 3-4  weeks. The patient voiced understanding and agreement to the plan.   [place], DO 07/11/19  11:53 AM

## 2019-07-13 ENCOUNTER — Encounter: Payer: Self-pay | Admitting: Family

## 2019-07-15 ENCOUNTER — Other Ambulatory Visit: Payer: Self-pay

## 2019-07-15 ENCOUNTER — Encounter: Payer: Self-pay | Admitting: Family

## 2019-07-15 MED ORDER — GABAPENTIN 300 MG PO CAPS: 300.0000 mg | ORAL_CAPSULE | Freq: Every day | ORAL | 3 refills | Status: DC

## 2019-07-16 ENCOUNTER — Telehealth (INDEPENDENT_AMBULATORY_CARE_PROVIDER_SITE_OTHER): Admitting: Family

## 2019-07-16 DIAGNOSIS — E1142 Type 2 diabetes mellitus with diabetic polyneuropathy: Secondary | ICD-10-CM

## 2019-07-16 MED ORDER — GABAPENTIN 300 MG PO CAPS: 300.0000 mg | ORAL_CAPSULE | Freq: Three times a day (TID) | ORAL | 3 refills | Status: DC

## 2019-07-16 NOTE — Progress Notes (Signed)
Virtual Visit via Video Note  I connected with Nancy Deleon on 07/16/19 at 11:00 AM EST by a video enabled telemedicine application and verified that I am speaking with the correct person using two identifiers.  Location: Patient: home Provider: home   I discussed the limitations of evaluation and management by telemedicine and the availability of in person appointments. The patient expressed understanding and agreed to proceed.  History of Present Illness:   Patient is a 63 year old female with history of diabetes type 2 who presents today with chief complaint of leg pain. She reports that her legs seemed to become more painful at night.. Reports no swelling. Both legs are in pain. She reports that the pain is burning and sharp shooting when she is laying down at night. Also has some soreness by the bilateral thighs.  Past Medical History:  Diagnosis Date  . Allergy   . Fatty liver 04/03/2014  . GERD (gastroesophageal reflux disease)   . Hypertension   . 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 Multiple thyroid nodules 08/20/2017  . Obesity   . Type 2 diabetes mellitus, controlled, with renal complications ([place]) 08/12/1094     Social History   Socioeconomic History  . Marital status: Widowed    Spouse name: Not on file  . Number of children: 2  . Years of education: Not on file  . Highest education level: Not on file  Occupational History    Employer: [employer]  Social Needs  . Financial resource strain: Not on file  . Food insecurity    Worry: Not on file    Inability: Not on file  . Transportation needs    Medical: Not on file    Non-medical: Not on file  Tobacco Use  . Smoking status: Never Smoker  . Smokeless tobacco: Never Used  Substance and Sexual Activity  . Alcohol use: Yes    Alcohol/week: 0.0 standard drinks    Comment: occasional wine  . Drug use: No  . Sexual activity: Yes    Partners: Male    Birth control/protection:  Post-menopausal    Comment: 1st intercourse 63 yo-Fewer than 5 partners  Lifestyle  . Physical activity    Days per week: Not on file    Minutes per session: Not on file  . Stress: Not on file  Relationships  . Social Herbalist on phone: Not on file    Gets together: Not on file    Attends religious service: Not on file    Active member of club or organization: Not on file    Attends meetings of clubs or organizations: Not on file    Relationship status: Not on file  . Intimate partner violence    Fear of current or ex partner: Not on file    Emotionally abused: Not on file    Physically abused: Not on file    Forced sexual activity: Not on file  Other Topics Concern  . Not on file  Social History Narrative   Regular exercise:  Walks 2-5 x weekly   Caffeine Use: no   Widowed   She has a son [name]- he is 34 yrs old.   Foster parent.   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   Dog (Lab named Abby)          Past Surgical History:  Procedure Laterality Date  . BIOPSY  THYROID    . BREAST BIOPSY  04/24/11   rigth breast  . BREAST EXCISIONAL BIOPSY    . BREAST LUMPECTOMY WITH RADIOACTIVE SEED LOCALIZATION Left 05/01/2017   Procedure: LEFT BREAST LUMPECTOMY WITH RADIOACTIVE SEED LOCALIZATION;  Surgeon: Donnie Mesa, MD;  Location: [facility];  Service: General;  Laterality: Left;  . CESAREAN SECTION  [id number]  . COLONOSCOPY    . TUBAL LIGATION    . UPPER GASTROINTESTINAL ENDOSCOPY      Family History  Problem Relation Age of Onset  . Lymphoma Mother   . Dementia Mother   . Cancer Father        lung  . Colon cancer Maternal Uncle   . Esophageal cancer Neg Hx   . Pancreatic cancer Neg Hx   . Rectal cancer Neg Hx   . Stomach cancer Neg Hx   . Thyroid disease Neg Hx     Allergies  Allergen Reactions  . Naproxen Nausea And Vomiting    REACTION: vomiting  . Ropinirole Nausea Only   . Vicodin [Hydrocodone-Acetaminophen] Nausea And Vomiting  . Propoxyphene N-Acetaminophen Nausea And Vomiting    Current Outpatient Medications on File Prior to Visit  Medication Sig Dispense Refill  . amLODipine (NORVASC) 10 MG tablet Take 1 tablet (10 mg total) by mouth daily. 90 tablet 1  . atorvastatin (LIPITOR) 10 MG tablet Take 1 tablet (10 mg total) by mouth daily. 90 tablet 1  . blood glucose meter kit and supplies KIT Dispense based on patient and insurance preference. Check sugar 1-2 times daily.  (FOR ICD-9 250.00, 250.) 1 each 0  . calcium-vitamin D (OSCAL WITH D) 500-200 MG-UNIT tablet Take 1 tablet by mouth.    Marland Kitchen lisinopril (ZESTRIL) 2.5 MG tablet Take 1 tablet (2.5 mg total) by mouth daily. 90 tablet 1   No current facility-administered medications on file prior to visit.     LMP 02/28/2007     Observations/Objective:   Gen: Awake, alert, no acute distress Resp: Breathing is even and non-labored Psych: calm/pleasant demeanor Neuro: Alert and Oriented x 3, + facial symmetry, speech is clear.   Assessment and Plan:  Diabetic peripheral neuropathy-she has previously tried Requip and most recently primidone 50 mg.  The Requip gave her nausea and the primidone gave her dizziness.  A few days back we gave her a prescription for gabapentin to take at bedtime.  She reports that she is only taken it once so far and has not noted a great deal of improvement.  Although symptoms are worse at night she does have daytime symptoms as well.  She reports that the 300 mg dose did not make her sleepy.  I have advised her to increase frequency of gabapentin to 300 mg by mouth 3 times daily on a day off and to let me know if she has any issues with somnolence.  Follow Up Instructions:    I discussed the assessment and treatment plan with the patient. The patient was provided an opportunity to ask questions and all were answered. The patient agreed with the plan and demonstrated an  understanding of the instructions.   The patient was advised to call back or seek an in-person evaluation if the symptoms worsen or if the condition fails to improve as anticipated.  Nance Pear, NP

## 2019-07-22 ENCOUNTER — Ambulatory Visit (INDEPENDENT_AMBULATORY_CARE_PROVIDER_SITE_OTHER)

## 2019-07-22 ENCOUNTER — Other Ambulatory Visit: Payer: Self-pay

## 2019-07-22 ENCOUNTER — Ambulatory Visit (INDEPENDENT_AMBULATORY_CARE_PROVIDER_SITE_OTHER): Admitting: Sports Medicine

## 2019-07-22 ENCOUNTER — Encounter: Payer: Self-pay | Admitting: Sports Medicine

## 2019-07-22 DIAGNOSIS — M722 Plantar fascial fibromatosis: Secondary | ICD-10-CM | POA: Diagnosis not present

## 2019-07-22 DIAGNOSIS — M79672 Pain in left foot: Secondary | ICD-10-CM

## 2019-07-22 MED ORDER — PREDNISONE 10 MG (21) PO TBPK: ORAL_TABLET | ORAL | 0 refills | Status: DC

## 2019-07-22 MED ORDER — TRIAMCINOLONE ACETONIDE 10 MG/ML IJ SUSP
10.0000 mg | Freq: Once | INTRAMUSCULAR | Status: AC
  Administered 2019-07-22: 22:00:00 10 mg

## 2019-07-22 NOTE — Patient Instructions (Signed)

## 2019-07-22 NOTE — Progress Notes (Signed)
Subjective: Nancy Deleon is a 63 y.o. female patient presents to office with complaint of moderate heel pain on the left/right. Patient admits to post static dyskinesia for 2 weeks in duration. Patient has treated this problem with icing with no relief. Denies any other pedal complaints.   Reports mom passed away on 01-Feb-2019.  Review of Systems  All other systems reviewed and are negative.   Patient Active Problem List   Diagnosis Date Noted  . Type 2 diabetes mellitus, controlled, with renal complications ([place]) 71/69/6789  . Anxiety 09/12/2017  . Multiple thyroid nodules 08/20/2017  . 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 Allergic rhinitis 11/24/2015  . Back pain of thoracolumbar region 08/10/2014  . Renal cyst, left 08/10/2014  . Musculoskeletal pain 05/14/2014  . Fatty liver 04/03/2014  . Abdominal pain, epigastric 04/01/2014  . Second degree AV block, Mobitz type I 03/23/2014  . GERD (gastroesophageal reflux disease) 02/09/2014  . Nonspecific abnormal electrocardiogram (ECG) (EKG) 02/09/2014  . Pain in joint, ankle and foot 11/17/2013  . Headache(784.0) 08/15/2013  . Routine general medical examination at a health care facility 02/21/2012  . Skin rash 12/18/2011  . Seasonal allergies 11/21/2011  . ANEMIA, IRON DEFICIENCY 12/30/2008  . Hyperglycemia 10/31/2007  . Essential hypertension 10/31/2007  . Impaired fasting glucose 10/31/2007    Current Outpatient Medications on File Prior to Visit  Medication Sig Dispense Refill  . amLODipine (NORVASC) 10 MG tablet Take 1 tablet (10 mg total) by mouth daily. 90 tablet 1  . atorvastatin (LIPITOR) 10 MG tablet Take 1 tablet (10 mg total) by mouth daily. 90 tablet 1  . blood glucose meter kit and supplies KIT Dispense based on patient and insurance preference. Check sugar 1-2 times daily.  (FOR ICD-9 250.00, 250.) 1 each 0  . calcium-vitamin D (OSCAL WITH D) 500-200 MG-UNIT tablet Take 1 tablet by mouth.     . gabapentin (NEURONTIN) 300 MG capsule Take 1 capsule (300 mg total) by mouth 3 (three) times daily. 90 capsule 3  . lisinopril (ZESTRIL) 2.5 MG tablet Take 1 tablet (2.5 mg total) by mouth daily. 90 tablet 1   No current facility-administered medications on file prior to visit.    Allergies  Allergen Reactions  . Naproxen Nausea And Vomiting    REACTION: vomiting  . Ropinirole Nausea Only  . Vicodin [Hydrocodone-Acetaminophen] Nausea And Vomiting  . Propoxyphene N-Acetaminophen Nausea And Vomiting    Objective: Physical Exam General: The patient is alert and oriented x3 in no acute distress.  Dermatology: Skin is warm, dry and supple bilateral lower extremities. Nails 1-10 are normal. There is no erythema, edema, no eccymosis, no open lesions present. Integument is otherwise unremarkable.  Vascular: Dorsalis Pedis pulse and Posterior Tibial pulse are 2/4 bilateral. Capillary fill time is immediate to all digits.  Neurological: Grossly intact to light touch with an achilles reflex of +2/5 and a negative Tinel's sign bilateral.   Musculoskeletal: Tenderness to palpation at the medial calcaneal tubercale and through the insertion of the plantar fascia on the left foot. No pain with compression of calcaneus bilateral. No pain with tuning fork to calcaneus bilateral. No pain with calf compression bilateral. There is decreased Ankle joint range of motion bilateral. All other joints range of motion within normal limits bilateral. Strength 5/5 in all groups bilateral.   Gait: Unassisted, Antalgic avoid weight on left heel  Xray, Left foot:  Normal osseous mineralization. Joint spaces preserved. No fracture/dislocation/boney destruction. Calcaneal spur present with  mild thickening of plantar fascia. No other soft tissue abnormalities or radiopaque foreign bodies.   Assessment and Plan: Problem List Items Addressed This Visit    None    Visit Diagnoses    Plantar fasciitis    -   Primary   Relevant Medications   triamcinolone acetonide (KENALOG) 10 MG/ML injection 10 mg (Start on 07/22/2019  3:30 PM)   Other Relevant Orders   DG Foot Complete Left   Pain of left heel       Relevant Medications   triamcinolone acetonide (KENALOG) 10 MG/ML injection 10 mg (Start on 07/22/2019  3:30 PM)     -Complete examination performed.  -Xrays reviewed -Discussed with patient in detail the condition of plantar fasciitis, how this occurs and general treatment options. Explained both conservative and surgical treatments.  -After oral consent and aseptic prep, injected a mixture containing 1 ml of 2% plain lidocaine, 1 ml 0.5% plain marcaine, 0.5 ml of kenalog 10 and 0.5 ml of dexamethasone phosphate into left heel. Post-injection care discussed with patient.  -Rx Prednisonel dose pack -Recommended good supportive shoes and advised use of OTC insert. Explained to patient that if these orthoses work well, we will continue with these. If these do not improve her condition and  pain, we will consider custom molded orthoses. - Explained in detail the use of the fascial brace on left which was dispensed at today's visit. -Explained and dispensed to patient daily stretching exercises. -Recommend patient to ice affected area 1-2x daily. -Patient to return to office in 4 weeks for follow up or sooner if problems or questions arise.  Landis Martins, DPM

## 2019-07-24 ENCOUNTER — Encounter: Payer: Self-pay | Admitting: Family

## 2019-08-26 ENCOUNTER — Ambulatory Visit: Payer: Self-pay | Admitting: Sports Medicine

## 2019-09-02 ENCOUNTER — Ambulatory Visit (INDEPENDENT_AMBULATORY_CARE_PROVIDER_SITE_OTHER): Admitting: Sports Medicine

## 2019-09-02 ENCOUNTER — Encounter: Payer: Self-pay | Admitting: Sports Medicine

## 2019-09-02 ENCOUNTER — Other Ambulatory Visit: Payer: Self-pay

## 2019-09-02 VITALS — Temp 97.0°F

## 2019-09-02 DIAGNOSIS — M79672 Pain in left foot: Secondary | ICD-10-CM

## 2019-09-02 DIAGNOSIS — M722 Plantar fascial fibromatosis: Secondary | ICD-10-CM

## 2019-09-02 NOTE — Progress Notes (Signed)
Subjective: Nancy Deleon is a 64 y.o. female patient returns to office for follow up evaluation of left heel pain. Reports that injection helped and she completed prednisone and currently has no pain on left. No other issues noted.  Patient Active Problem List   Diagnosis Date Noted  . Type 2 diabetes mellitus, controlled, with renal complications ([place]) 17/61/6073  . Anxiety 09/12/2017  . Multiple thyroid nodules 08/20/2017  . 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 Allergic rhinitis 11/24/2015  . Back pain of thoracolumbar region 08/10/2014  . Renal cyst, left 08/10/2014  . Musculoskeletal pain 05/14/2014  . Fatty liver 04/03/2014  . Abdominal pain, epigastric 04/01/2014  . Second degree AV block, Mobitz type I 03/23/2014  . GERD (gastroesophageal reflux disease) 02/09/2014  . Nonspecific abnormal electrocardiogram (ECG) (EKG) 02/09/2014  . Pain in joint, ankle and foot 11/17/2013  . Headache(784.0) 08/15/2013  . Routine general medical examination at a health care facility 02/21/2012  . Skin rash 12/18/2011  . Seasonal allergies 11/21/2011  . ANEMIA, IRON DEFICIENCY 12/30/2008  . Hyperglycemia 10/31/2007  . Essential hypertension 10/31/2007  . Impaired fasting glucose 10/31/2007    Current Outpatient Medications on File Prior to Visit  Medication Sig Dispense Refill  . amLODipine (NORVASC) 10 MG tablet Take 1 tablet (10 mg total) by mouth daily. 90 tablet 1  . atorvastatin (LIPITOR) 10 MG tablet Take 1 tablet (10 mg total) by mouth daily. 90 tablet 1  . blood glucose meter kit and supplies KIT Dispense based on patient and insurance preference. Check sugar 1-2 times daily.  (FOR ICD-9 250.00, 250.) 1 each 0  . calcium-vitamin D (OSCAL WITH D) 500-200 MG-UNIT tablet Take 1 tablet by mouth.    . gabapentin (NEURONTIN) 300 MG capsule Take 1 capsule (300 mg total) by mouth 3 (three) times daily. 90 capsule 3  . lisinopril (ZESTRIL) 2.5 MG tablet Take 1 tablet  (2.5 mg total) by mouth daily. 90 tablet 1   No current facility-administered medications on file prior to visit.    Allergies  Allergen Reactions  . Naproxen Nausea And Vomiting    REACTION: vomiting  . Ropinirole Nausea Only  . Vicodin [Hydrocodone-Acetaminophen] Nausea And Vomiting  . Propoxyphene N-Acetaminophen Nausea And Vomiting    Objective: Physical Exam General: The patient is alert and oriented x3 in no acute distress.  Dermatology: Skin is warm, dry and supple bilateral lower extremities. Nails 1-10 are normal. There is no erythema, edema, no eccymosis, no open lesions present. Integument is otherwise unremarkable.  Vascular: Dorsalis Pedis pulse and Posterior Tibial pulse are 2/4 bilateral. Capillary fill time is immediate to all digits.  Neurological: Grossly intact to light touch with an achilles reflex of +2/5 and a negative Tinel's sign bilateral.   Musculoskeletal: No tenderness to palpation at the medial calcaneal tubercale and through the insertion of the plantar fascia on the left foot. No pain with compression of calcaneus bilateral. No pain with tuning fork to calcaneus bilateral. No pain with calf compression bilateral. There is decreased Ankle joint range of motion bilateral. All other joints range of motion within normal limits bilateral. Strength 5/5 in all groups bilateral. .   Assessment and Plan: Problem List Items Addressed This Visit    None    Visit Diagnoses    Plantar fasciitis    -  Primary   Pain of left heel         -Complete examination performed.  -Recommended good supportive shoes and advised use custom  molded orthoses. -Continue with plantar fascial brace until she can get custom orthoses  -Continue with daily stretching exercises. -Return for orthotics   Landis Martins, DPM

## 2019-09-02 NOTE — Patient Instructions (Signed)
For tennis shoes recommend:  Brooks Beast Ascis New balance Saucony Can be purchased at [employer] or [employer]  Vionic  SAS Can be purchased at [employer] or [employer]   For work shoes recommend: Sketchers Work Timberland boots  Can be purchased at a variety of places or [employer]   For casual shoes recommend: Vionic  Can be purchased at [employer] or [employer]  

## 2019-09-09 ENCOUNTER — Encounter: Payer: Self-pay | Admitting: Family

## 2019-09-12 ENCOUNTER — Other Ambulatory Visit: Payer: Self-pay

## 2019-09-16 ENCOUNTER — Ambulatory Visit (INDEPENDENT_AMBULATORY_CARE_PROVIDER_SITE_OTHER): Admitting: Family

## 2019-09-16 ENCOUNTER — Encounter: Payer: Self-pay | Admitting: Family

## 2019-09-16 ENCOUNTER — Other Ambulatory Visit: Payer: Self-pay

## 2019-09-16 VITALS — BP 122/62 | HR 64 | Temp 96.1°F | Resp 16 | Ht 67.0 in | Wt 231.0 lb

## 2019-09-16 DIAGNOSIS — M5416 Radiculopathy, lumbar region: Secondary | ICD-10-CM | POA: Diagnosis not present

## 2019-09-16 MED ORDER — ATORVASTATIN CALCIUM 10 MG PO TABS: 10.0000 mg | ORAL_TABLET | Freq: Every day | ORAL | 1 refills | Status: DC

## 2019-09-16 MED ORDER — AMLODIPINE BESYLATE 10 MG PO TABS: 10.0000 mg | ORAL_TABLET | Freq: Every day | ORAL | 1 refills | Status: DC

## 2019-09-16 MED ORDER — TRAMADOL HCL 50 MG PO TABS: 50.0000 mg | ORAL_TABLET | Freq: Four times a day (QID) | ORAL | 0 refills | Status: AC | PRN

## 2019-09-16 MED ORDER — LISINOPRIL 2.5 MG PO TABS: 2.5000 mg | ORAL_TABLET | Freq: Every day | ORAL | 1 refills | Status: DC

## 2019-09-16 MED ORDER — IBUPROFEN 600 MG PO TABS: ORAL_TABLET | ORAL | 0 refills | Status: DC

## 2019-09-16 NOTE — Progress Notes (Signed)
Subjective:    Patient ID: Nancy Deleon, female    DOB: 17-May-1956, 64 y.o.   MRN: [id number]  HPI   Patient is a 64 yr old female who presents today with chief complaint of low back pain.  She reports that the pain radiates into both hips and down both legs but is worse on the left. Seeing her chiropractor.  Pain is in the left hip and radiates down the back of her leg.  Reports that when she lays down at night it "kills me".  States she is having trouble sleeping due to the pain.     Review of Systems See HPI  Past Medical History:  Diagnosis Date  . Allergy   . Fatty liver 04/03/2014  . GERD (gastroesophageal reflux disease)   . Hypertension   . 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 Multiple thyroid nodules 08/20/2017  . Obesity   . Type 2 diabetes mellitus, controlled, with renal complications ([place]) 10/09/3297     Social History   Socioeconomic History  . Marital status: Widowed    Spouse name: Not on file  . Number of children: 2  . Years of education: Not on file  . Highest education level: Not on file  Occupational History    Employer: [employer]  Tobacco Use  . Smoking status: Never Smoker  . Smokeless tobacco: Never Used  Substance and Sexual Activity  . Alcohol use: Yes    Alcohol/week: 0.0 standard drinks    Comment: occasional wine  . Drug use: No  . Sexual activity: Yes    Partners: Male    Birth control/protection: Post-menopausal    Comment: 1st intercourse 64 yo-Fewer than 5 partners  Other Topics Concern  . Not on file  Social History Narrative   Regular exercise:  Walks 2-5 x weekly   Caffeine Use: no   Widowed   She has a son [name]- he is 73 yrs old.   Foster parent.   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   Dog (Lab named Abby)         Social Determinants of Health   Financial Resource Strain:   . Difficulty of Paying Living Expenses: Not on file  Food  Insecurity:   . Worried About Charity fundraiser in the Last Year: Not on file  . Ran Out of Food in the Last Year: Not on file  Transportation Needs:   . Lack of Transportation (Medical): Not on file  . Lack of Transportation (Non-Medical): Not on file  Physical Activity:   . Days of Exercise per Week: Not on file  . Minutes of Exercise per Session: Not on file  Stress:   . Feeling of Stress : Not on file  Social Connections:   . Frequency of Communication with Friends and Family: Not on file  . Frequency of Social Gatherings with Friends and Family: Not on file  . Attends Religious Services: Not on file  . Active Member of Clubs or Organizations: Not on file  . Attends Archivist Meetings: Not on file  . Marital Status: Not on file  Intimate Partner Violence:   . Fear of Current or Ex-Partner: Not on file  . Emotionally Abused: Not on file  . Physically Abused: Not on file  . Sexually Abused: Not on file    Past Surgical History:  Procedure Laterality Date  .  BIOPSY THYROID    . BREAST BIOPSY  04/24/11   rigth breast  . BREAST EXCISIONAL BIOPSY    . BREAST LUMPECTOMY WITH RADIOACTIVE SEED LOCALIZATION Left 05/01/2017   Procedure: LEFT BREAST LUMPECTOMY WITH RADIOACTIVE SEED LOCALIZATION;  Surgeon: Donnie Mesa, MD;  Location: [facility];  Service: General;  Laterality: Left;  . CESAREAN SECTION  [id number]  . COLONOSCOPY    . TUBAL LIGATION    . UPPER GASTROINTESTINAL ENDOSCOPY      Family History  Problem Relation Age of Onset  . Lymphoma Mother   . Dementia Mother   . Cancer Father        lung  . Colon cancer Maternal Uncle   . Esophageal cancer Neg Hx   . Pancreatic cancer Neg Hx   . Rectal cancer Neg Hx   . Stomach cancer Neg Hx   . Thyroid disease Neg Hx     Allergies  Allergen Reactions  . Naproxen Nausea And Vomiting    REACTION: vomiting  . Ropinirole Nausea Only  . Vicodin [Hydrocodone-Acetaminophen] Nausea And Vomiting  .  Propoxyphene N-Acetaminophen Nausea And Vomiting    Current Outpatient Medications on File Prior to Visit  Medication Sig Dispense Refill  . blood glucose meter kit and supplies KIT Dispense based on patient and insurance preference. Check sugar 1-2 times daily.  (FOR ICD-9 250.00, 250.) 1 each 0  . calcium-vitamin D (OSCAL WITH D) 500-200 MG-UNIT tablet Take 1 tablet by mouth.    . gabapentin (NEURONTIN) 300 MG capsule Take 1 capsule (300 mg total) by mouth 3 (three) times daily. 90 capsule 3   No current facility-administered medications on file prior to visit.    BP 122/62 (BP Location: Right Arm, Patient Position: Sitting, Cuff Size: Large)   Pulse 64   Temp (!) 96.1 F (35.6 C) (Temporal)   Resp 16   Ht 5' 7"  (1.702 m)   Wt 231 lb (104.8 kg)   LMP 02/28/2007   SpO2 100%   BMI 36.18 kg/m       Objective:   Physical Exam Constitutional:      Appearance: She is well-developed.  Neck:     Thyroid: No thyromegaly.  Cardiovascular:     Rate and Rhythm: Normal rate and regular rhythm.     Heart sounds: Murmur present.  Pulmonary:     Effort: Pulmonary effort is normal. No respiratory distress.     Breath sounds: Normal breath sounds. No wheezing.  Musculoskeletal:     Cervical back: Neck supple.  Skin:    General: Skin is warm and dry.  Neurological:     Mental Status: She is alert and oriented to person, place, and time.     Deep Tendon Reflexes:     Reflex Scores:      Patellar reflexes are 2+ on the right side and 2+ on the left side.    Comments: Bilateral LE strength is 5/5  Psychiatric:        Behavior: Behavior normal.        Thought Content: Thought content normal.        Judgment: Judgment normal.           Assessment & Plan:  Lumbar radiculopathy- described pain pattern is L5 bilaterally.  Pt is advised as follows:  Begin ibuprofen twice daily for pain for the next week. You may use tramadol as needed for pain (do not drive after taking) Call if  symptoms worsen or if symptoms  fail to improve.   25 minutes spent on today's visit.   This visit occurred during the SARS-CoV-2 public health emergency.  Safety protocols were in place, including screening questions prior to the visit, additional usage of staff PPE, and extensive cleaning of exam room while observing appropriate contact time as indicated for disinfecting solutions.

## 2019-09-16 NOTE — Patient Instructions (Signed)
Begin ibuprofen twice daily for pain for the next week. You may use tramadol as needed for pain (do not drive after taking) Call if symptoms worsen or if symptoms fail to improve.

## 2019-09-29 LAB — HM DIABETES EYE EXAM

## 2019-09-30 ENCOUNTER — Encounter: Payer: Self-pay | Admitting: Family

## 2019-10-01 ENCOUNTER — Encounter: Payer: Self-pay | Admitting: Family

## 2019-10-16 ENCOUNTER — Other Ambulatory Visit: Payer: Self-pay

## 2019-10-17 ENCOUNTER — Encounter: Payer: Self-pay | Admitting: Family

## 2019-10-17 ENCOUNTER — Ambulatory Visit (INDEPENDENT_AMBULATORY_CARE_PROVIDER_SITE_OTHER): Admitting: Family

## 2019-10-17 ENCOUNTER — Other Ambulatory Visit: Payer: Self-pay

## 2019-10-17 VITALS — BP 121/72 | HR 65 | Temp 96.6°F | Resp 16 | Ht 67.0 in | Wt 232.0 lb

## 2019-10-17 DIAGNOSIS — M5416 Radiculopathy, lumbar region: Secondary | ICD-10-CM | POA: Diagnosis not present

## 2019-10-17 MED ORDER — TRAMADOL HCL 50 MG PO TABS: 50.0000 mg | ORAL_TABLET | Freq: Three times a day (TID) | ORAL | 0 refills | Status: AC | PRN

## 2019-10-17 NOTE — Progress Notes (Signed)
Subjective:    Patient ID: Nancy Deleon, female    DOB: 1955/10/31, 64 y.o.   MRN: [id number]  HPI   Patient is a 64 yr old female who presents today with chief complaint of bilateral leg pain.   Reports + pain in her lower back- comes and goes.  Pain in her legs is constant, dow the buttocks, down the back of her leg and into the calf. Has tried NSAIDS, elevation, tramadol without improvement in her symptoms.  Reports left leg hurts more than the right. Denies bowel/bladder incontinence or LE weakness.   Review of Systems See HPI  Past Medical History:  Diagnosis Date  . Allergy   . Fatty liver 04/03/2014  . GERD (gastroesophageal reflux disease)   . Hypertension   . 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 Multiple thyroid nodules 08/20/2017  . Obesity   . Type 2 diabetes mellitus, controlled, with renal complications ([place]) 04/12/1883     Social History   Socioeconomic History  . Marital status: Widowed    Spouse name: Not on file  . Number of children: 2  . Years of education: Not on file  . Highest education level: Not on file  Occupational History    Employer: [employer]  Tobacco Use  . Smoking status: Never Smoker  . Smokeless tobacco: Never Used  Substance and Sexual Activity  . Alcohol use: Yes    Alcohol/week: 0.0 standard drinks    Comment: occasional wine  . Drug use: No  . Sexual activity: Yes    Partners: Male    Birth control/protection: Post-menopausal    Comment: 1st intercourse 64 yo-Fewer than 5 partners  Other Topics Concern  . Not on file  Social History Narrative   Regular exercise:  Walks 2-5 x weekly   Caffeine Use: no   Widowed   She has a son [name]- he is 48 yrs old.   Foster parent.   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   Dog (Lab named Abby)         Social Determinants of Health   Financial Resource Strain:   . Difficulty of Paying Living Expenses:    Food Insecurity:   . Worried About Charity fundraiser in the Last Year:   . Arboriculturist in the Last Year:   Transportation Needs:   . Film/video editor (Medical):   Marland Kitchen Lack of Transportation (Non-Medical):   Physical Activity:   . Days of Exercise per Week:   . Minutes of Exercise per Session:   Stress:   . Feeling of Stress :   Social Connections:   . Frequency of Communication with Friends and Family:   . Frequency of Social Gatherings with Friends and Family:   . Attends Religious Services:   . Active Member of Clubs or Organizations:   . Attends Archivist Meetings:   Marland Kitchen Marital Status:   Intimate Partner Violence:   . Fear of Current or Ex-Partner:   . Emotionally Abused:   Marland Kitchen Physically Abused:   . Sexually Abused:     Past Surgical History:  Procedure Laterality Date  . BIOPSY THYROID    . BREAST BIOPSY  04/24/11   rigth breast  . BREAST EXCISIONAL BIOPSY    . BREAST LUMPECTOMY WITH RADIOACTIVE SEED LOCALIZATION Left 05/01/2017   Procedure: LEFT BREAST LUMPECTOMY WITH RADIOACTIVE SEED LOCALIZATION;  Surgeon: Donnie Mesa, MD;  Location: [facility];  Service: General;  Laterality: Left;  . CESAREAN SECTION  [id number]  . COLONOSCOPY    . TUBAL LIGATION    . UPPER GASTROINTESTINAL ENDOSCOPY      Family History  Problem Relation Age of Onset  . Lymphoma Mother   . Dementia Mother   . Cancer Father        lung  . Colon cancer Maternal Uncle   . Esophageal cancer Neg Hx   . Pancreatic cancer Neg Hx   . Rectal cancer Neg Hx   . Stomach cancer Neg Hx   . Thyroid disease Neg Hx     Allergies  Allergen Reactions  . Naproxen Nausea And Vomiting    REACTION: vomiting  . Ropinirole Nausea Only  . Vicodin [Hydrocodone-Acetaminophen] Nausea And Vomiting  . Propoxyphene N-Acetaminophen Nausea And Vomiting    Current Outpatient Medications on File Prior to Visit  Medication Sig Dispense Refill  . amLODipine (NORVASC) 10 MG tablet  Take 1 tablet (10 mg total) by mouth daily. 90 tablet 1  . atorvastatin (LIPITOR) 10 MG tablet Take 1 tablet (10 mg total) by mouth daily. 90 tablet 1  . blood glucose meter kit and supplies KIT Dispense based on patient and insurance preference. Check sugar 1-2 times daily.  (FOR ICD-9 250.00, 250.) 1 each 0  . calcium-vitamin D (OSCAL WITH D) 500-200 MG-UNIT tablet Take 1 tablet by mouth.    . gabapentin (NEURONTIN) 300 MG capsule Take 1 capsule (300 mg total) by mouth 3 (three) times daily. 90 capsule 3  . ibuprofen (ADVIL) 600 MG tablet Take 1 tablet by mouth twice daily as needed for pain 15 tablet 0  . lisinopril (ZESTRIL) 2.5 MG tablet Take 1 tablet (2.5 mg total) by mouth daily. 90 tablet 1   No current facility-administered medications on file prior to visit.    BP 121/72 (BP Location: Right Arm, Patient Position: Sitting, Cuff Size: Large)   Pulse 65   Temp (!) 96.6 F (35.9 C) (Temporal)   Resp 16   Ht 5' 7" (1.702 m)   Wt 232 lb (105.2 kg)   LMP 02/28/2007   SpO2 97%   BMI 36.34 kg/m       Objective:   Physical Exam Constitutional:      Appearance: She is well-developed.  Neck:     Thyroid: No thyromegaly.  Cardiovascular:     Rate and Rhythm: Normal rate and regular rhythm.     Heart sounds: Normal heart sounds. No murmur.  Pulmonary:     Effort: Pulmonary effort is normal. No respiratory distress.     Breath sounds: Normal breath sounds. No wheezing.  Musculoskeletal:     Cervical back: Neck supple.     Comments: + tenderness to palpation overlying lower lumbar spine  Skin:    General: Skin is warm and dry.  Neurological:     Mental Status: She is alert and oriented to person, place, and time.     Deep Tendon Reflexes:     Reflex Scores:      Patellar reflexes are 2+ on the right side and 2+ on the left side.    Comments: Bilateral LE strength is 5/5   Psychiatric:        Behavior: Behavior normal.        Thought Content: Thought content normal.         Judgment: Judgment normal.  Assessment & Plan:  Lumbar radiculopathy >6 weeks-Uncontrolled. Has not responded to conservative treatment. Will obtain MRI of the lumbar spine for further evaluation.  Continue tramadol prn.   This visit occurred during the SARS-CoV-2 public health emergency.  Safety protocols were in place, including screening questions prior to the visit, additional usage of staff PPE, and extensive cleaning of exam room while observing appropriate contact time as indicated for disinfecting solutions.

## 2019-10-17 NOTE — Patient Instructions (Signed)
You should be contacted about scheduling your MRI of your lower spine.

## 2019-10-18 ENCOUNTER — Ambulatory Visit: Attending: Internal Medicine

## 2019-10-18 DIAGNOSIS — Z23 Encounter for immunization: Secondary | ICD-10-CM

## 2019-10-18 NOTE — Progress Notes (Signed)
   Covid-19 Vaccination Clinic  Name:  Nancy Deleon    MRN: [id number] DOB: 1955/10/15  10/18/2019  [name] was observed post Covid-19 immunization for 15 minutes without incident. She was provided with Vaccine Information Sheet and instruction to access the V-Safe system.   [name] was instructed to call 911 with any severe reactions post vaccine: Marland Kitchen Difficulty breathing  . Swelling of face and throat  . A fast heartbeat  . A bad rash all over body  . Dizziness and weakness   Immunizations Administered    Name Date Dose VIS Date Route   Pfizer COVID-19 Vaccine 10/18/2019  2:43 PM 0.3 mL 07/18/2019 Intramuscular   Manufacturer: [place]   Lot: [id number]   [place]: [id number]

## 2019-10-25 ENCOUNTER — Other Ambulatory Visit: Payer: Self-pay

## 2019-10-25 ENCOUNTER — Ambulatory Visit (HOSPITAL_BASED_OUTPATIENT_CLINIC_OR_DEPARTMENT_OTHER)
Admission: RE | Admit: 2019-10-25 | Discharge: 2019-10-25 | Disposition: A | Discharge status: Home / Self Care | Source: Ambulatory Visit | Attending: Family | Admitting: Family

## 2019-10-25 DIAGNOSIS — M5416 Radiculopathy, lumbar region: Secondary | ICD-10-CM | POA: Insufficient documentation

## 2019-10-27 ENCOUNTER — Telehealth: Payer: Self-pay | Admitting: Family

## 2019-10-27 DIAGNOSIS — G8929 Other chronic pain: Secondary | ICD-10-CM

## 2019-10-27 DIAGNOSIS — M545 Low back pain, unspecified: Secondary | ICD-10-CM

## 2019-10-27 NOTE — Telephone Encounter (Signed)
Patient advised of results and provider's comments. Referral sign.

## 2019-10-27 NOTE — Telephone Encounter (Signed)
Please advise pt that I reviewed her MRI. It notes some degenerative changes as well as 2 bulging discs in her lumbar spine. I would like to refer her to neurosurgery for further evaluation. Referral is pended.

## 2019-11-06 ENCOUNTER — Other Ambulatory Visit: Payer: Self-pay

## 2019-11-10 ENCOUNTER — Ambulatory Visit (INDEPENDENT_AMBULATORY_CARE_PROVIDER_SITE_OTHER): Admitting: Obstetrics and Gynecology

## 2019-11-10 ENCOUNTER — Encounter: Payer: Self-pay | Admitting: Obstetrics and Gynecology

## 2019-11-10 ENCOUNTER — Telehealth: Payer: Self-pay | Admitting: Family

## 2019-11-10 ENCOUNTER — Other Ambulatory Visit: Payer: Self-pay

## 2019-11-10 VITALS — BP 124/80 | Ht 67.0 in | Wt 231.0 lb

## 2019-11-10 DIAGNOSIS — Z01419 Encounter for gynecological examination (general) (routine) without abnormal findings: Secondary | ICD-10-CM

## 2019-11-10 NOTE — Telephone Encounter (Signed)
Patient referred to A neuro-surgeon and states that they are going to give her a shot for pain but pt will have to wait until 11/28/2019, is there anyway that Nancy Deleon can speed this process up.

## 2019-11-10 NOTE — Progress Notes (Signed)
Nancy Deleon 08/04/1956 [id number]  SUBJECTIVE:  64 y.o. G1P1 female for annual routine gynecologic exam. She has no gynecologic concerns.  Current Outpatient Medications  Medication Sig Dispense Refill  . amLODipine (NORVASC) 10 MG tablet Take 1 tablet (10 mg total) by mouth daily. 90 tablet 1  . atorvastatin (LIPITOR) 10 MG tablet Take 1 tablet (10 mg total) by mouth daily. 90 tablet 1  . blood glucose meter kit and supplies KIT Dispense based on patient and insurance preference. Check sugar 1-2 times daily.  (FOR ICD-9 250.00, 250.) 1 each 0  . calcium-vitamin D (OSCAL WITH D) 500-200 MG-UNIT tablet Take 1 tablet by mouth.    . ibuprofen (ADVIL) 600 MG tablet Take 1 tablet by mouth twice daily as needed for pain 15 tablet 0  . lisinopril (ZESTRIL) 2.5 MG tablet Take 1 tablet (2.5 mg total) by mouth daily. 90 tablet 1   No current facility-administered medications for this visit.   Allergies: Naproxen, Ropinirole, Vicodin [hydrocodone-acetaminophen], and Propoxyphene n-acetaminophen  Patient's last menstrual period was 02/28/2007.  Past medical history,surgical history, problem list, medications, allergies, family history and social history were all reviewed and documented as reviewed in the EPIC chart.  ROS:  Feeling well. No dyspnea or chest pain on exertion.  No abdominal pain, change in bowel habits, black or bloody stools.  No urinary tract symptoms. GYN ROS: normal menses, no abnormal bleeding, pelvic pain or discharge, no breast pain or new or enlarging lumps on self exam. No neurological complaints.    OBJECTIVE:  BP 124/80   Ht 5' 7" (1.702 m)   Wt 231 lb (104.8 kg)   LMP 02/28/2007   BMI 36.18 kg/m  The patient appears well, alert, oriented x 3, in no distress. ENT normal.  Neck supple. No cervical or supraclavicular adenopathy or thyromegaly.  Lungs are clear, good air entry, no wheezes, rhonchi or rales. S1 and S2 normal, no murmurs, regular rate and  rhythm.  Abdomen soft without tenderness, guarding, mass or organomegaly.  Neurological is normal, no focal findings.  BREAST EXAM: breasts appear normal, no suspicious masses, no skin or nipple changes or axillary nodes  PELVIC EXAM: VULVA: normal appearing vulva with no masses, tenderness or lesions, atrophic changes, VAGINA: normal appearing vagina with normal color and discharge, no lesions, CERVIX: normal appearing cervix without discharge or lesions, UTERUS: uterus is normal size, shape, consistency and nontender, ADNEXA: normal adnexa in size, nontender and no masses, RECTAL: normal rectal, no masses  Chaperone: Kim Gardner present during the examination  ASSESSMENT:  64 y.o. G1P1 here for annual gynecologic exam  PLAN:   1.  Postmenopausal. No hormonal concerns. No hot flashes or vaginal bleeding. 2. Pap smear/HPV 10/2018.  No signficant history of abnormal Pap smears.  Next Pap smear due 2025 following the current guidelines recommending the 5-year co-testing interval. 3. Mammogram 04/2019.  Normal breast exam today.  She is reminded to schedule an annual mammogram this year when due. 4. Colonoscopy 2011.  Colonoscopy appears due so she plans to get this scheduled. 5. DEXA 09/2018 was normal.  Next DEXA recommended at age 66. 6. Health maintenance.  No labs today as she normally has these completed with her primary care provider, and were performed last fall.  Return annually or sooner, prn.  Jeffrey Kendall MD, FACOG  11/10/19  

## 2019-11-11 NOTE — Telephone Encounter (Signed)
[facility] and had to leave a detailed message with the pain management department to see if they have a sooner appointment or a cancellation list. I asked them to call us or the patient with this information. Patient has been advised and she will let us know if they contact her. I will let her know if they contact us about the appointment.

## 2019-11-11 NOTE — Telephone Encounter (Signed)
Could you please call [facility] and ask if they have any sooner appointments as the patient is in a great deal of pain. (then update pt please). If no sooner apt then maybe they can place her on a cancellation list?

## 2019-11-11 NOTE — Telephone Encounter (Signed)
Received a call from [place] at [facility], pain management. She called to let us know she offered patient an appointment foe today and patient declined.

## 2019-11-12 ENCOUNTER — Ambulatory Visit: Attending: Internal Medicine

## 2019-11-12 DIAGNOSIS — Z23 Encounter for immunization: Secondary | ICD-10-CM

## 2019-11-12 NOTE — Progress Notes (Signed)
   Covid-19 Vaccination Clinic  Name:  Nancy Deleon    MRN: [id number] DOB: 1956/04/26  11/12/2019  Nancy Deleon was observed post Covid-19 immunization for 15 minutes without incident. She was provided with Vaccine Information Sheet and instruction to access the V-Safe system.   Nancy Deleon was instructed to call 911 with any severe reactions post vaccine: Marland Kitchen Difficulty breathing  . Swelling of face and throat  . A fast heartbeat  . A bad rash all over body  . Dizziness and weakness   Immunizations Administered    Name Date Dose VIS Date Route   Pfizer COVID-19 Vaccine 11/12/2019 11:20 AM 0.3 mL 07/18/2019 Intramuscular   Manufacturer: [employer]   Lot: [id number]   [place]: [id number]

## 2019-12-18 ENCOUNTER — Ambulatory Visit (AMBULATORY_SURGERY_CENTER): Payer: Self-pay

## 2019-12-18 ENCOUNTER — Other Ambulatory Visit: Payer: Self-pay

## 2019-12-18 VITALS — Temp 97.1°F | Ht 67.0 in | Wt 229.0 lb

## 2019-12-18 DIAGNOSIS — Z1211 Encounter for screening for malignant neoplasm of colon: Secondary | ICD-10-CM

## 2019-12-18 MED ORDER — NA SULFATE-K SULFATE-MG SULF 17.5-3.13-1.6 GM/177ML PO SOLN: 1.0000 | Freq: Once | ORAL | 0 refills | Status: AC

## 2019-12-18 NOTE — Progress Notes (Signed)
No egg or soy allergy known to patient  No issues with past sedation with any surgeries  or procedures, no intubation problems  No diet pills per patient No home 02 use per patient  No blood thinners per patient  Pt denies issues with constipation  No A fib or A flutter  EMMI video sent to pt's e mail   Suprep code and coupon given.  COMPLETED COVID VACCINE SERIES 11/12/19.  Due to the COVID-19 pandemic we are asking patients to follow these guidelines. Please only bring one care partner. Please be aware that your care partner may wait in the car in the parking lot or if they feel like they will be too hot to wait in the car, they may wait in the lobby on the 4th floor. All care partners are required to wear a mask the entire time (we do not have any that we can provide them), they need to practice social distancing, and we will do a Covid check for all patient's and care partners when you arrive. Also we will check their temperature and your temperature. If the care partner waits in their car they need to stay in the parking lot the entire time and we will call them on their cell phone when the patient is ready for discharge so they can bring the car to the front of the building. Also all patient's will need to wear a mask into building.

## 2020-01-01 ENCOUNTER — Ambulatory Visit (AMBULATORY_SURGERY_CENTER): Admitting: Gastroenterology

## 2020-01-01 ENCOUNTER — Encounter: Payer: Self-pay | Admitting: Gastroenterology

## 2020-01-01 ENCOUNTER — Other Ambulatory Visit: Payer: Self-pay

## 2020-01-01 VITALS — BP 129/73 | HR 56 | Temp 97.1°F | Resp 18 | Ht 67.0 in | Wt 229.0 lb

## 2020-01-01 DIAGNOSIS — Z1211 Encounter for screening for malignant neoplasm of colon: Secondary | ICD-10-CM | POA: Diagnosis present

## 2020-01-01 MED ORDER — SODIUM CHLORIDE 0.9 % IV SOLN: 500.0000 mL | Freq: Once | INTRAVENOUS | Status: DC

## 2020-01-01 NOTE — Patient Instructions (Signed)
YOU HAD AN ENDOSCOPIC PROCEDURE TODAY AT THE [facility]:   Refer to the procedure report that was given to you for any specific questions about what was found during the examination.  If the procedure report does not answer your questions, please call your gastroenterologist to clarify.  If you requested that your care partner not be given the details of your procedure findings, then the procedure report has been included in a sealed envelope for you to review at your convenience later.  YOU SHOULD EXPECT: Some feelings of bloating in the abdomen. Passage of more gas than usual.  Walking can help get rid of the air that was put into your GI tract during the procedure and reduce the bloating. If you had a lower endoscopy (such as a colonoscopy or flexible sigmoidoscopy) you may notice spotting of blood in your stool or on the toilet paper. If you underwent a bowel prep for your procedure, you may not have a normal bowel movement for a few days.  Please Note:  You might notice some irritation and congestion in your nose or some drainage.  This is from the oxygen used during your procedure.  There is no need for concern and it should clear up in a day or so.  SYMPTOMS TO REPORT IMMEDIATELY:   Following lower endoscopy (colonoscopy or flexible sigmoidoscopy):  Excessive amounts of blood in the stool  Significant tenderness or worsening of abdominal pains  Swelling of the abdomen that is new, acute  Fever of 100F or higher  For urgent or emergent issues, a gastroenterologist can be reached at any hour by calling [phone]. Do not use MyChart messaging for urgent concerns.    DIET:  We do recommend a small meal at first, but then you may proceed to your regular diet.  Drink plenty of fluids but you should avoid alcoholic beverages for 24 hours.  ACTIVITY:  You should plan to take it easy for the rest of today and you should NOT DRIVE or use heavy machinery until tomorrow (because  of the sedation medicines used during the test).    FOLLOW UP: Our staff will call the number listed on your records 48-72 hours following your procedure to check on you and address any questions or concerns that you may have regarding the information given to you following your procedure. If we do not reach you, we will leave a message.  We will attempt to reach you two times.  During this call, we will ask if you have developed any symptoms of COVID 19. If you develop any symptoms (ie: fever, flu-like symptoms, shortness of breath, cough etc.) before then, please call [phone].  If you test positive for Covid 19 in the 2 weeks post procedure, please call and report this information to us.    If any biopsies were taken you will be contacted by phone or by letter within the next 1-3 weeks.  Please call us at [phone] if you have not heard about the biopsies in 3 weeks.    SIGNATURES/CONFIDENTIALITY: You and/or your care partner have signed paperwork which will be entered into your electronic medical record.  These signatures attest to the fact that that the information above on your After Visit Summary has been reviewed and is understood.  Full responsibility of the confidentiality of this discharge information lies with you and/or your care-partner. 

## 2020-01-01 NOTE — Op Note (Signed)
[facility] Patient Name: Brett Kitch Procedure Date: 01/01/2020 10:27 AM MRN: [id number] Endoscopist: Ladene Artist , MD Age: 64 Referring MD:  Date of Birth: Jan 09, 1956 Gender: Female Account #: [id number] Procedure:                Colonoscopy Indications:              Screening for colorectal malignant neoplasm Medicines:                Monitored Anesthesia Care Procedure:                Pre-Anesthesia Assessment:                           - Prior to the procedure, a History and Physical                            was performed, and patient medications and                            allergies were reviewed. The patient's tolerance of                            previous anesthesia was also reviewed. The risks                            and benefits of the procedure and the sedation                            options and risks were discussed with the patient.                            All questions were answered, and informed consent                            was obtained. Prior Anticoagulants: The patient has                            taken no previous anticoagulant or antiplatelet                            agents. ASA Grade Assessment: III - A patient with                            severe systemic disease. After reviewing the risks                            and benefits, the patient was deemed in                            satisfactory condition to undergo the procedure.                           After obtaining informed consent, the colonoscope  was passed under direct vision. Throughout the                            procedure, the patient's blood pressure, pulse, and                            oxygen saturations were monitored continuously. The                            Colonoscope was introduced through the anus and                            advanced to the the cecum, identified by                            appendiceal orifice  and ileocecal valve. The                            ileocecal valve, appendiceal orifice, and rectum                            were photographed. The quality of the bowel                            preparation was adequate after extensive lavage and                            suction. The colonoscopy was performed without                            difficulty. The patient tolerated the procedure                            well. Scope In: 10:37:53 AM Scope Out: 10:57:33 AM Scope Withdrawal Time: 0 hours 13 minutes 18 seconds  Total Procedure Duration: 0 hours 19 minutes 40 seconds  Findings:                 The perianal and digital rectal examinations were                            normal.                           Scattered medium-mouthed diverticula were found in                            the entire colon.                           The exam was otherwise without abnormality on                            direct and retroflexion views. Complications:            No immediate complications. Estimated blood loss:  None. Estimated Blood Loss:     Estimated blood loss: none. Impression:               - Diverticulosis in the entire examined colon.                           - The examination was otherwise normal on direct                            and retroflexion views.                           - No specimens collected. Recommendation:           - Repeat colonoscopy in 10 years for screening                            purposes with a more extensive bowel prep.                           - Patient has a contact number available for                            emergencies. The signs and symptoms of potential                            delayed complications were discussed with the                            patient. Return to normal activities tomorrow.                            Written discharge instructions were provided to the                             patient.                           - Resume previous diet.                           - Continue present medications. Ladene Artist, MD 01/01/2020 11:00:46 AM This report has been signed electronically.

## 2020-01-01 NOTE — Progress Notes (Signed)
VS taken by CW  Pt's states no medical or surgical changes since previsit or office visit.

## 2020-01-01 NOTE — Progress Notes (Signed)
pt tolerated well. VSS. awake and to recovery. Report given to RN.  

## 2020-01-06 ENCOUNTER — Telehealth: Payer: Self-pay | Admitting: *Deleted

## 2020-01-06 ENCOUNTER — Telehealth: Payer: Self-pay

## 2020-01-06 NOTE — Telephone Encounter (Signed)
Called [phone] and left a messaged we tried to reach pt for a follow up call. maw

## 2020-01-06 NOTE — Telephone Encounter (Signed)
1. Have you developed a fever since your procedure? no  2.   Have you had an respiratory symptoms (SOB or cough) since your procedure? no  3.   Have you tested positive for COVID 19 since your procedure no  4.   Have you had any family members/close contacts diagnosed with the COVID 19 since your procedure?  no   If yes to any of these questions please route to Joylene John, RN and Erenest Rasher, RN Follow up Call-  Call back number 01/01/2020  Post procedure Call Back phone  # [phone]  Permission to leave phone message Yes  Some recent data might be hidden     Patient questions:  Do you have a fever, pain , or abdominal swelling? No. Pain Score  0 *  Have you tolerated food without any problems? Yes.    Have you been able to return to your normal activities? Yes.    Do you have any questions about your discharge instructions: Diet   No. Medications  No. Follow up visit  No.  Do you have questions or concerns about your Care? No.  Actions: * If pain score is 4 or above: No action needed, pain <4.

## 2020-03-14 ENCOUNTER — Other Ambulatory Visit: Payer: Self-pay | Admitting: Family

## 2020-04-15 ENCOUNTER — Other Ambulatory Visit: Payer: Self-pay | Admitting: Family

## 2020-04-21 ENCOUNTER — Ambulatory Visit (INDEPENDENT_AMBULATORY_CARE_PROVIDER_SITE_OTHER): Admitting: Family

## 2020-04-21 ENCOUNTER — Ambulatory Visit (HOSPITAL_BASED_OUTPATIENT_CLINIC_OR_DEPARTMENT_OTHER)
Admission: RE | Admit: 2020-04-21 | Discharge: 2020-04-21 | Disposition: A | Discharge status: Home / Self Care | Source: Ambulatory Visit | Attending: Family | Admitting: Family

## 2020-04-21 ENCOUNTER — Other Ambulatory Visit: Payer: Self-pay

## 2020-04-21 ENCOUNTER — Telehealth: Payer: Self-pay | Admitting: Family

## 2020-04-21 ENCOUNTER — Encounter: Payer: Self-pay | Admitting: Family

## 2020-04-21 VITALS — BP 141/54 | HR 63 | Temp 98.6°F | Resp 16 | Ht 67.0 in | Wt 227.0 lb

## 2020-04-21 DIAGNOSIS — M542 Cervicalgia: Secondary | ICD-10-CM

## 2020-04-21 DIAGNOSIS — Z23 Encounter for immunization: Secondary | ICD-10-CM

## 2020-04-21 DIAGNOSIS — R809 Proteinuria, unspecified: Secondary | ICD-10-CM | POA: Diagnosis not present

## 2020-04-21 DIAGNOSIS — E1169 Type 2 diabetes mellitus with other specified complication: Secondary | ICD-10-CM | POA: Diagnosis not present

## 2020-04-21 DIAGNOSIS — E1129 Type 2 diabetes mellitus with other diabetic kidney complication: Secondary | ICD-10-CM

## 2020-04-21 DIAGNOSIS — E785 Hyperlipidemia, unspecified: Secondary | ICD-10-CM

## 2020-04-21 MED ORDER — AMLODIPINE BESYLATE 10 MG PO TABS: 10.0000 mg | ORAL_TABLET | Freq: Every day | ORAL | 1 refills | Status: DC

## 2020-04-21 MED ORDER — CELECOXIB 200 MG PO CAPS: 200.0000 mg | ORAL_CAPSULE | Freq: Every day | ORAL | 0 refills | Status: DC | PRN

## 2020-04-21 MED ORDER — ATORVASTATIN CALCIUM 10 MG PO TABS: 10.0000 mg | ORAL_TABLET | Freq: Every day | ORAL | 1 refills | Status: DC

## 2020-04-21 NOTE — Telephone Encounter (Signed)
Please call Dr. Hilma Favors to request a copy of her diabetic eye exam.

## 2020-04-21 NOTE — Telephone Encounter (Signed)
Records release form faxed for this request

## 2020-04-21 NOTE — Progress Notes (Signed)
Subjective:    Patient ID: Nancy Deleon, female    DOB: 1955/08/21, 64 y.o.   MRN: [id number]  HPI  Patient is a 64 yr old female who presents today with chief complaint of neck pain. Reports pain has been present for several months. Pain is constant but worse with movement (when she turns to the right). Saw a chiropractor without improvement in her symptoms.  Has tried icy hot/biofreeze without improvement.   Denies leg weakness or arm numbness.  Pain does not radiate into the shoulders or the arms.   Able to tolerate ibuprofen.      Review of Systems    see HPI  Past Medical History:  Diagnosis Date  . Allergy   . Fatty liver 04/03/2014  . GERD (gastroesophageal reflux disease)   . Hypertension   . 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 Multiple thyroid nodules 08/20/2017  . Obesity   . Type 2 diabetes mellitus, controlled, with renal complications ([facility]) 5/0/2774   12/18/19-pt states she does not have diabetes, just checks blood sugar as needed.     Social History   Socioeconomic History  . Marital status: Widowed    Spouse name: Not on file  . Number of children: 2  . Years of education: Not on file  . Highest education level: Not on file  Occupational History    Employer: [employer]  Tobacco Use  . Smoking status: Never Smoker  . Smokeless tobacco: Never Used  Vaping Use  . Vaping Use: Never used  Substance and Sexual Activity  . Alcohol use: Yes    Alcohol/week: 0.0 standard drinks    Comment: occasional wine  . Drug use: No  . Sexual activity: Yes    Partners: Male    Birth control/protection: Post-menopausal    Comment: 1st intercourse 64 yo-Fewer than 5 partners  Other Topics Concern  . Not on file  Social History Narrative   Regular exercise:  Walks 2-5 x weekly   Caffeine Use: no   Widowed   She has a son [name]- he is 71 yrs old.   Foster parent.   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     [employer]ist   She  has associates degree.   Dog (Lab named Abby)         Social Determinants of Health   Financial Resource Strain:   . Difficulty of Paying Living Expenses: Not on file  Food Insecurity:   . Worried About Charity fundraiser in the Last Year: Not on file  . Ran Out of Food in the Last Year: Not on file  Transportation Needs:   . Lack of Transportation (Medical): Not on file  . Lack of Transportation (Non-Medical): Not on file  Physical Activity:   . Days of Exercise per Week: Not on file  . Minutes of Exercise per Session: Not on file  Stress:   . Feeling of Stress : Not on file  Social Connections:   . Frequency of Communication with Friends and Family: Not on file  . Frequency of Social Gatherings with Friends and Family: Not on file  . Attends Religious Services: Not on file  . Active Member of Clubs or Organizations: Not on file  . Attends Archivist Meetings: Not on file  . Marital Status: Not on file  Intimate Partner Violence:   . Fear of Current or Ex-Partner: Not on file  . Emotionally Abused: Not on file  .  Physically Abused: Not on file  . Sexually Abused: Not on file    Past Surgical History:  Procedure Laterality Date  . BIOPSY THYROID    . BREAST BIOPSY  04/24/11   [facility] breast  . BREAST EXCISIONAL BIOPSY    . BREAST LUMPECTOMY WITH RADIOACTIVE SEED LOCALIZATION Left 05/01/2017   Procedure: LEFT BREAST LUMPECTOMY WITH RADIOACTIVE SEED LOCALIZATION;  Surgeon: Donnie Mesa, MD;  Location: [facility];  Service: General;  Laterality: Left;  . CESAREAN SECTION  [id number]  . COLONOSCOPY  2011   [facility]-  normal  . TUBAL LIGATION    . UPPER GASTROINTESTINAL ENDOSCOPY      Family History  Problem Relation Age of Onset  . Lymphoma Mother   . Dementia Mother   . Cancer Father        lung  . Colon cancer Maternal Uncle   . Esophageal cancer Neg Hx   . Pancreatic cancer Neg Hx   . Rectal cancer Neg Hx   . Stomach cancer Neg Hx     . Thyroid disease Neg Hx   . Colon polyps Neg Hx     Allergies  Allergen Reactions  . Naproxen Nausea And Vomiting    REACTION: vomiting  . Ropinirole Nausea Only  . Vicodin [Hydrocodone-Acetaminophen] Nausea And Vomiting  . Propoxyphene N-Acetaminophen Nausea And Vomiting    Current Outpatient Medications on File Prior to Visit  Medication Sig Dispense Refill  . acetaminophen (TYLENOL) 500 MG tablet Take 1,000 mg by mouth every 6 (six) hours as needed.    Marland Kitchen amLODipine (NORVASC) 10 MG tablet TAKE 1 TABLET DAILY 90 tablet 1  . atorvastatin (LIPITOR) 10 MG tablet Take 1 tablet (10 mg total) by mouth daily. 90 tablet 1  . blood glucose meter kit and supplies KIT Dispense based on patient and insurance preference. Check sugar 1-2 times daily.  (FOR ICD-9 250.00, 250.) 1 each 0  . calcium-vitamin D (OSCAL WITH D) 500-200 MG-UNIT tablet Take 1 tablet by mouth.    Marland Kitchen ibuprofen (ADVIL) 600 MG tablet Take 1 tablet by mouth twice daily as needed for pain 15 tablet 0  . lisinopril (ZESTRIL) 2.5 MG tablet TAKE 1 TABLET DAILY 90 tablet 1  . meloxicam (MOBIC) 15 MG tablet Take 15 mg by mouth daily.    . Multiple Vitamins-Minerals (CENTRUM ULTRA WOMENS) TABS Take 1 tablet by mouth daily.     Current Facility-Administered Medications on File Prior to Visit  Medication Dose Route Frequency Provider Last Rate Last Admin  . 0.9 %  sodium chloride infusion  500 mL Intravenous Once Ladene Artist, MD        BP (!) 141/54 (BP Location: Right Arm, Patient Position: Sitting, Cuff Size: Large)   Pulse 63   Temp 98.6 F (37 C) (Oral)   Resp 16   Ht 5' 7"  (1.702 m)   Wt 227 lb (103 kg)   LMP 02/28/2007   SpO2 100%   BMI 35.55 kg/m    Objective:   Physical Exam Constitutional:      Appearance: She is well-developed.  Neck:     Thyroid: No thyromegaly.  Cardiovascular:     Rate and Rhythm: Normal rate and regular rhythm.     Heart sounds: Normal heart sounds. No murmur heard.   Pulmonary:      Effort: Pulmonary effort is normal. No respiratory distress.     Breath sounds: Normal breath sounds. No wheezing.  Musculoskeletal:  Cervical back: Neck supple.     Comments: No C-spine tenderness to palpation  Skin:    General: Skin is warm and dry.  Neurological:     Mental Status: She is alert and oriented to person, place, and time.     Comments: Bilateral UE strength is 5/5  Psychiatric:        Behavior: Behavior normal.        Thought Content: Thought content normal.        Judgment: Judgment normal.           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 Will give trial of celebrex. Hopefully she will be able to tolerate without GI side effects.  Obtain plain film of cspine for further evaluation.  HTN- bp is acceptable. Continue amlodipine 36m/lisinopril.   DM2- clinically stable on diabetic diet. Continue lisinopril for renal protection.   Hyperlipidemia-  Lab Results  Component Value Date   CHOL 142 07/02/2019   HDL 59.50 07/02/2019   LDLCALC 69 07/02/2019   TRIG 68.0 07/02/2019   CHOLHDL 2 07/02/2019   Flu shot today.

## 2020-04-21 NOTE — Patient Instructions (Signed)
Please complete x-ray on the first floor.  Begin celebrex (anti-inflammatory) once daily as needed.  Call if symptoms worsen or if symptoms are not improved in 1 week.

## 2020-04-22 ENCOUNTER — Telehealth: Payer: Self-pay | Admitting: Family

## 2020-04-22 LAB — BASIC METABOLIC PANEL
BUN: 13 mg/dL (ref 7–25)
CO2: 32 mmol/L (ref 20–32)
Calcium: 9.5 mg/dL (ref 8.6–10.4)
Chloride: 104 mmol/L (ref 98–110)
Creat: 0.95 mg/dL (ref 0.50–0.99)
Glucose, Bld: 135 mg/dL — ABNORMAL HIGH (ref 65–99)
Potassium: 4.4 mmol/L (ref 3.5–5.3)
Sodium: 142 mmol/L (ref 135–146)

## 2020-04-22 LAB — HEMOGLOBIN A1C
Hgb A1c MFr Bld: 6.7 % of total Hgb — ABNORMAL HIGH (ref <5.7)
Mean Plasma Glucose: 146 (calc)
eAG (mmol/L): 8.1 (calc)

## 2020-04-22 LAB — LIPID PANEL
Cholesterol: 151 mg/dL (ref <200)
HDL: 66 mg/dL (ref >=50)
LDL Cholesterol (Calc): 69 mg/dL (calc)
Non-HDL Cholesterol (Calc): 85 mg/dL (calc) (ref <130)
Total CHOL/HDL Ratio: 2.3 (calc) (ref <5.0)
Triglycerides: 77 mg/dL (ref <150)

## 2020-04-22 NOTE — Telephone Encounter (Signed)
Provider sent message to patient on her MyChart

## 2020-04-22 NOTE — Telephone Encounter (Signed)
Patient requesting test results from:   DG Cervical Spine Complete

## 2020-05-05 IMAGING — US US THYROID
1 series · 13 of 25 positions shown · non-contrast
Comparison: Most recent prior thyroid ultrasound 08/16/2017

CLINICAL DATA: Goiter. 62-year-old female with a history of thyroid
goiter. She has a right inferior thyroid nodule which was previously
biopsied on 09/13/2017.

EXAM:
THYROID ULTRASOUND
TECHNIQUE: Ultrasound examination of the thyroid gland and adjacent soft
tissues was performed.

[Series 1: us thyroid · 0.07mm/px · 13 of 39 slices shown]
[im 1/39]
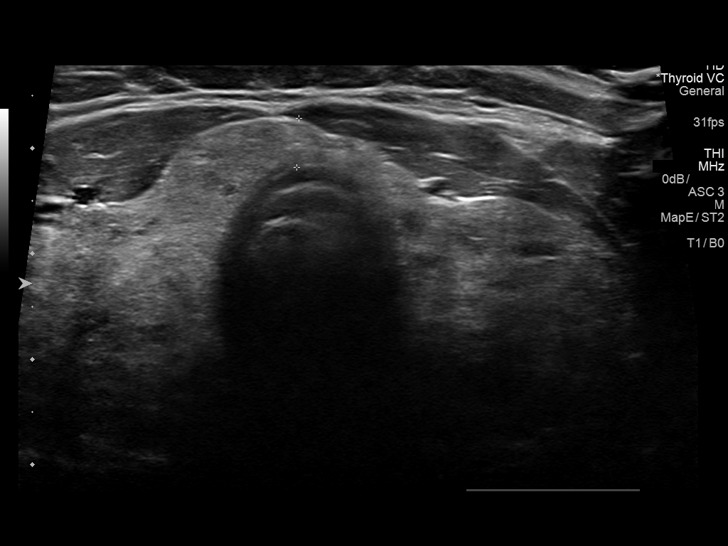
[im 4/39]
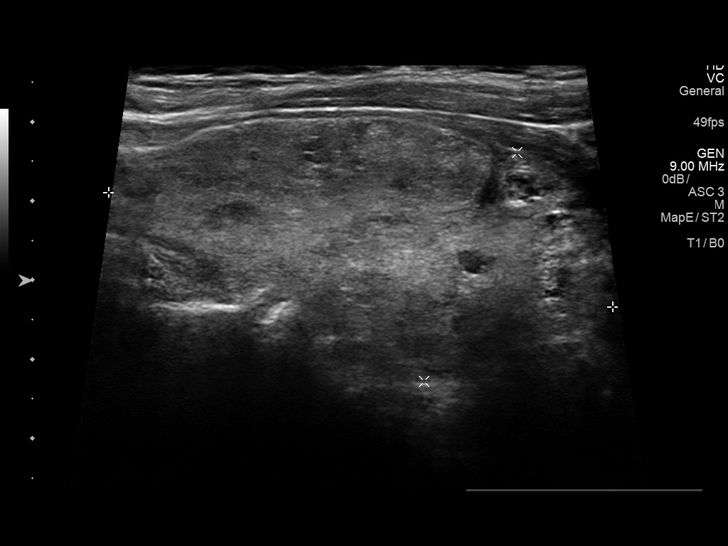
[im 7/39]
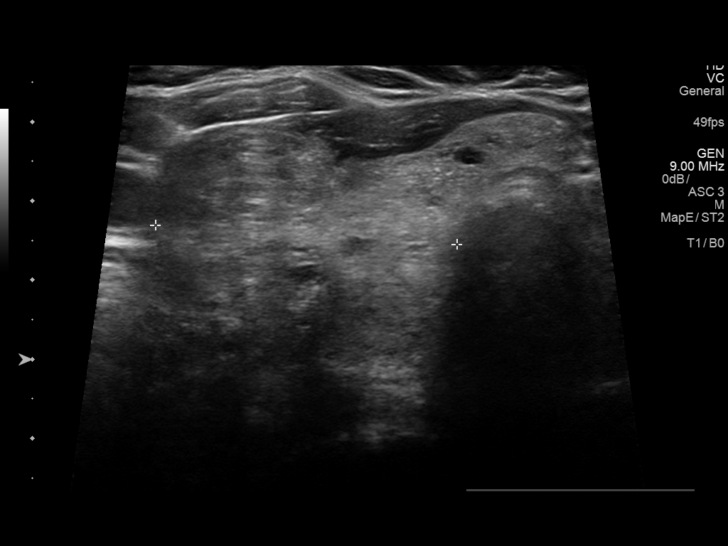
[im 10/39]
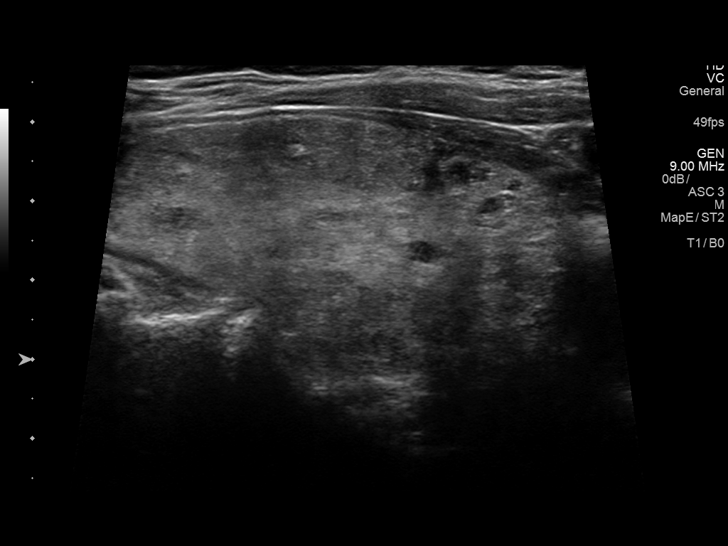
[im 13/39]
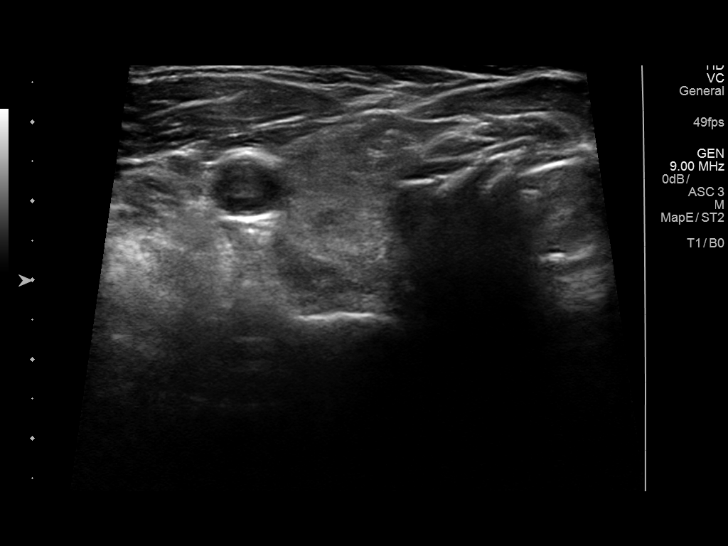
[im 16/39]
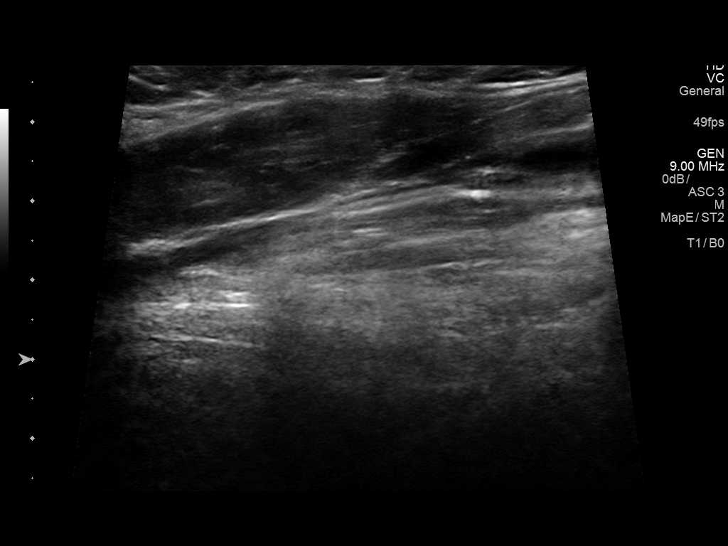
[im 20/39]
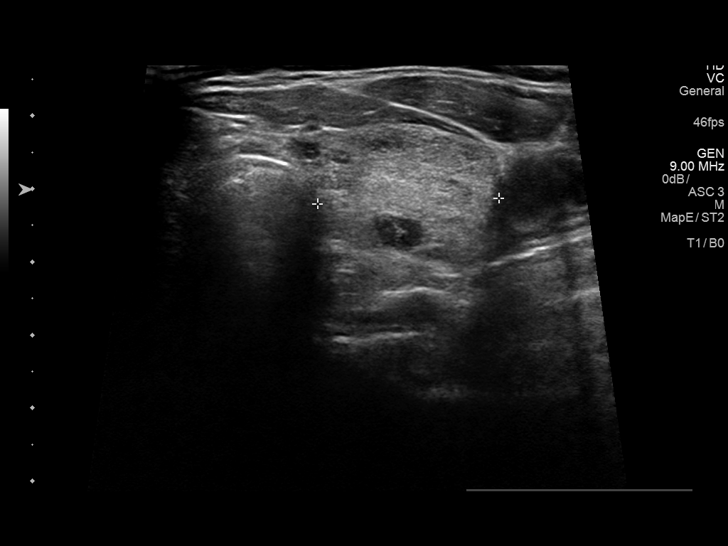
[im 23/39]
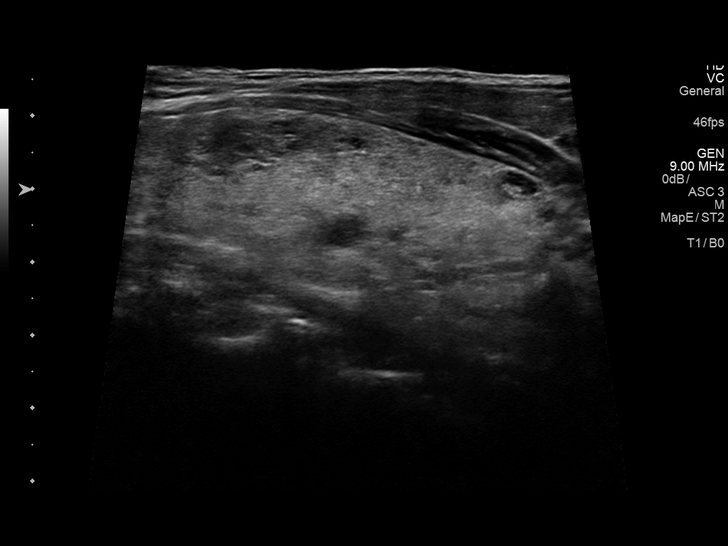
[im 26/39]
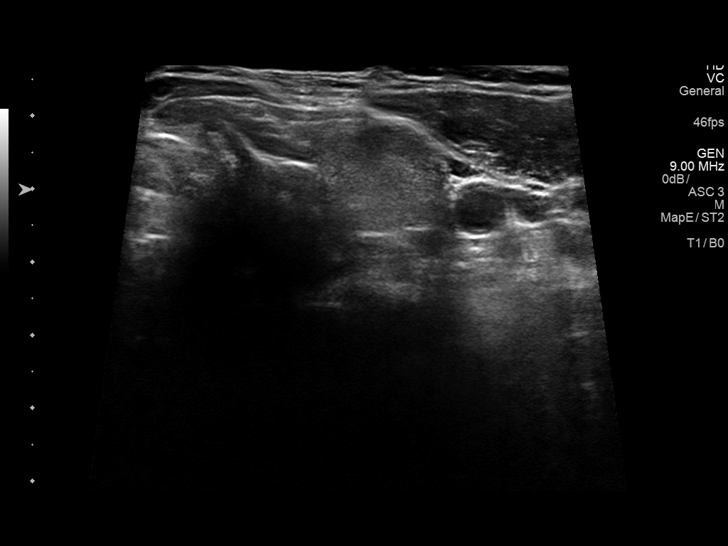
[im 29/39]
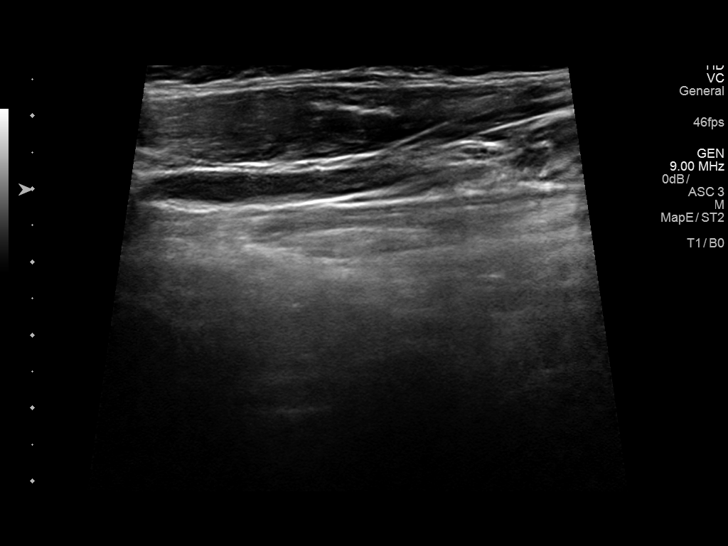
[im 32/39]
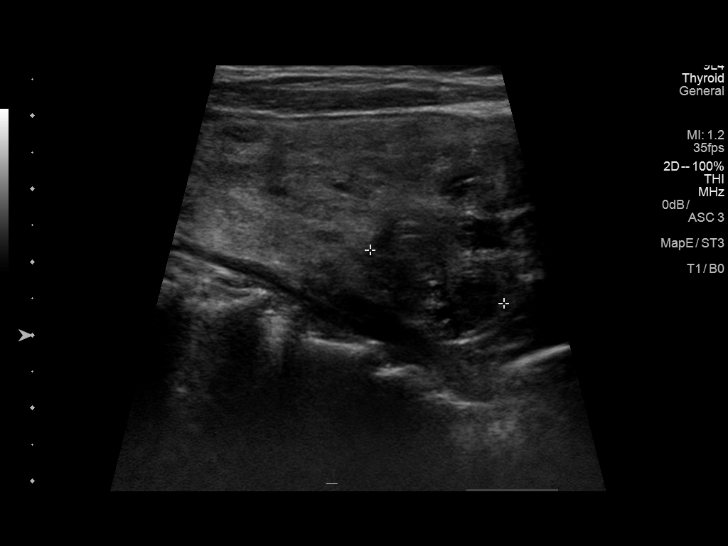
[im 35/39]
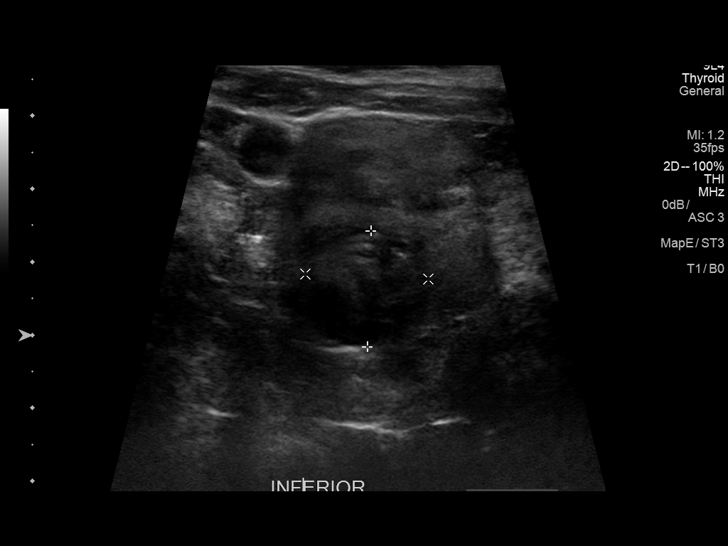
[im 39/39]
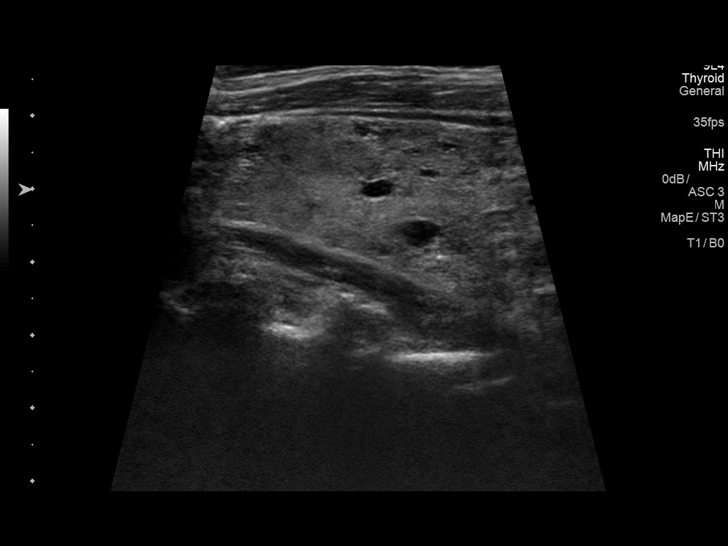

[13 of 25 positions shown; findings below may reference images not displayed]

FINDINGS: Parenchymal Echotexture: Moderately heterogenous

Isthmus: 0.5 cm

Right lobe: 6.5 x 3.1 x 3.8 cm

Left lobe: 6.6 x 2.9 x 2.5 cm

_________________________________________________________

Estimated total number of nodules >/= 1 cm: 1

Number of spongiform nodules >/=  2 cm not described below (TR1): 0

Number of mixed cystic and solid nodules >/= 1.5 cm not described
below (TR2): 0

_________________________________________________________

Diffusely heterogeneous and enlarged thyroid gland. A solitary
hypoechoic solid nodule in the right inferior gland which was
previously biopsied measures 2.0 x 1.7 x 1.6 cm which is
insignificantly changed compared to 2.1 x 1.8 x 1.5 cm previously.
IMPRESSION: 1. No significant interval change in the size or appearance of the
previously biopsied right inferior thyroid nodule. Recommend
correlation with prior biopsy results.
2. Stable appearance of diffusely heterogeneous and enlarged thyroid
gland.

The above is in keeping with the ACR TI-RADS recommendations - [HOSPITAL] 1734;[DATE].

## 2020-05-07 ENCOUNTER — Other Ambulatory Visit: Payer: Self-pay | Admitting: Family

## 2020-05-07 DIAGNOSIS — Z1231 Encounter for screening mammogram for malignant neoplasm of breast: Secondary | ICD-10-CM

## 2020-05-28 ENCOUNTER — Other Ambulatory Visit: Payer: Self-pay

## 2020-05-28 ENCOUNTER — Ambulatory Visit
Admission: RE | Admit: 2020-05-28 | Discharge: 2020-05-28 | Disposition: A | Discharge status: Home / Self Care | Source: Ambulatory Visit | Attending: Family | Admitting: Family

## 2020-05-28 DIAGNOSIS — Z1231 Encounter for screening mammogram for malignant neoplasm of breast: Secondary | ICD-10-CM

## 2020-07-23 ENCOUNTER — Other Ambulatory Visit: Payer: Self-pay

## 2020-07-23 ENCOUNTER — Ambulatory Visit (INDEPENDENT_AMBULATORY_CARE_PROVIDER_SITE_OTHER): Admitting: Family

## 2020-07-23 ENCOUNTER — Encounter: Payer: Self-pay | Admitting: Family

## 2020-07-23 ENCOUNTER — Telehealth: Payer: Self-pay | Admitting: Family

## 2020-07-23 VITALS — BP 135/56 | HR 64 | Temp 98.7°F | Resp 16 | Ht 67.0 in | Wt 227.0 lb

## 2020-07-23 DIAGNOSIS — I10 Essential (primary) hypertension: Secondary | ICD-10-CM

## 2020-07-23 DIAGNOSIS — E785 Hyperlipidemia, unspecified: Secondary | ICD-10-CM

## 2020-07-23 DIAGNOSIS — R809 Proteinuria, unspecified: Secondary | ICD-10-CM | POA: Diagnosis not present

## 2020-07-23 DIAGNOSIS — M542 Cervicalgia: Secondary | ICD-10-CM

## 2020-07-23 DIAGNOSIS — E1129 Type 2 diabetes mellitus with other diabetic kidney complication: Secondary | ICD-10-CM

## 2020-07-23 LAB — BASIC METABOLIC PANEL
BUN: 19 mg/dL (ref 6–23)
CO2: 32 mEq/L (ref 19–32)
Calcium: 9.6 mg/dL (ref 8.4–10.5)
Chloride: 106 mEq/L (ref 96–112)
Creatinine, Ser: 0.86 mg/dL (ref 0.40–1.20)
GFR: 71.74 mL/min (ref >60.00)
Glucose, Bld: 121 mg/dL — ABNORMAL HIGH (ref 70–99)
Potassium: 4 mEq/L (ref 3.5–5.1)
Sodium: 143 mEq/L (ref 135–145)

## 2020-07-23 LAB — HEMOGLOBIN A1C: Hgb A1c MFr Bld: 6.8 % — ABNORMAL HIGH (ref 4.6–6.5)

## 2020-07-23 MED ORDER — ATORVASTATIN CALCIUM 10 MG PO TABS
10.0000 mg | ORAL_TABLET | Freq: Every day | ORAL | 0 refills | Status: DC
Start: 2020-07-23 — End: 2020-10-08

## 2020-07-23 MED ORDER — LISINOPRIL 2.5 MG PO TABS
2.5000 mg | ORAL_TABLET | Freq: Every day | ORAL | 1 refills | Status: DC
Start: 2020-07-23 — End: 2020-11-12

## 2020-07-23 MED ORDER — AMLODIPINE BESYLATE 10 MG PO TABS
10.0000 mg | ORAL_TABLET | Freq: Every day | ORAL | 0 refills | Status: DC
Start: 2020-07-23 — End: 2020-11-12

## 2020-07-23 NOTE — Patient Instructions (Addendum)
Please ask your eye doctor to fax your last eye exam to [phone]. Please complete lab work prior to leaving.

## 2020-07-23 NOTE — Telephone Encounter (Signed)
Opened in error

## 2020-07-23 NOTE — Progress Notes (Signed)
Subjective:    Patient ID: Nancy Deleon, female    DOB: 12-30-1955, 64 y.o.   MRN: [id number]  HPI  Patient is a 64 yr old female who presents today for follow up.  Reports that she has mild headache, woozy feeling- started after she at pork on Tuesday night. Denies fever, loss of taste/smell. Thinks she is worn out.  HTN- maintained on amlodipine 84m, lisinopril 2.547m  BP Readings from Last 3 Encounters:  07/23/20 (!) 135/56  04/21/20 (!) 141/54  01/01/20 129/73   DM2- reports diet is "not too good" but she plans to work on it.  Lab Results  Component Value Date   HGBA1C 6.7 (H) 04/21/2020   HGBA1C 6.2 07/02/2019   HGBA1C 6.3 03/18/2019   Lab Results  Component Value Date   MICROALBUR 2.2 (H) 03/18/2019   LDLCALC 69 04/21/2020   CREATININE 0.95 04/21/2020   Uses celebrex as needed for neck pain which she finds helpful.  Hyperlipidemia- maintained on lipitor 1023m Lab Results  Component Value Date   CHOL 151 04/21/2020   HDL 66 04/21/2020   LDLCALC 69 04/21/2020   TRIG 77 04/21/2020   CHOLHDL 2.3 04/21/2020      Review of Systems See HPI  Past Medical History:  Diagnosis Date  . Allergy   . Fatty liver 04/03/2014  . GERD (gastroesophageal reflux disease)   . Hypertension   . 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 Multiple thyroid nodules 08/20/2017  . Obesity   . Type 2 diabetes mellitus, controlled, with renal complications ([place]/77/7/41425/13/21-pt states she does not have diabetes, just checks blood sugar as needed.     Social History   Socioeconomic History  . Marital status: Widowed    Spouse name: Not on file  . Number of children: 2  . Years of education: Not on file  . Highest education level: Not on file  Occupational History    Employer: [employer]  Tobacco Use  . Smoking status: Never Smoker  . Smokeless tobacco: Never Used  Vaping Use  . Vaping Use: Never used  Substance and Sexual Activity  . Alcohol  use: Yes    Alcohol/week: 0.0 standard drinks    Comment: occasional wine  . Drug use: No  . Sexual activity: Yes    Partners: Male    Birth control/protection: Post-menopausal    Comment: 1st intercourse 18 3-Fewer than 5 partners  Other Topics Concern  . Not on file  Social History Narrative   Regular exercise:  Walks 2-5 x weekly   Caffeine Use: no   Widowed   She has a son JamJeneen Rinkse is 26 62s old.   Foster parent.   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   Dog (Lab named Abby)         Social Determinants of Health   Financial Resource Strain: Not on file  Food Insecurity: Not on file  Transportation Needs: Not on file  Physical Activity: Not on file  Stress: Not on file  Social Connections: Not on file  Intimate Partner Violence: Not on file    Past Surgical History:  Procedure Laterality Date  . BIOPSY THYROID    . BREAST BIOPSY  04/24/11   [facility] breast  . BREAST EXCISIONAL BIOPSY    . BREAST LUMPECTOMY WITH RADIOACTIVE SEED LOCALIZATION Left 05/01/2017   Procedure: LEFT BREAST LUMPECTOMY  WITH RADIOACTIVE SEED LOCALIZATION;  Surgeon: Donnie Mesa, MD;  Location: [facility];  Service: General;  Laterality: Left;  . CESAREAN SECTION  [id number]  . COLONOSCOPY  2011   [facility]-  normal  . TUBAL LIGATION    . UPPER GASTROINTESTINAL ENDOSCOPY      Family History  Problem Relation Age of Onset  . Lymphoma Mother   . Dementia Mother   . Cancer Father        lung  . Colon cancer Maternal Uncle   . Esophageal cancer Neg Hx   . Pancreatic cancer Neg Hx   . Rectal cancer Neg Hx   . Stomach cancer Neg Hx   . Thyroid disease Neg Hx   . Colon polyps Neg Hx     Allergies  Allergen Reactions  . Naproxen Nausea And Vomiting    REACTION: vomiting  . Ropinirole Nausea Only  . Vicodin [Hydrocodone-Acetaminophen] Nausea And Vomiting  . Propoxyphene N-Acetaminophen Nausea And Vomiting     Current Outpatient Medications on File Prior to Visit  Medication Sig Dispense Refill  . acetaminophen (TYLENOL) 500 MG tablet Take 1,000 mg by mouth every 6 (six) hours as needed.    Marland Kitchen amLODipine (NORVASC) 10 MG tablet Take 1 tablet (10 mg total) by mouth daily. 90 tablet 1  . atorvastatin (LIPITOR) 10 MG tablet Take 1 tablet (10 mg total) by mouth daily. 90 tablet 1  . blood glucose meter kit and supplies KIT Dispense based on patient and insurance preference. Check sugar 1-2 times daily.  (FOR ICD-9 250.00, 250.) 1 each 0  . calcium-vitamin D (OSCAL WITH D) 500-200 MG-UNIT tablet Take 1 tablet by mouth.    . celecoxib (CELEBREX) 200 MG capsule Take 1 capsule (200 mg total) by mouth daily as needed. 30 capsule 0  . lisinopril (ZESTRIL) 2.5 MG tablet TAKE 1 TABLET DAILY 90 tablet 1  . Multiple Vitamins-Minerals (CENTRUM ULTRA WOMENS) TABS Take 1 tablet by mouth daily.     Current Facility-Administered Medications on File Prior to Visit  Medication Dose Route Frequency Provider Last Rate Last Admin  . 0.9 %  sodium chloride infusion  500 mL Intravenous Once Ladene Artist, MD        BP (!) 135/56 (BP Location: Right Arm, Patient Position: Sitting, Cuff Size: Large)   Pulse 64   Temp 98.7 F (37.1 C) (Oral)   Resp 16   Ht 5' 7"  (1.702 m)   Wt 227 lb (103 kg)   LMP 02/28/2007   SpO2 99%   BMI 35.55 kg/m       Objective:   Physical Exam Constitutional:      Appearance: She is well-developed and well-nourished.  Neck:     Thyroid: No thyromegaly.  Cardiovascular:     Rate and Rhythm: Normal rate and regular rhythm.     Heart sounds: Normal heart sounds. No murmur heard.   Pulmonary:     Effort: Pulmonary effort is normal. No respiratory distress.     Breath sounds: Normal breath sounds. No wheezing.  Musculoskeletal:     Cervical back: Neck supple.  Skin:    General: Skin is warm and dry.  Neurological:     Mental Status: She is alert and oriented to person,  place, and time.  Psychiatric:        Mood and Affect: Mood and affect normal.        Behavior: Behavior normal.        Thought  Content: Thought content normal.        Judgment: Judgment normal.           Assessment & Plan:  DM2- clinically stable. Discussed diet. Obtain A1C.  HTN- bp stable, continue amlodipine 73m once daily and lisinopril 2.56monce daily.   Hyperlipidemia- LDL at goal. Continue lipitor 1059monce daily.   Chronic neck pain- stable with prn use of celebrex.  This visit occurred during the SARS-CoV-2 public health emergency.  Safety protocols were in place, including screening questions prior to the visit, additional usage of staff PPE, and extensive cleaning of exam room while observing appropriate contact time as indicated for disinfecting solutions.

## 2020-08-27 ENCOUNTER — Telehealth (INDEPENDENT_AMBULATORY_CARE_PROVIDER_SITE_OTHER): Admitting: Family Medicine

## 2020-08-27 DIAGNOSIS — R5383 Other fatigue: Secondary | ICD-10-CM | POA: Diagnosis not present

## 2020-08-27 DIAGNOSIS — R0981 Nasal congestion: Secondary | ICD-10-CM | POA: Diagnosis not present

## 2020-08-27 MED ORDER — AMOXICILLIN-POT CLAVULANATE 875-125 MG PO TABS: 1.0000 | ORAL_TABLET | Freq: Two times a day (BID) | ORAL | 0 refills | Status: DC

## 2020-08-27 NOTE — Progress Notes (Signed)
Virtual Visit via Video Note  I connected with Nancy Deleon  on 08/27/20 at  1:20 PM EST by a video enabled telemedicine application and verified that I am speaking with the correct person using two identifiers.  Location patient: home, [place] Location provider:work or home office Persons participating in the virtual visit: patient, provider  I discussed the limitations of evaluation and management by telemedicine and the availability of in person appointments. The patient expressed understanding and agreed to proceed.   HPI:  Acute telemedicine visit for fatigue and sinus congestion: -Onset: for a few weeks -Symptoms include: sinus congestion, drainage, headache, feels more tired than usually, sinus pressure, feels off balance a little -feels like this started around the same time as she got her covid booster -Denies: fevers, weight loss, CP, SOB, NVD, rashes -Has tried: claritin prn -Pertinent past medical history: allergic rhinitis -Pertinent medication allergies: reviewed, no abx allergies -COVID-19 vaccine status:fully vaccinated +booster  ROS: See pertinent positives and negatives per HPI.  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    Past Surgical History:  Procedure Laterality Date   BIOPSY THYROID     BREAST BIOPSY  04/24/11   [facility] breast   BREAST EXCISIONAL BIOPSY     BREAST LUMPECTOMY WITH RADIOACTIVE SEED LOCALIZATION Left 05/01/2017   Procedure: LEFT BREAST LUMPECTOMY WITH RADIOACTIVE SEED LOCALIZATION;  Surgeon: Donnie Mesa, MD;  Location: [facility];  Service: General;  Laterality: Left;   CESAREAN  SECTION  [id number]   COLONOSCOPY  2011   [facility] [facility]-  normal   TUBAL LIGATION     UPPER GASTROINTESTINAL ENDOSCOPY       Current Outpatient Medications:    amoxicillin-clavulanate (AUGMENTIN) 875-125 MG tablet, Take 1 tablet by mouth 2 (two) times daily., Disp: 20 tablet, Rfl: 0   acetaminophen (TYLENOL) 500 MG tablet, Take 1,000 mg by mouth every 6 (six) hours as needed., Disp: , Rfl:    amLODipine (NORVASC) 10 MG tablet, Take 1 tablet (10 mg total) by mouth daily., Disp: 90 tablet, Rfl: 0   atorvastatin (LIPITOR) 10 MG tablet, Take 1 tablet (10 mg total) by mouth daily., Disp: 90 tablet, Rfl: 0   blood glucose meter kit and supplies KIT, Dispense based on patient and insurance preference. Check sugar 1-2 times daily.  (FOR ICD-9 250.00, 250.), Disp: 1 each, Rfl: 0   calcium-vitamin D (OSCAL WITH D) 500-200 MG-UNIT tablet, Take 1 tablet by mouth., Disp: , Rfl:    celecoxib (CELEBREX) 200 MG capsule, Take 1 capsule (200 mg total) by mouth daily as needed., Disp: 30 capsule, Rfl: 0   lisinopril (ZESTRIL) 2.5 MG tablet, Take 1 tablet (2.5 mg total) by mouth daily., Disp: 90 tablet, Rfl: 1   Multiple Vitamins-Minerals (CENTRUM ULTRA WOMENS) TABS, Take 1 tablet by mouth daily., Disp: , Rfl:   Current Facility-Administered Medications:    0.9 %  sodium chloride infusion, 500 mL, Intravenous, Once, Ladene Artist, MD  EXAM:  VITALS per patient if applicable:  GENERAL: alert, oriented, appears well and in no acute distress  HEENT: atraumatic, conjunttiva clear, no obvious abnormalities on inspection of external nose and  ears, sound congested, lots of sniffling  NECK: normal movements of the head and neck  LUNGS: on inspection no signs of respiratory distress, breathing rate appears normal, no obvious gross SOB, gasping or wheezing  CV: no obvious cyanosis  MS: moves all visible extremities without noticeable abnormality  PSYCH/NEURO: pleasant and cooperative, no obvious  depression or anxiety, speech and thought processing grossly intact  ASSESSMENT AND PLAN:  Discussed the following assessment and plan:  Sinus congestion  Other fatigue  -we discussed possible serious and likely etiologies, options for evaluation and workup, limitations of telemedicine visit vs in person visit, treatment, treatment risks and precautions. Pt prefers to treat via telemedicine empirically rather than in person at this moment.  Query sinusitis from an treated allergies given duration of symptoms and other symptoms, versus other.  Opted for starting Claritin on a daily basis for the allergies and discussed and intranasal steroid and proper use to prevent nosebleeds.  For the possible sinusitis, opted for treatment with Augmentin 875 twice daily for 10 days.  Did discuss the possibility of other causes of fatigue and advised if not feeling better promptly with this treatment that she would need an in person follow-up with her primary care doctor.  Advised to seek prompt in person care if worsening, new symptoms arise, or if is not improving with treatment. Discussed options for inperson care if PCP office not available. Did let this patient know that I only do telemedicine on Tuesdays and Thursdays for [facility]. Advised to schedule follow up visit with PCP or [facility] if any further questions or concerns to avoid delays in care.   I discussed the assessment and treatment plan with the patient. The patient was provided an opportunity to ask questions and all were answered. The patient agreed with the plan and demonstrated an understanding of the instructions.     Lucretia Kern, DO

## 2020-08-27 NOTE — Patient Instructions (Signed)
-  I sent the medication(s) we discussed to your pharmacy: Meds ordered this encounter  Medications   amoxicillin-clavulanate (AUGMENTIN) 875-125 MG tablet    Sig: Take 1 tablet by mouth 2 (two) times daily.    Dispense:  20 tablet    Refill:  0   Start Claritin once daily.  Nasal saline twice daily can also help.  I hope you are feeling better soon!  Seek follow-up with your primary care doctor or in person care elsewhere promptly if your symptoms worsen, new concerns arise or you are not improving with treatment.  It was nice to meet you today. I help [facility]  out with telemedicine visits on Tuesdays and Thursdays and am available for visits on those days. If you have any concerns or questions following this visit please schedule a follow up visit with your Primary Care doctor or seek care at a local urgent care clinic to avoid delays in care.

## 2020-09-07 HISTORY — PX: OTHER SURGICAL HISTORY: SHX169

## 2020-09-09 ENCOUNTER — Ambulatory Visit (INDEPENDENT_AMBULATORY_CARE_PROVIDER_SITE_OTHER): Admitting: Sports Medicine

## 2020-09-09 ENCOUNTER — Other Ambulatory Visit: Payer: Self-pay

## 2020-09-09 ENCOUNTER — Encounter: Payer: Self-pay | Admitting: Sports Medicine

## 2020-09-09 DIAGNOSIS — M79674 Pain in right toe(s): Secondary | ICD-10-CM

## 2020-09-09 DIAGNOSIS — L603 Nail dystrophy: Secondary | ICD-10-CM | POA: Diagnosis not present

## 2020-09-09 NOTE — Progress Notes (Signed)
Subjective: Nancy Deleon is a 65 y.o. female patient presents to office today complaining of a moderately painful thick Right great toenail that hurts with pressure/touch. This has been present for several months. Patient has treated this by trimming it. Patient requests nail removal. Patient denies fever/chills/nausea/vomitting/any other related constitutional symptoms at this time.  Patient Active Problem List   Diagnosis Date Noted  . Type 2 diabetes mellitus, controlled, with renal complications ([place]) 76/73/4193  . Anxiety 09/12/2017  . Multiple thyroid nodules 08/20/2017  . 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 Allergic rhinitis 11/24/2015  . Back pain of thoracolumbar region 08/10/2014  . Renal cyst, left 08/10/2014  . Musculoskeletal pain 05/14/2014  . Fatty liver 04/03/2014  . Abdominal pain, epigastric 04/01/2014  . Second degree AV block, Mobitz type I 03/23/2014  . GERD (gastroesophageal reflux disease) 02/09/2014  . Nonspecific abnormal electrocardiogram (ECG) (EKG) 02/09/2014  . Pain in joint, ankle and foot 11/17/2013  . Headache(784.0) 08/15/2013  . Routine general medical examination at a health care facility 02/21/2012  . Skin rash 12/18/2011  . Seasonal allergies 11/21/2011  . ANEMIA, IRON DEFICIENCY 12/30/2008  . Hyperglycemia 10/31/2007  . Essential hypertension 10/31/2007  . Impaired fasting glucose 10/31/2007    Current Outpatient Medications on File Prior to Visit  Medication Sig Dispense Refill  . acetaminophen (TYLENOL) 500 MG tablet Take 1,000 mg by mouth every 6 (six) hours as needed.    Marland Kitchen amLODipine (NORVASC) 10 MG tablet Take 1 tablet (10 mg total) by mouth daily. 90 tablet 0  . amoxicillin-clavulanate (AUGMENTIN) 875-125 MG tablet Take 1 tablet by mouth 2 (two) times daily. 20 tablet 0  . atorvastatin (LIPITOR) 10 MG tablet Take 1 tablet (10 mg total) by mouth daily. 90 tablet 0  . blood glucose meter kit and supplies KIT Dispense  based on patient and insurance preference. Check sugar 1-2 times daily.  (FOR ICD-9 250.00, 250.) 1 each 0  . calcium-vitamin D (OSCAL WITH D) 500-200 MG-UNIT tablet Take 1 tablet by mouth.    . celecoxib (CELEBREX) 200 MG capsule Take 1 capsule (200 mg total) by mouth daily as needed. 30 capsule 0  . lisinopril (ZESTRIL) 2.5 MG tablet Take 1 tablet (2.5 mg total) by mouth daily. 90 tablet 1  . Multiple Vitamins-Minerals (CENTRUM ULTRA WOMENS) TABS Take 1 tablet by mouth daily.     Current Facility-Administered Medications on File Prior to Visit  Medication Dose Route Frequency Provider Last Rate Last Admin  . 0.9 %  sodium chloride infusion  500 mL Intravenous Once Ladene Artist, MD        Allergies  Allergen Reactions  . Naproxen Nausea And Vomiting    REACTION: vomiting  . Ropinirole Nausea Only  . Vicodin [Hydrocodone-Acetaminophen] Nausea And Vomiting  . Propoxyphene N-Acetaminophen Nausea And Vomiting    Objective:  There were no vitals filed for this visit.  General: Well developed, nourished, in no acute distress, alert and oriented x3   Dermatology: Skin is warm, dry and supple bilateral. Right hallux nail appears to be  severely thickened with rams horn appearance with hyperkeratosis at the nail border. (-) Erythema. (-) Edema. (-) serosanguous  drainage present. The remaining nails appear unremarkable at this time. There are no open sores, lesions or other signs of infection  present.  Vascular: Dorsalis Pedis artery and Posterior Tibial artery pedal pulses are 1/4 bilateral with immedate capillary fill time. Pedal hair growth present. No lower extremity edema.   Neruologic: Grossly  intact via light touch bilateral.  Musculoskeletal: Tenderness to palpation of the Right hallux nail. Muscular strength within normal limits in all groups bilateral.   Assesement and Plan: Problem List Items Addressed This Visit   None   Visit Diagnoses    Onychodystrophy    -   Primary   Toe pain, right          -Discussed treatment alternatives and plan of care; Explained permanent/temporary nail avulsion and post procedure course to patient. Patient elects for total removal of right hallux nail - After a verbal and written consent, injected 3 ml of a 50:50 mixture of 2% plain  lidocaine and 0.5% plain marcaine in a normal hallux block fashion. Next, a  betadine prep was performed. Anesthesia was tested and found to be appropriate.  The offending right hallux nail completely was then incised from the hyponychium to the epinychium. The offending nail was removed and cleared from the field. The area was curretted for any remaining nail or spicules. Phenol application performed and the area was then flushed with alcohol and dressed with antibiotic cream and a dry sterile dressing. -Patient was instructed to leave the dressing intact for today and begin soaking  in a weak solution of betadine or Epsom salt and water tomorrow. Patient was instructed to  soak for 15-20 minutes each day and apply neosporin/corticosporin and a gauze or bandaid dressing each day. -Patient was instructed to monitor the toe for signs of infection and return to office if toe becomes red, hot or swollen. -Advised ice, elevation, and tylenol or motrin if needed for pain.  -Work excuse provided  -Patient is to return in 2 weeks for follow up care/nail check or sooner if problems arise.  Landis Martins, DPM

## 2020-09-09 NOTE — Patient Instructions (Signed)

## 2020-09-13 ENCOUNTER — Telehealth: Payer: Self-pay | Admitting: Sports Medicine

## 2020-09-13 NOTE — Telephone Encounter (Signed)
Learta Codding, CMA is already working on this. She can pick up note on Wednesday. -Dr. Cannon Kettle

## 2020-09-13 NOTE — Telephone Encounter (Signed)
Patient requesting an "out of work" note due to toenail pain (cannnot wear shoe). Patient has an upcoming appointment with Amalia Hailey. Please contact patient ASAP, patient can pick up note at appointment Wednesday.

## 2020-09-15 ENCOUNTER — Encounter: Payer: Self-pay | Admitting: Podiatry

## 2020-09-15 ENCOUNTER — Ambulatory Visit (INDEPENDENT_AMBULATORY_CARE_PROVIDER_SITE_OTHER): Admitting: Podiatry

## 2020-09-15 ENCOUNTER — Other Ambulatory Visit: Payer: Self-pay

## 2020-09-15 DIAGNOSIS — M79674 Pain in right toe(s): Secondary | ICD-10-CM

## 2020-09-15 DIAGNOSIS — L603 Nail dystrophy: Secondary | ICD-10-CM

## 2020-09-15 MED ORDER — TRAMADOL HCL 50 MG PO TABS: 50.0000 mg | ORAL_TABLET | Freq: Four times a day (QID) | ORAL | 0 refills | Status: AC | PRN

## 2020-09-23 ENCOUNTER — Ambulatory Visit: Admitting: Sports Medicine

## 2020-09-29 ENCOUNTER — Ambulatory Visit (INDEPENDENT_AMBULATORY_CARE_PROVIDER_SITE_OTHER): Admitting: Podiatry

## 2020-09-29 ENCOUNTER — Other Ambulatory Visit: Payer: Self-pay

## 2020-09-29 DIAGNOSIS — L603 Nail dystrophy: Secondary | ICD-10-CM

## 2020-09-29 DIAGNOSIS — M79674 Pain in right toe(s): Secondary | ICD-10-CM | POA: Diagnosis not present

## 2020-09-29 NOTE — Progress Notes (Signed)
   Subjective: 65 y.o. female presents today status post permanent nail avulsion procedure of the right great toenail that was performed on 09/15/2020.  Patient states that she continues to have some tenderness and pain to the nail avulsion site.  She has been soaking her foot and applying antibiotic cream as instructed.  No new complaints at this time  Past Medical History:  Diagnosis Date  . Allergy   . Fatty liver 04/03/2014  . GERD (gastroesophageal reflux disease)   . Hypertension   . 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 Multiple thyroid nodules 08/20/2017  . Obesity   . Type 2 diabetes mellitus, controlled, with renal complications ([facility]) 03/08/5052   12/18/19-pt states she does not have diabetes, just checks blood sugar as needed.    Objective: Skin is warm, dry and supple. Nail bed appears to be healing appropriately. Open wound to the associated nail bed with a granular wound base and minimal amount of fibrotic tissue. Minimal drainage noted. Mild erythema around the periungual region likely due to phenol chemical matricectomy.  Assessment: #1 postop permanent total nail avulsion right hallux  Plan of care: #1 patient was evaluated  #2  Light debridement of open wound was performed to the periungual border of the respective toe using a currette. Antibiotic ointment and Band-Aid was applied. #3 patient is to return to clinic on a PRN basis.   Edrick Kins, DPM [facility]  Dr. Edrick Kins, DPM    [address], [place] [address]                Office [phone]  Fax [phone]

## 2020-10-04 NOTE — Progress Notes (Signed)
   HPI: 65 y.o. female presenting today for follow-up evaluation of a nail avulsion to the right great toenail performed by Dr. Cannon Kettle on 09/09/2020.  Patient continues to have some pain and tenderness.  She has been soaking her foot as instructed.  No new complaints at this time  Past Medical History:  Diagnosis Date  . Allergy   . Fatty liver 04/03/2014  . GERD (gastroesophageal reflux disease)   . Hypertension   . 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 Multiple thyroid nodules 08/20/2017  . Obesity   . Type 2 diabetes mellitus, controlled, with renal complications ([place]) 12/11/4330   12/18/19-pt states she does not have diabetes, just checks blood sugar as needed.     Physical Exam: General: The patient is alert and oriented x3 in no acute distress.  Dermatology: Absence of the right hallux nail plate noted.  Wound base is granular.  No malodor or clinical evidence of infection.  There is some's mild erythema around the base of the nail likely consistent with a phenol reaction.  Vascular: Palpable pedal pulses bilaterally. No edema or erythema noted. Capillary refill within normal limits.  Neurological: Epicritic and protective threshold grossly intact bilaterally.   Musculoskeletal Exam: No pedal deformities   Assessment: 1.  Status post total permanent nail avulsion right hallux   Plan of Care:  1. Patient evaluated. 2.  Silvadene cream provided.  Apply daily 3.  Prescription for tramadol 50 mg 4.  Return to work date 11/08/2020 5.  Return to clinic in 3 weeks with Dr. Cannon Kettle for final follow-up      Edrick Kins, DPM [facility]  Dr. Edrick Kins, DPM    [address], [place] [address]                Office [phone]  Fax [phone]

## 2020-10-08 ENCOUNTER — Other Ambulatory Visit: Payer: Self-pay | Admitting: Family

## 2020-10-11 DIAGNOSIS — M79676 Pain in unspecified toe(s): Secondary | ICD-10-CM

## 2020-11-01 ENCOUNTER — Telehealth: Payer: Self-pay | Admitting: Sports Medicine

## 2020-11-01 NOTE — Telephone Encounter (Signed)
Patient is scheduled to return to work 11/08/20, but says that she needs more time out. Mrs. Epple said she is still having pain and its still a little swollen. But, the biggest thing is she is unable to put on a shoe. Please advise?

## 2020-11-01 NOTE — Telephone Encounter (Signed)
My fault rtw scheduled for 4/14

## 2020-11-01 NOTE — Telephone Encounter (Signed)
We can extend out of work for 1 more week to return on 11/25/20

## 2020-11-12 ENCOUNTER — Other Ambulatory Visit: Payer: Self-pay

## 2020-11-12 ENCOUNTER — Encounter: Payer: Self-pay | Admitting: Family

## 2020-11-12 ENCOUNTER — Telehealth: Payer: Self-pay | Admitting: Family

## 2020-11-12 ENCOUNTER — Ambulatory Visit (INDEPENDENT_AMBULATORY_CARE_PROVIDER_SITE_OTHER): Admitting: Family

## 2020-11-12 VITALS — BP 136/59 | HR 65 | Temp 98.5°F | Resp 16 | Ht 67.0 in | Wt 230.0 lb

## 2020-11-12 DIAGNOSIS — J302 Other seasonal allergic rhinitis: Secondary | ICD-10-CM

## 2020-11-12 DIAGNOSIS — E1129 Type 2 diabetes mellitus with other diabetic kidney complication: Secondary | ICD-10-CM | POA: Diagnosis not present

## 2020-11-12 DIAGNOSIS — Z0000 Encounter for general adult medical examination without abnormal findings: Secondary | ICD-10-CM

## 2020-11-12 DIAGNOSIS — R809 Proteinuria, unspecified: Secondary | ICD-10-CM

## 2020-11-12 LAB — COMPREHENSIVE METABOLIC PANEL
ALT: 16 U/L (ref 0–35)
AST: 15 U/L (ref 0–37)
Albumin: 4.2 g/dL (ref 3.5–5.2)
Alkaline Phosphatase: 76 U/L (ref 39–117)
BUN: 9 mg/dL (ref 6–23)
CO2: 31 mEq/L (ref 19–32)
Calcium: 9.9 mg/dL (ref 8.4–10.5)
Chloride: 104 mEq/L (ref 96–112)
Creatinine, Ser: 0.85 mg/dL (ref 0.40–1.20)
GFR: 72.6 mL/min (ref >60.00)
Glucose, Bld: 154 mg/dL — ABNORMAL HIGH (ref 70–99)
Potassium: 3.9 mEq/L (ref 3.5–5.1)
Sodium: 140 mEq/L (ref 135–145)
Total Bilirubin: 0.5 mg/dL (ref 0.2–1.2)
Total Protein: 7 g/dL (ref 6.0–8.3)

## 2020-11-12 LAB — HEMOGLOBIN A1C: Hgb A1c MFr Bld: 6.8 % — ABNORMAL HIGH (ref 4.6–6.5)

## 2020-11-12 MED ORDER — MONTELUKAST SODIUM 10 MG PO TABS: 10.0000 mg | ORAL_TABLET | Freq: Every day | ORAL | 3 refills | Status: DC

## 2020-11-12 MED ORDER — LISINOPRIL 2.5 MG PO TABS: 2.5000 mg | ORAL_TABLET | Freq: Every day | ORAL | 1 refills | Status: DC

## 2020-11-12 MED ORDER — AMLODIPINE BESYLATE 10 MG PO TABS: 10.0000 mg | ORAL_TABLET | Freq: Every day | ORAL | 0 refills | Status: DC

## 2020-11-12 NOTE — Telephone Encounter (Signed)
Also, please call her [facility] and request date of Shingrix #2.

## 2020-11-12 NOTE — Patient Instructions (Signed)
Please complete lab work prior to leaving.   

## 2020-11-12 NOTE — Telephone Encounter (Signed)
Please call Dr. Warner Mccreedy (optometrist) and request a copy of last DM eye exam.

## 2020-11-12 NOTE — Assessment & Plan Note (Signed)
Obtain A1C. 

## 2020-11-12 NOTE — Assessment & Plan Note (Signed)
Discussed healthy diet, exercise.  Mammo/pap up to date. She will give [place] a copy of her 3rd covid shot.  Will request DM eye exam.  [place] up to date.

## 2020-11-12 NOTE — Telephone Encounter (Signed)
Record updated

## 2020-11-12 NOTE — Telephone Encounter (Signed)
FYI  Patient calling to let you know her 3rd booster shot was on 07/23/20

## 2020-11-12 NOTE — Progress Notes (Signed)
Subjective:   By signing my name below, I, Lucille Passy, attest that this documentation has been prepared under the direction and in the presence of  [name]. 11/12/20    Patient ID: Nancy Deleon, female    DOB: 10-31-1955, 65 y.o.   MRN: [id number]  Chief Complaint  Patient presents with  . Annual Exam         HPI Patient is in today for comprehensive physical exam.   Seasonal Allergies: She reports that her allergies are her main concern, she is taking OTC medications to treat theses symptoms. She has a cough and nasal drainage that continues to worsen because the OTC has not helped. She does not use any OTC nasal sprays due to her having nose bleeds.  Hypertension: She is tolerating 2.5 mg of Lisinopril daily PO to manage her hypertension. BP Readings from Last 3 Encounters:  11/12/20 (!) 136/59  07/23/20 (!) 135/56  04/21/20 (!) 141/54     Hyperlipidemia: She is tolerating 10 mg of atorvastatin daily PO to manage her cholesterol.  Lab Results  Component Value Date   CHOL 151 04/21/2020   CHOL 142 07/02/2019   CHOL 175 03/18/2019   Lab Results  Component Value Date   HDL 66 04/21/2020   HDL 59.50 07/02/2019   HDL 58.10 03/18/2019   Lab Results  Component Value Date   LDLCALC 69 04/21/2020   LDLCALC 69 07/02/2019   LDLCALC 102 (H) 03/18/2019   Lab Results  Component Value Date   TRIG 77 04/21/2020   TRIG 68.0 07/02/2019   TRIG 74.0 03/18/2019   Lab Results  Component Value Date   CHOLHDL 2.3 04/21/2020   CHOLHDL 2 07/02/2019   CHOLHDL 3 03/18/2019   No results found for: LDLDIRECT   Immunizations: UTD on vaccinations   Diet: Healthy diet  Exercise: She has been out of work for 8 weeks due having a procedure on her 1st digit in 2/22. Her toenail was removed.   Colonoscopy: Last completed 01/01/20 and results are normal. Repeat in 10 years.  Dexa: Last completed on 09/20/18 and results are normal  Pap Smear: Last completed on  3/42020 and result are normal.  Mammogram: Last completed on 05/26/20 and results are normal . Repeat in 1 year.  Visual: She had her last eye exam last year by her optometrist, Dr. Warner Mccreedy. She plans to have another exam soon.   She denies any new skin changes, depression, anxiety, swollen glands, nausea, vomiting, diarrhea, chest pain, tightness, or pressure, back pain, joint pain, abdominal pain at this time.   Past Medical History:  Diagnosis Date  . Allergy   . Fatty liver 04/03/2014  . GERD (gastroesophageal reflux disease)   . Hypertension   . 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 Multiple thyroid nodules 08/20/2017  . Obesity   . Type 2 diabetes mellitus, controlled, with renal complications ([place]) 03/12/7618   12/18/19-pt states she does not have diabetes, just checks blood sugar as needed.    Past Surgical History:  Procedure Laterality Date  . BIOPSY THYROID    . BREAST BIOPSY  04/24/11   [facility] breast  . BREAST EXCISIONAL BIOPSY    . BREAST LUMPECTOMY WITH RADIOACTIVE SEED LOCALIZATION Left 05/01/2017   Procedure: LEFT BREAST LUMPECTOMY WITH RADIOACTIVE SEED LOCALIZATION;  Surgeon: Donnie Mesa, MD;  Location: [facility];  Service: General;  Laterality: Left;  . CESAREAN SECTION  [id number]  . COLONOSCOPY  2011   [facility]-  normal  . toenail removal  09/2020   Dr. Cannon Kettle  . TUBAL LIGATION    . UPPER GASTROINTESTINAL ENDOSCOPY      Family History  Problem Relation Age of Onset  . Lymphoma Mother   . Dementia Mother   . Cancer Father        lung  . Colon cancer Maternal Uncle   . Esophageal cancer Neg Hx   . Pancreatic cancer Neg Hx   . Rectal cancer Neg Hx   . Stomach cancer Neg Hx   . Thyroid disease Neg Hx   . Colon polyps Neg Hx     Social History   Socioeconomic History  . Marital status: Widowed    Spouse name: Not on file  . Number of children: 2  . Years of education: Not on file  . Highest education level: Not on file   Occupational History    Employer: [employer]  Tobacco Use  . Smoking status: Never Smoker  . Smokeless tobacco: Never Used  Vaping Use  . Vaping Use: Never used  Substance and Sexual Activity  . Alcohol use: Yes    Alcohol/week: 0.0 standard drinks    Comment: occasional wine  . Drug use: No  . Sexual activity: Yes    Partners: Male    Birth control/protection: Post-menopausal    Comment: 1st intercourse 65 yo-Fewer than 5 partners  Other Topics Concern  . Not on file  Social History Narrative   Regular exercise:  Walks 2-5 x weekly   Caffeine Use: no   Widowed   She has a son [name]- he is 58 yrs old.   Foster parent.   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   Dog (Lab named Abby)         Social Determinants of Health   Financial Resource Strain: Not on file  Food Insecurity: Not on file  Transportation Needs: Not on file  Physical Activity: Not on file  Stress: Not on file  Social Connections: Not on file  Intimate Partner Violence: Not on file    Outpatient Medications Prior to Visit  Medication Sig Dispense Refill  . acetaminophen (TYLENOL) 500 MG tablet Take 1,000 mg by mouth every 6 (six) hours as needed.    Marland Kitchen amoxicillin-clavulanate (AUGMENTIN) 875-125 MG tablet Take 1 tablet by mouth 2 (two) times daily. 20 tablet 0  . atorvastatin (LIPITOR) 10 MG tablet Take 1 tablet (10 mg total) by mouth daily. 90 tablet 1  . blood glucose meter kit and supplies KIT Dispense based on patient and insurance preference. Check sugar 1-2 times daily.  (FOR ICD-9 250.00, 250.) 1 each 0  . calcium-vitamin D (OSCAL WITH D) 500-200 MG-UNIT tablet Take 1 tablet by mouth.    . celecoxib (CELEBREX) 200 MG capsule Take 1 capsule (200 mg total) by mouth daily as needed. 30 capsule 0  . Multiple Vitamins-Minerals (CENTRUM ULTRA WOMENS) TABS Take 1 tablet by mouth daily.    Marland Kitchen amLODipine (NORVASC) 10 MG tablet Take 1 tablet  (10 mg total) by mouth daily. 90 tablet 0  . lisinopril (ZESTRIL) 2.5 MG tablet Take 1 tablet (2.5 mg total) by mouth daily. 90 tablet 1   Facility-Administered Medications Prior to Visit  Medication Dose Route Frequency Provider Last Rate Last Admin  . 0.9 %  sodium chloride infusion  500 mL Intravenous Once Ladene Artist, MD  Allergies  Allergen Reactions  . Naproxen Nausea And Vomiting    REACTION: vomiting  . Ropinirole Nausea Only  . Vicodin [Hydrocodone-Acetaminophen] Nausea And Vomiting  . Propoxyphene N-Acetaminophen Nausea And Vomiting    Review of Systems  HENT: Positive for congestion.        (+)Rhinorrhea   Respiratory: Positive for cough.        Objective:    Physical Exam Constitutional:      General: She is not in acute distress.    Appearance: Normal appearance. She is not ill-appearing.  HENT:     Head: Normocephalic and atraumatic.     Right Ear: Tympanic membrane and external ear normal.     Left Ear: Tympanic membrane and external ear normal.  Eyes:     Extraocular Movements: Extraocular movements intact.     Pupils: Pupils are equal, round, and reactive to light.     Comments: No nystagmus  Neck:     Thyroid: Thyromegaly (right > left) present.  Cardiovascular:     Rate and Rhythm: Normal rate and regular rhythm.     Pulses: Normal pulses.     Heart sounds: Normal heart sounds.  Pulmonary:     Effort: Pulmonary effort is normal. No respiratory distress.     Breath sounds: Normal breath sounds. No wheezing, rhonchi or rales.  Chest:  Breasts:     Right: Normal. No mass.     Left: Normal. No mass.    Abdominal:     General: Bowel sounds are normal.     Palpations: Abdomen is soft. There is no hepatomegaly, splenomegaly or mass.     Tenderness: There is no abdominal tenderness. There is no guarding or rebound.  Musculoskeletal:     Comments: 5/5 strength in upper and lower extremities   Skin:    General: Skin is warm and dry.   Neurological:     Mental Status: She is alert and oriented to person, place, and time.     Deep Tendon Reflexes: Reflexes are normal and symmetric.     Reflex Scores:      Patellar reflexes are 2+ on the right side and 2+ on the left side. Psychiatric:        Behavior: Behavior normal.     BP (!) 136/59 (BP Location: Right Arm, Patient Position: Sitting, Cuff Size: Large)   Pulse 65   Temp 98.5 F (36.9 C) (Oral)   Resp 16   Ht _0  (1.702 m)   Wt 230 lb (104.3 kg)   LMP 02/28/2007   SpO2 100%   BMI 36.02 kg/m  Wt Readings from Last 3 Encounters:  11/12/20 230 lb (104.3 kg)  07/23/20 227 lb (103 kg)  04/21/20 227 lb (103 kg)    Diabetic Foot Exam - Simple   No data filed    Lab Results  Component Value Date   WBC 5.2 09/16/2018   HGB 13.2 09/16/2018   HCT 38.7 09/16/2018   PLT 166.0 09/16/2018   GLUCOSE 121 (H) 07/23/2020   CHOL 151 04/21/2020   TRIG 77 04/21/2020   HDL 66 04/21/2020   LDLCALC 69 04/21/2020   ALT 15 03/18/2019   AST 17 03/18/2019   NA 143 07/23/2020   K 4.0 07/23/2020   CL 106 07/23/2020   CREATININE 0.86 07/23/2020   BUN 19 07/23/2020   CO2 32 07/23/2020   TSH 0.45 09/16/2018   HGBA1C 6.8 (H) 07/23/2020   MICROALBUR 2.2 (H) 03/18/2019  Lab Results  Component Value Date   TSH 0.45 09/16/2018   Lab Results  Component Value Date   WBC 5.2 09/16/2018   HGB 13.2 09/16/2018   HCT 38.7 09/16/2018   MCV 74.0 (L) 09/16/2018   PLT 166.0 09/16/2018   Lab Results  Component Value Date   NA 143 07/23/2020   K 4.0 07/23/2020   CO2 32 07/23/2020   GLUCOSE 121 (H) 07/23/2020   BUN 19 07/23/2020   CREATININE 0.86 07/23/2020   BILITOT 0.8 03/18/2019   ALKPHOS 62 03/18/2019   AST 17 03/18/2019   ALT 15 03/18/2019   PROT 7.1 03/18/2019   ALBUMIN 4.3 03/18/2019   CALCIUM 9.6 07/23/2020   ANIONGAP 12 03/30/2014   GFR 71.74 07/23/2020   Lab Results  Component Value Date   CHOL 151 04/21/2020   Lab Results  Component Value Date    HDL 66 04/21/2020   Lab Results  Component Value Date   LDLCALC 69 04/21/2020   Lab Results  Component Value Date   TRIG 77 04/21/2020   Lab Results  Component Value Date   CHOLHDL 2.3 04/21/2020   Lab Results  Component Value Date   HGBA1C 6.8 (H) 07/23/2020      Assessment & Plan:   Problem List Items Addressed This Visit      Endocrine   Type 2 diabetes mellitus, controlled, with renal complications ([place]) - Primary   Relevant Medications   lisinopril (ZESTRIL) 2.5 MG tablet   Other Relevant Orders   Hemoglobin A1c   Comp Met (CMET)     Other   Seasonal allergies    Uncontrolled. Continue zyrtec 73m once daily. Add singulair 164monce daily. Call if symptoms worsen or fail to improve.          Meds ordered this encounter  Medications  . montelukast (SINGULAIR) 10 MG tablet    Sig: Take 1 tablet (10 mg total) by mouth at bedtime.    Dispense:  30 tablet    Refill:  3    Order Specific Question:   Supervising Provider    Answer:   BLPenni Homans [[id number]]. amLODipine (NORVASC) 10 MG tablet    Sig: Take 1 tablet (10 mg total) by mouth daily.    Dispense:  90 tablet    Refill:  0    Order Specific Question:   Supervising Provider    Answer:   BLPenni Homans [[id number]]. lisinopril (ZESTRIL) 2.5 MG tablet    Sig: Take 1 tablet (2.5 mg total) by mouth daily.    Dispense:  90 tablet    Refill:  1    Order Specific Question:   Supervising Provider    Answer:   BLMosie [id number]]    I, AlLucille Passypersonally preformed the services described in this documentation.  All medical record entries made by the scribe were at my direction and in my presence.  I have reviewed the chart and discharge instructions (if applicable) and agree that the record reflects my personal performance and is accurate and complete. 11/12/2020  I,Alexis Bryant,acting as a scribe for MeNance PearNP.,have documented all relevant documentation on the behalf of MeNance PearNP,as directed by  MeNance PearNP while in the presence of MeNance PearNP.  AlLucille Passy

## 2020-11-12 NOTE — Assessment & Plan Note (Signed)
Uncontrolled. Continue zyrtec 10mg  once daily. Add singulair 10mg  once daily. Call if symptoms worsen or fail to improve.

## 2020-11-15 ENCOUNTER — Other Ambulatory Visit: Payer: Self-pay

## 2020-11-15 MED ORDER — AMLODIPINE BESYLATE 10 MG PO TABS: 10.0000 mg | ORAL_TABLET | Freq: Every day | ORAL | 1 refills | Status: DC

## 2020-11-15 NOTE — Telephone Encounter (Signed)
Called [facility] for records but they can not go back over 2 years due to new system. Patient confirms she had both, we have one listed for 2019.   Patient wanted provider to know her cough is not better and will like something stronger for this.

## 2020-11-16 NOTE — Telephone Encounter (Signed)
Records located on [facility] and updated in patient's chart

## 2020-11-18 ENCOUNTER — Ambulatory Visit (INDEPENDENT_AMBULATORY_CARE_PROVIDER_SITE_OTHER): Admitting: Nurse Practitioner

## 2020-11-18 ENCOUNTER — Encounter: Payer: Self-pay | Admitting: Nurse Practitioner

## 2020-11-18 ENCOUNTER — Other Ambulatory Visit: Payer: Self-pay

## 2020-11-18 VITALS — BP 124/84

## 2020-11-18 DIAGNOSIS — L729 Follicular cyst of the skin and subcutaneous tissue, unspecified: Secondary | ICD-10-CM | POA: Diagnosis not present

## 2020-11-18 DIAGNOSIS — R6889 Other general symptoms and signs: Secondary | ICD-10-CM | POA: Diagnosis not present

## 2020-11-18 MED ORDER — SULFAMETHOXAZOLE-TRIMETHOPRIM 800-160 MG PO TABS: 1.0000 | ORAL_TABLET | Freq: Two times a day (BID) | ORAL | 0 refills | Status: AC

## 2020-11-18 NOTE — Progress Notes (Signed)
   Acute Office Visit  Subjective:    Patient ID: Nancy Deleon, female    DOB: [birth year], 65 y.o.   MRN: [id number]   HPI 65 y.o. presents today for large bump on left buttock that she noticed 3 days ago while showering. It is non-tender, has not changed in size, and has no drainage.   Review of Systems  Constitutional: Negative.   Skin: Positive for wound (to left buttock).       Objective:    Physical Exam Constitutional:      Appearance: Normal appearance.  Genitourinary:      BP 124/84   LMP 02/28/2007  Wt Readings from Last 3 Encounters:  11/12/20 230 lb (104.3 kg)  07/23/20 227 lb (103 kg)  04/21/20 227 lb (103 kg)        Assessment & Plan:   Problem List Items Addressed This Visit   None   Visit Diagnoses    Cyst of buttocks    -  Primary   Suspected soft tissue infection       Relevant Medications   sulfamethoxazole-trimethoprim (BACTRIM DS) 800-160 MG tablet      Plan: Area appears non-infectious at this time. Due to size, location, and depth we will avoid excision unless she begins to show signs of infection. Recommend warm compresses and avoiding friction to the area. Bactrim 800-160 mg twice daily x 14 days. She has an annual appointment in 2 weeks and we will reassess then. She is aware to return if she begins to have signs of infection such as redness, drainage, swelling, or fever. She is agreeable to plan.     Tamela Gammon DNP, 3:31 PM 11/18/2020

## 2020-12-01 ENCOUNTER — Encounter: Payer: Self-pay | Admitting: Nurse Practitioner

## 2020-12-01 ENCOUNTER — Ambulatory Visit (INDEPENDENT_AMBULATORY_CARE_PROVIDER_SITE_OTHER): Admitting: Nurse Practitioner

## 2020-12-01 ENCOUNTER — Other Ambulatory Visit: Payer: Self-pay

## 2020-12-01 VITALS — BP 124/74 | Ht 67.0 in | Wt 230.0 lb

## 2020-12-01 DIAGNOSIS — Z01419 Encounter for gynecological examination (general) (routine) without abnormal findings: Secondary | ICD-10-CM | POA: Diagnosis not present

## 2020-12-01 DIAGNOSIS — Z780 Asymptomatic menopausal state: Secondary | ICD-10-CM

## 2020-12-01 NOTE — Progress Notes (Signed)
   Nancy Deleon [birth year] [id number]   History:  65 y.o. G1P1 presents for annual exam without GYN complaints. Postmenopausal - no HRT, no bleeding. Normal pap history. 2018 left breast lumpectomy with radioactive seed placement. DM managed by PCP. Was seen 11/18/2020 for soft tissue infection with much improvement since starting antibiotic.   Gynecologic History Patient's last menstrual period was 02/28/2007.   Contraception/Family planning: post menopausal status  Health Maintenance Last Pap: 10/2018. Results were: normal Last mammogram: 05/28/2020. Results were: normal Last colonoscopy: 12/2019. Results were: normal, 10-year recall Last Dexa: 2020. Results were: normal, 5-year recall  Past medical history, past surgical history, family history and social history were all reviewed and documented in the EPIC chart. Widowed. Son getting married 12/2020.   ROS:  A ROS was performed and pertinent positives and negatives are included.  Exam:  Vitals:   12/01/20 1348  BP: 124/74  Weight: 230 lb (104.3 kg)  Height: 5\' 7"  (1.702 m)   Body mass index is 36.02 kg/m.  General appearance:  Normal Thyroid:  Symmetrical, normal in size, without palpable masses or nodularity. Respiratory  Auscultation:  Clear without wheezing or rhonchi Cardiovascular  Auscultation:  Regular rate, without rubs, murmurs or gallops  Edema/varicosities:  Not grossly evident Abdominal  Soft,nontender, without masses, guarding or rebound.  Liver/spleen:  No organomegaly noted  Hernia:  None appreciated  Skin  Inspection:  Grossly normal Breasts: Examined lying and sitting.   Right: Without masses, retractions, nipple discharge or axillary adenopathy.   Left: Without masses, retractions, nipple discharge or axillary adenopathy. Gentitourinary   Inguinal/mons:  Normal without inguinal adenopathy  External genitalia:  Normal appearing vulva with no masses, tenderness, or  lesions  BUS/Urethra/Skene's glands:  Normal  Vagina:  Normal appearing with normal color and discharge, no lesions  Cervix:  Normal appearing without discharge or lesions  Uterus:  Normal in size, shape and contour.  Midline and mobile, nontender  Adnexa/parametria:     Rt: Normal in size, without masses or tenderness.   Lt: Normal in size, without masses or tenderness.  Anus and perineum: Much improvement in cyst-like structure on left lower buttock/perineal area about 1 cm x 1 cm (compared to 3 cm x 5 cm 11/18/2020)  Digital rectal exam: Normal sphincter tone without palpated masses or tenderness  Assessment/Plan:  65 y.o. G1P1 for annual exam.   Well female exam with routine gynecological exam - Education provided on SBEs, importance of preventative screenings, current guidelines, high calcium diet, regular exercise, and multivitamin daily. Labs with PCP.   Postmenopausal - no HRT, no bleeding.  Screening for cervical cancer - Normal Pap history.  Will repeat at 5-year interval per guidelines and if normal consider stopping screenings per guidelines.   Screening for breast cancer - Normal mammogram history.  Continue annual screenings.  Normal breast exam today.  Screening for colon cancer - 12/2019 normal colonoscopy. Will repeat at GI's recommended interval.   Screening for osteoporosis - normal DXA 2020. Will repeat at 5-year interval per recommendation.   Return in 1 year for annual.    Tamela Gammon DNP, 1:57 PM 12/01/2020

## 2020-12-01 NOTE — Patient Instructions (Signed)

## 2020-12-08 ENCOUNTER — Encounter: Payer: Self-pay | Admitting: Family

## 2020-12-08 MED ORDER — PROMETHAZINE-DM 6.25-15 MG/5ML PO SYRP: 5.0000 mL | ORAL_SOLUTION | Freq: Four times a day (QID) | ORAL | 0 refills | Status: DC | PRN

## 2020-12-24 ENCOUNTER — Ambulatory Visit: Attending: Internal Medicine

## 2020-12-24 ENCOUNTER — Encounter: Payer: Self-pay | Admitting: Family

## 2020-12-24 ENCOUNTER — Ambulatory Visit (INDEPENDENT_AMBULATORY_CARE_PROVIDER_SITE_OTHER): Admitting: Family

## 2020-12-24 ENCOUNTER — Other Ambulatory Visit: Payer: Self-pay

## 2020-12-24 VITALS — BP 133/43 | HR 100 | Temp 98.6°F | Resp 16 | Wt 229.0 lb

## 2020-12-24 DIAGNOSIS — R809 Proteinuria, unspecified: Secondary | ICD-10-CM | POA: Diagnosis not present

## 2020-12-24 DIAGNOSIS — E1129 Type 2 diabetes mellitus with other diabetic kidney complication: Secondary | ICD-10-CM | POA: Diagnosis not present

## 2020-12-24 DIAGNOSIS — J302 Other seasonal allergic rhinitis: Secondary | ICD-10-CM

## 2020-12-24 DIAGNOSIS — I10 Essential (primary) hypertension: Secondary | ICD-10-CM | POA: Diagnosis not present

## 2020-12-24 DIAGNOSIS — Z23 Encounter for immunization: Secondary | ICD-10-CM

## 2020-12-24 LAB — MICROALBUMIN / CREATININE URINE RATIO
Creatinine,U: 217 mg/dL
Microalb Creat Ratio: 1.6 mg/g (ref 0.0–30.0)
Microalb, Ur: 3.6 mg/dL — ABNORMAL HIGH (ref 0.0–1.9)

## 2020-12-24 MED ORDER — BETAMETHASONE DIPROPIONATE 0.05 % EX CREA: TOPICAL_CREAM | Freq: Two times a day (BID) | CUTANEOUS | 0 refills | Status: DC | PRN

## 2020-12-24 NOTE — Assessment & Plan Note (Addendum)
At goal. Continue diabetic diet.  Needs Urine microalbumin. Also, she is advised to get her covid shot #4 today.  Recommended that she follow through with her referral for eye exam and continue statin for CV risk reduction.

## 2020-12-24 NOTE — Assessment & Plan Note (Signed)
Stable on otc antihistamine. No longer using singulair.

## 2020-12-24 NOTE — Patient Instructions (Signed)
Please complete lab work prior to leaving.  Apply betamethasone cream twice daily as needed to rash on cheeks- avoid getting cream in your eyes.

## 2020-12-24 NOTE — Progress Notes (Signed)
Subjective:   By signing my name below, I, [name], attest that this documentation has been prepared under the direction and in the presence of [name] NP. 12/24/2020      Patient ID: Nancy Deleon, female    DOB: 04/09/1956, 65 y.o.   MRN: [id number]  No chief complaint on file.   HPI Patient is in today for a office visit. She complains of a rash on the right side of her face since her last visit. She is requesting for a refill a cream recommenced during her last visit. She denies having any leg swelling. During her last visit she had a reaction to lisinopril that caused her a dry cough. Since she stopped taking it hey dry cough has improved. She has stopped taking cough syrup to manage her cough. She is interested in getting the 4th Covid-19 vaccine at this time.  Allergy- She does not use 10 mg Singulair daily PO and instead uses OTC medication to manage her allergies. Hypertension- She is doing well while taking 5 mg amlodipine to manage her hypertension. Diabetes- She is doing well while taking 10 mg Lipitor daily PO to manage her symptoms. Vision- She has an upcoming vision care appointment.    Past Medical History:  Diagnosis Date  . Allergy   . Fatty liver 04/03/2014  . GERD (gastroesophageal reflux disease)   . Hypertension   . 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 Multiple thyroid nodules 08/20/2017  . Obesity   . Type 2 diabetes mellitus, controlled, with renal complications ([facility]) 5/0/3888   12/18/19-pt states she does not have diabetes, just checks blood sugar as needed.    Past Surgical History:  Procedure Laterality Date  . BIOPSY THYROID    . BREAST BIOPSY  04/24/11   [facility]  . BREAST EXCISIONAL BIOPSY    . BREAST LUMPECTOMY WITH RADIOACTIVE SEED LOCALIZATION Left 05/01/2017   Procedure: LEFT BREAST LUMPECTOMY WITH RADIOACTIVE SEED LOCALIZATION;  Surgeon: Donnie Mesa, MD;  Location: [facility];  Service:  General;  Laterality: Left;  . CESAREAN SECTION  [id number]  . COLONOSCOPY  2011   [facility]-  normal  . toenail removal  09/2020   Dr. Cannon Kettle  . TUBAL LIGATION    . UPPER GASTROINTESTINAL ENDOSCOPY      Family History  Problem Relation Age of Onset  . Lymphoma Mother   . Dementia Mother   . Cancer Father        lung  . Colon cancer Maternal Uncle   . Esophageal cancer Neg Hx   . Pancreatic cancer Neg Hx   . Rectal cancer Neg Hx   . Stomach cancer Neg Hx   . Thyroid disease Neg Hx   . Colon polyps Neg Hx     Social History   Socioeconomic History  . Marital status: Widowed    Spouse name: Not on file  . Number of children: 2  . Years of education: Not on file  . Highest education level: Not on file  Occupational History    Employer: [employer]  Tobacco Use  . Smoking status: Never Smoker  . Smokeless tobacco: Never Used  Vaping Use  . Vaping Use: Never used  Substance and Sexual Activity  . Alcohol use: Yes    Alcohol/week: 0.0 standard drinks    Comment: occasional wine  . Drug use: No  . Sexual activity: Yes    Partners: Male    Birth control/protection: Post-menopausal  Comment: 1st intercourse 65 yo-Fewer than 5 partners  Other Topics Concern  . Not on file  Social History Narrative   Regular exercise:  Walks 2-5 x weekly   Caffeine Use: no   Widowed   She has a son [name]- he is 32 yrs old.   Foster parent.   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   Dog (Lab named Abby)         Social Determinants of Health   Financial Resource Strain: Not on file  Food Insecurity: Not on file  Transportation Needs: Not on file  Physical Activity: Not on file  Stress: Not on file  Social Connections: Not on file  Intimate Partner Violence: Not on file    Outpatient Medications Prior to Visit  Medication Sig Dispense Refill  . acetaminophen (TYLENOL) 500 MG tablet Take 1,000 mg by mouth  every 6 (six) hours as needed.    Marland Kitchen amLODipine (NORVASC) 10 MG tablet Take 1 tablet (10 mg total) by mouth daily. 90 tablet 1  . atorvastatin (LIPITOR) 10 MG tablet Take 1 tablet (10 mg total) by mouth daily. 90 tablet 1  . blood glucose meter kit and supplies KIT Dispense based on patient and insurance preference. Check sugar 1-2 times daily.  (FOR ICD-9 250.00, 250.) 1 each 0  . calcium-vitamin D (OSCAL WITH D) 500-200 MG-UNIT tablet Take 1 tablet by mouth.    . montelukast (SINGULAIR) 10 MG tablet Take 1 tablet (10 mg total) by mouth at bedtime. 30 tablet 3  . Multiple Vitamins-Minerals (CENTRUM ULTRA WOMENS) TABS Take 1 tablet by mouth daily.    . promethazine-dextromethorphan (PROMETHAZINE-DM) 6.25-15 MG/5ML syrup Take 5 mLs by mouth 4 (four) times daily as needed for cough. 200 mL 0   Facility-Administered Medications Prior to Visit  Medication Dose Route Frequency Provider Last Rate Last Admin  . 0.9 %  sodium chloride infusion  500 mL Intravenous Once Ladene Artist, MD        Allergies  Allergen Reactions  . Naproxen Nausea And Vomiting    REACTION: vomiting  . Ropinirole Nausea Only  . Vicodin [Hydrocodone-Acetaminophen] Nausea And Vomiting  . Propoxyphene N-Acetaminophen Nausea And Vomiting    Review of Systems  Cardiovascular: Negative for leg swelling.  Skin: Positive for rash (face).       Objective:    Physical Exam Constitutional:      Appearance: Normal appearance.  HENT:     Head: Normocephalic and atraumatic.     Right Ear: External ear normal.     Left Ear: External ear normal.  Eyes:     Extraocular Movements: Extraocular movements intact.     Pupils: Pupils are equal, round, and reactive to light.  Cardiovascular:     Rate and Rhythm: Normal rate and regular rhythm.     Pulses: Normal pulses.     Heart sounds: Normal heart sounds. No murmur heard. No gallop.   Pulmonary:     Effort: Pulmonary effort is normal. No respiratory distress.     Breath  sounds: Normal breath sounds. No wheezing, rhonchi or rales.  Skin:    General: Skin is warm and dry.     Comments: Fine macular rash on left cheek.  Neurological:     Mental Status: She is alert and oriented to person, place, and time.  Psychiatric:        Behavior: Behavior normal.  LMP 02/28/2007  Wt Readings from Last 3 Encounters:  12/01/20 230 lb (104.3 kg)  11/12/20 230 lb (104.3 kg)  07/23/20 227 lb (103 kg)    Diabetic Foot Exam - Simple   No data filed    Lab Results  Component Value Date   WBC 5.2 09/16/2018   HGB 13.2 09/16/2018   HCT 38.7 09/16/2018   PLT 166.0 09/16/2018   GLUCOSE 154 (H) 11/12/2020   CHOL 151 04/21/2020   TRIG 77 04/21/2020   HDL 66 04/21/2020   LDLCALC 69 04/21/2020   ALT 16 11/12/2020   AST 15 11/12/2020   NA 140 11/12/2020   K 3.9 11/12/2020   CL 104 11/12/2020   CREATININE 0.85 11/12/2020   BUN 9 11/12/2020   CO2 31 11/12/2020   TSH 0.45 09/16/2018   HGBA1C 6.8 (H) 11/12/2020   MICROALBUR 2.2 (H) 03/18/2019    Lab Results  Component Value Date   TSH 0.45 09/16/2018   Lab Results  Component Value Date   WBC 5.2 09/16/2018   HGB 13.2 09/16/2018   HCT 38.7 09/16/2018   MCV 74.0 (L) 09/16/2018   PLT 166.0 09/16/2018   Lab Results  Component Value Date   NA 140 11/12/2020   K 3.9 11/12/2020   CO2 31 11/12/2020   GLUCOSE 154 (H) 11/12/2020   BUN 9 11/12/2020   CREATININE 0.85 11/12/2020   BILITOT 0.5 11/12/2020   ALKPHOS 76 11/12/2020   AST 15 11/12/2020   ALT 16 11/12/2020   PROT 7.0 11/12/2020   ALBUMIN 4.2 11/12/2020   CALCIUM 9.9 11/12/2020   ANIONGAP 12 03/30/2014   GFR 72.60 11/12/2020   Lab Results  Component Value Date   CHOL 151 04/21/2020   Lab Results  Component Value Date   HDL 66 04/21/2020   Lab Results  Component Value Date   LDLCALC 69 04/21/2020   Lab Results  Component Value Date   TRIG 77 04/21/2020   Lab Results  Component Value Date   CHOLHDL 2.3 04/21/2020   Lab  Results  Component Value Date   HGBA1C 6.8 (H) 11/12/2020       Assessment & Plan:   Problem List Items Addressed This Visit   None      No orders of the defined types were placed in this encounter.   I, [name] NP, personally preformed the services described in this documentation.  All medical record entries made by the scribe were at my direction and in my presence.  I have reviewed the chart and discharge instructions (if applicable) and agree that the record reflects my personal performance and is accurate and complete. 12/24/2020   I,[name],acting as a Education administrator for Nance Pear, NP.,have documented all relevant documentation on the behalf of Nance Pear, NP,as directed by  Nance Pear, NP while in the presence of Nance Pear, NP.   Shehryar [employer]

## 2020-12-24 NOTE — Assessment & Plan Note (Signed)
She is currently maintained on amlodipine 5mg  once daily.  BP is stable. Continue same.

## 2020-12-24 NOTE — Progress Notes (Signed)
   Covid-19 Vaccination Clinic  Name:  Nancy Deleon    MRN: [id number] DOB: [birth year]  12/24/2020  [name] was observed post Covid-19 immunization for 15 minutes without incident. She was provided with Vaccine Information Sheet and instruction to access the V-Safe system.   [name] was instructed to call 911 with any severe reactions post vaccine: Marland Kitchen Difficulty breathing  . Swelling of face and throat  . A fast heartbeat  . A bad rash all over body  . Dizziness and weakness   Immunizations Administered    Name Date Dose VIS Date Route   PFIZER Comrnaty(Gray TOP) Covid-19 Vaccine 12/24/2020  9:00 AM 0.3 mL 07/15/2020 Intramuscular   Manufacturer: [place]   Lot: [id number]   NDC: [phone]

## 2020-12-25 MED ORDER — LOSARTAN POTASSIUM 25 MG PO TABS: 12.5000 mg | ORAL_TABLET | Freq: Every day | ORAL | 0 refills | Status: DC

## 2020-12-25 NOTE — Addendum Note (Signed)
Addended by: [name] on: 12/25/2020 03:29 PM   Modules accepted: Orders

## 2020-12-26 MED ORDER — LOSARTAN POTASSIUM 25 MG PO TABS: 12.5000 mg | ORAL_TABLET | Freq: Every day | ORAL | 0 refills | Status: DC

## 2020-12-26 NOTE — Addendum Note (Signed)
Addended by: [name] on: 12/26/2020 06:52 PM   Modules accepted: Orders

## 2020-12-26 NOTE — Telephone Encounter (Signed)
Please cancel losartan rx at [facility].

## 2020-12-27 ENCOUNTER — Other Ambulatory Visit (HOSPITAL_BASED_OUTPATIENT_CLINIC_OR_DEPARTMENT_OTHER): Payer: Self-pay

## 2020-12-27 MED ORDER — PFIZER-BIONT COVID-19 VAC-TRIS 30 MCG/0.3ML IM SUSP
INTRAMUSCULAR | 0 refills | Status: DC
  Filled 2020-12-27: qty 0.3, 1d supply, fill #0

## 2021-02-23 LAB — HM DIABETES EYE EXAM

## 2021-03-07 ENCOUNTER — Ambulatory Visit: Admitting: Family

## 2021-03-15 ENCOUNTER — Ambulatory Visit: Admitting: Family

## 2021-03-23 ENCOUNTER — Telehealth: Payer: Self-pay | Admitting: Family

## 2021-03-23 ENCOUNTER — Other Ambulatory Visit: Payer: Self-pay

## 2021-03-23 ENCOUNTER — Ambulatory Visit (INDEPENDENT_AMBULATORY_CARE_PROVIDER_SITE_OTHER): Admitting: Family

## 2021-03-23 VITALS — BP 138/63 | HR 71 | Temp 99.4°F | Resp 16 | Ht 67.0 in | Wt 228.0 lb

## 2021-03-23 DIAGNOSIS — R21 Rash and other nonspecific skin eruption: Secondary | ICD-10-CM | POA: Diagnosis not present

## 2021-03-23 DIAGNOSIS — I10 Essential (primary) hypertension: Secondary | ICD-10-CM

## 2021-03-23 DIAGNOSIS — E1129 Type 2 diabetes mellitus with other diabetic kidney complication: Secondary | ICD-10-CM | POA: Diagnosis not present

## 2021-03-23 DIAGNOSIS — R809 Proteinuria, unspecified: Secondary | ICD-10-CM | POA: Diagnosis not present

## 2021-03-23 DIAGNOSIS — E785 Hyperlipidemia, unspecified: Secondary | ICD-10-CM | POA: Diagnosis not present

## 2021-03-23 LAB — LIPID PANEL
Cholesterol: 133 mg/dL (ref 0–200)
HDL: 54.3 mg/dL (ref >39.00)
LDL Cholesterol: 65 mg/dL (ref 0–99)
NonHDL: 78.97
Total CHOL/HDL Ratio: 2
Triglycerides: 70 mg/dL (ref 0.0–149.0)
VLDL: 14 mg/dL (ref 0.0–40.0)

## 2021-03-23 LAB — BASIC METABOLIC PANEL
BUN: 14 mg/dL (ref 6–23)
CO2: 29 mEq/L (ref 19–32)
Calcium: 9.8 mg/dL (ref 8.4–10.5)
Chloride: 104 mEq/L (ref 96–112)
Creatinine, Ser: 0.87 mg/dL (ref 0.40–1.20)
GFR: 70.43 mL/min (ref >60.00)
Glucose, Bld: 164 mg/dL — ABNORMAL HIGH (ref 70–99)
Potassium: 3.8 mEq/L (ref 3.5–5.1)
Sodium: 141 mEq/L (ref 135–145)

## 2021-03-23 LAB — HEMOGLOBIN A1C: Hgb A1c MFr Bld: 7.5 % — ABNORMAL HIGH (ref 4.6–6.5)

## 2021-03-23 MED ORDER — AMLODIPINE BESYLATE 10 MG PO TABS: 10.0000 mg | ORAL_TABLET | Freq: Every day | ORAL | 1 refills | Status: DC

## 2021-03-23 MED ORDER — ATORVASTATIN CALCIUM 10 MG PO TABS: 10.0000 mg | ORAL_TABLET | Freq: Every day | ORAL | 1 refills | Status: DC

## 2021-03-23 NOTE — Assessment & Plan Note (Signed)
BP is at goal. Continue amlodipine 10 mg.

## 2021-03-23 NOTE — Assessment & Plan Note (Signed)
She attributes the rash on her cheeks to irritation from wearing a mask. Monitor.

## 2021-03-23 NOTE — Telephone Encounter (Signed)
See mychart.  

## 2021-03-23 NOTE — Progress Notes (Signed)
 Subjective:   By signing my name below, I, Nancy Deleon, attest that this documentation has been prepared under the direction and in the presence of Nancy Deleon. 03/23/2021    Patient ID: Nancy Deleon, female    DOB: 09/28/1955, 65 y.o.   MRN: [id number]  Chief Complaint  Patient presents with   Hypertension    Here for follow up   Diabetes    Here for follow up   Rash    Facial rash, still itching when wearing mask for long time    HPI Patient is in today for a office visit.   Diet- She is managing a consistent healthy diet and plans on improving it. Blood sugar- She is interested in getting her a1c levels checked during this visit.   Lab Results  Component Value Date   HGBA1C 6.8 (H) 11/12/2020   Blood pressure- She continues taking 10 mg amlodipine daily PO and reports no new issues while taking it.  BP Readings from Last 3 Encounters:  03/23/21 138/63  12/24/20 (!) 133/43  12/01/20 124/74   Cholesterol- She continues taking 10 mg Lipitor daily PO and reports no new issues while taking it.   Lab Results  Component Value Date   CHOL 151 04/21/2020   HDL 66 04/21/2020   LDLCALC 69 04/21/2020   TRIG 77 04/21/2020   CHOLHDL 2.3 04/21/2020   Face cream- She continues applying 0.05% betamethasone dipropionate cream 2x daily PO for eczema on her face and reports mild improvement. She notes that her face masks causes her to break out in acne frequently.    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    Past Surgical History:  Procedure Laterality Date   BIOPSY THYROID     BREAST BIOPSY  04/24/11   [facility] breast   BREAST EXCISIONAL BIOPSY     BREAST LUMPECTOMY WITH RADIOACTIVE SEED LOCALIZATION Left 05/01/2017   Procedure: LEFT BREAST LUMPECTOMY WITH RADIOACTIVE SEED LOCALIZATION;  Surgeon: Tsuei, Matthew, MD;  Location: [facility];  Service: General;  Laterality: Left;   CESAREAN SECTION  [id number]   COLONOSCOPY  2011   [facility]-  normal   toenail removal  09/2020   Dr. Stover   TUBAL LIGATION     UPPER GASTROINTESTINAL ENDOSCOPY      Family History  Problem Relation Age of Onset   Lymphoma Mother    Dementia Mother    Cancer Father        lung   Colon cancer Maternal Uncle    Esophageal cancer Neg Hx    Pancreatic cancer Neg Hx    Rectal cancer Neg Hx    Stomach cancer Neg Hx    Thyroid disease Neg Hx    Colon polyps Neg Hx     Social History   Socioeconomic History   Marital status: Widowed    Spouse name: Not on file   Number of children: 2   Years of   education: Not on file   Highest education level: Not on file  Occupational History    Employer: [employer]  Tobacco Use   Smoking status: Never   Smokeless tobacco: Never  Vaping Use   Vaping Use: Never used  Substance and Sexual Activity   Alcohol use: Yes    Alcohol/week: 0.0 standard drinks    Comment: occasional wine   Drug use: No   Sexual activity: Yes    Partners: Male    Birth control/protection: Post-menopausal    Comment: 1st intercourse 65 yo-Fewer than 5 partners  Other Topics Concern   Not on file  Social History Narrative   Regular exercise:  Walks 2-5 x weekly   Caffeine Use: no   Widowed   She has a son Nancy Deleon- he is 88 yrs old.   Foster parent.   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   Dog (Lab named Abby)         Social Determinants of Health   Financial Resource  Strain: Not on file  Food Insecurity: Not on file  Transportation Needs: Not on file  Physical Activity: Not on file  Stress: Not on file  Social Connections: Not on file  Intimate Partner Violence: Not on file    Outpatient Medications Prior to Visit  Medication Sig Dispense Refill   acetaminophen (TYLENOL) 500 MG tablet Take 1,000 mg by mouth every 6 (six) hours as needed.     betamethasone dipropionate 0.05 % cream Apply topically 2 (two) times daily as needed. 30 g 0   blood glucose meter kit and supplies KIT Dispense based on patient and insurance preference. Check sugar 1-2 times daily.  (FOR ICD-9 250.00, 250.) 1 each 0   calcium-vitamin D (OSCAL WITH D) 500-200 MG-UNIT tablet Take 1 tablet by mouth.     COVID-19 mRNA Vac-TriS, Pfizer, (PFIZER-BIONT COVID-19 VAC-TRIS) SUSP injection Inject into the muscle. 0.3 mL 0   Multiple Vitamins-Minerals (CENTRUM ULTRA WOMENS) TABS Take 1 tablet by mouth daily.     amLODipine (NORVASC) 10 MG tablet Take 1 tablet (10 mg total) by mouth daily. 90 tablet 1   atorvastatin (LIPITOR) 10 MG tablet Take 1 tablet (10 mg total) by mouth daily. 90 tablet 1   losartan (COZAAR) 25 MG tablet Take 0.5 tablets (12.5 mg total) by mouth daily. 45 tablet 0   Facility-Administered Medications Prior to Visit  Medication Dose Route Frequency Provider Last Rate Last Admin   0.9 %  sodium chloride infusion  500 mL Intravenous Once Ladene Artist, MD        Allergies  Allergen Reactions   Naproxen Nausea And Vomiting    REACTION: vomiting   Ropinirole Nausea Only   Vicodin [Hydrocodone-Acetaminophen] Nausea And Vomiting   Propoxyphene N-Acetaminophen Nausea And Vomiting    ROS     Objective:    Physical Exam Constitutional:      General: She is not in acute distress.    Appearance: Normal appearance. She is not ill-appearing.  HENT:     Head: Normocephalic and atraumatic.     Right Ear: External ear normal.     Left Ear: External ear normal.   Eyes:     Extraocular Movements: Extraocular movements intact.     Pupils: Pupils are equal, round, and reactive to light.  Cardiovascular:     Rate and Rhythm: Normal rate and regular rhythm.  Heart sounds: Normal heart sounds. No murmur heard.   No gallop.  Pulmonary:     Effort: Pulmonary effort is normal. No respiratory distress.     Breath sounds: Normal breath sounds. No wheezing or rales.  Skin:    General: Skin is warm and dry.  Neurological:     Mental Status: She is alert and oriented to person, place, and time.  Psychiatric:        Behavior: Behavior normal.    BP 138/63 (BP Location: Right Arm, Patient Position: Sitting, Cuff Size: Large)   Pulse 71   Temp 99.4 F (37.4 C) (Oral)   Resp 16   Ht 5' 7" (1.702 m)   Wt 228 lb (103.4 kg)   LMP 02/28/2007   SpO2 97%   BMI 35.71 kg/m  Wt Readings from Last 3 Encounters:  03/23/21 228 lb (103.4 kg)  12/24/20 229 lb (103.9 kg)  12/01/20 230 lb (104.3 kg)       Assessment & Plan:   Problem List Items Addressed This Visit       Unprioritized   Type 2 diabetes mellitus, controlled, with renal complications (HCC) - Primary    Last A1C was at goal. Clinically stable on diabetic diet. Will obtain follow up A1C.       Relevant Medications   atorvastatin (LIPITOR) 10 MG tablet   Other Relevant Orders   Hemoglobin A1c   Skin rash    She attributes the rash on her cheeks to irritation from wearing a mask. Monitor.       Hyperlipidemia    Tolerating statin. Obtain lipid panel, continue atorvastatin 10mg.       Relevant Medications   amLODipine (NORVASC) 10 MG tablet   atorvastatin (LIPITOR) 10 MG tablet   Other Relevant Orders   Lipid panel   Essential hypertension    BP is at goal. Continue amlodipine 10 mg.       Relevant Medications   amLODipine (NORVASC) 10 MG tablet   atorvastatin (LIPITOR) 10 MG tablet   Other Relevant Orders   Basic metabolic panel     Meds ordered this encounter   Medications   amLODipine (NORVASC) 10 MG tablet    Sig: Take 1 tablet (10 mg total) by mouth daily.    Dispense:  90 tablet    Refill:  1    Order Specific Question:   Supervising Provider    Answer:   BLYTH, STACEY A [[id number]]   atorvastatin (LIPITOR) 10 MG tablet    Sig: Take 1 tablet (10 mg total) by mouth daily.    Dispense:  90 tablet    Refill:  1    Order Specific Question:   Supervising Provider    Answer:   BLYTH, STACEY A [[id number]]    I, Nancy Deleon, personally preformed the services described in this documentation.  All medical record entries made by the scribe were at my direction and in my presence.  I have reviewed the chart and discharge instructions (if applicable) and agree that the record reflects my personal performance and is accurate and complete. 03/23/2021   I,Nancy Deleon,acting as a scribe for Nancy S O'Sullivan, Deleon.,have documented all relevant documentation on the behalf of Nancy S O'Sullivan, Deleon,as directed by  Nancy S O'Sullivan, Deleon while in the presence of Nancy S O'Sullivan, Deleon.   Nancy S O'Sullivan, Deleon  

## 2021-03-23 NOTE — Assessment & Plan Note (Signed)
Last A1C was at goal. Clinically stable on diabetic diet. Will obtain follow up A1C.

## 2021-03-23 NOTE — Patient Instructions (Signed)
Please complete lab work prior to leaving.   

## 2021-03-23 NOTE — Assessment & Plan Note (Signed)
Tolerating statin. Obtain lipid panel, continue atorvastatin '10mg'$ .

## 2021-03-24 ENCOUNTER — Other Ambulatory Visit: Payer: Self-pay | Admitting: Family

## 2021-03-24 NOTE — Telephone Encounter (Signed)
Please advise pt that sugar is above goal.  I would like for her to add metformin '500mg'$  bid in addition to work on a diabetic diet.   Please send rx to pharmacy of her choice.

## 2021-03-25 ENCOUNTER — Other Ambulatory Visit: Payer: Self-pay

## 2021-03-25 MED ORDER — METFORMIN HCL 500 MG PO TABS: 500.0000 mg | ORAL_TABLET | Freq: Two times a day (BID) | ORAL | 1 refills | Status: DC

## 2021-03-25 NOTE — Telephone Encounter (Signed)
Patient advised of results and Rx sent [facility] at [facility] request

## 2021-04-12 ENCOUNTER — Other Ambulatory Visit (HOSPITAL_BASED_OUTPATIENT_CLINIC_OR_DEPARTMENT_OTHER): Payer: Self-pay

## 2021-05-06 ENCOUNTER — Encounter: Payer: Self-pay | Admitting: Family

## 2021-05-08 MED ORDER — PIOGLITAZONE HCL 30 MG PO TABS: 30.0000 mg | ORAL_TABLET | Freq: Every day | ORAL | 2 refills | Status: DC

## 2021-06-07 ENCOUNTER — Other Ambulatory Visit: Payer: Self-pay | Admitting: Family

## 2021-06-07 ENCOUNTER — Other Ambulatory Visit: Payer: Self-pay

## 2021-06-07 ENCOUNTER — Ambulatory Visit
Admission: RE | Admit: 2021-06-07 | Discharge: 2021-06-07 | Disposition: A | Discharge status: Home / Self Care | Payer: TRICARE For Life (TFL) | Source: Ambulatory Visit | Attending: Family | Admitting: Family

## 2021-06-07 DIAGNOSIS — Z1231 Encounter for screening mammogram for malignant neoplasm of breast: Secondary | ICD-10-CM

## 2021-06-20 ENCOUNTER — Other Ambulatory Visit: Payer: Self-pay

## 2021-06-20 ENCOUNTER — Ambulatory Visit (INDEPENDENT_AMBULATORY_CARE_PROVIDER_SITE_OTHER): Payer: TRICARE For Life (TFL) | Admitting: Family

## 2021-06-20 VITALS — BP 133/57 | HR 65 | Temp 97.5°F | Resp 16 | Wt 216.0 lb

## 2021-06-20 DIAGNOSIS — I10 Essential (primary) hypertension: Secondary | ICD-10-CM | POA: Diagnosis not present

## 2021-06-20 DIAGNOSIS — E1129 Type 2 diabetes mellitus with other diabetic kidney complication: Secondary | ICD-10-CM

## 2021-06-20 DIAGNOSIS — Z23 Encounter for immunization: Secondary | ICD-10-CM | POA: Diagnosis not present

## 2021-06-20 DIAGNOSIS — R809 Proteinuria, unspecified: Secondary | ICD-10-CM

## 2021-06-20 DIAGNOSIS — E785 Hyperlipidemia, unspecified: Secondary | ICD-10-CM | POA: Diagnosis not present

## 2021-06-20 LAB — BASIC METABOLIC PANEL
BUN: 13 mg/dL (ref 6–23)
CO2: 31 mEq/L (ref 19–32)
Calcium: 10 mg/dL (ref 8.4–10.5)
Chloride: 106 mEq/L (ref 96–112)
Creatinine, Ser: 0.96 mg/dL (ref 0.40–1.20)
GFR: 62.47 mL/min (ref >60.00)
Glucose, Bld: 140 mg/dL — ABNORMAL HIGH (ref 70–99)
Potassium: 4.6 mEq/L (ref 3.5–5.1)
Sodium: 142 mEq/L (ref 135–145)

## 2021-06-20 LAB — HEMOGLOBIN A1C: Hgb A1c MFr Bld: 6.9 % — ABNORMAL HIGH (ref 4.6–6.5)

## 2021-06-20 NOTE — Assessment & Plan Note (Addendum)
Lab Results  Component Value Date   HGBA1C 7.5 (H) 03/23/2021   HGBA1C 6.8 (H) 11/12/2020   HGBA1C 6.8 (H) 07/23/2020   Lab Results  Component Value Date   MICROALBUR 3.6 (H) 12/24/2020   LDLCALC 65 03/23/2021   CREATININE 0.87 03/23/2021   She is not taking Actos. Working hard on diet/weight loss.  Wt Readings from Last 3 Encounters:  06/20/21 216 lb (98 kg)  03/23/21 228 lb (103.4 kg)  12/24/20 229 lb (103.9 kg)

## 2021-06-20 NOTE — Progress Notes (Signed)
Subjective:   By signing my name below, I, [name], attest that this documentation has been prepared under the direction and in the presence of Nancy Alar NP. 06/20/2021    Patient ID: Nancy Deleon, female    DOB: 11-Sep-1955, 65 y.o.   MRN: [id number]  Chief Complaint  Patient presents with   Hypertension    Here for follow up   Coronary Artery Disease    HPI Patient is in today for a office visit.   Blood pressure- Her blood pressure is doing well during this visit. She continues taking 10 mg amlodipine daily PO and reports no new issues while taking it.   BP Readings from Last 3 Encounters:  06/20/21 (!) 133/57  03/23/21 138/63  12/24/20 (!) 133/43   Pulse Readings from Last 3 Encounters:  06/20/21 65  03/23/21 71  12/24/20 100   Cholesterol- She continues taking 10 mg Lipitor daily PO and reports no new issues while taking it. She is requesting to ween off of it due to having no recent issues with her cholesterol.   Lab Results  Component Value Date   CHOL 133 03/23/2021   HDL 54.30 03/23/2021   LDLCALC 65 03/23/2021   TRIG 70.0 03/23/2021   CHOLHDL 2 03/23/2021   Diabetes- She has never taken 30 mg Actos daily PO and is requesting to not be prescribed it. She has taken metformin prior but develops nausea while taking it. She is managing her diabetes with diet and exercise.   Lab Results  Component Value Date   HGBA1C 7.5 (H) 03/23/2021   Diet- She is planning to improve her diet. She reports losing 12 lbs on her current diet. She cut out most sweets and sweet drinks and mostly drinks water.   Wt Readings from Last 3 Encounters:  06/20/21 216 lb (98 kg)  03/23/21 228 lb (103.4 kg)  12/24/20 229 lb (103.9 kg)   Immunizations- She is receiving the flu vaccine during the visit. She is eligible for pneumonia vaccine and is interested in receiving it during this visit.   Health Maintenance Due  Topic Date Due   Pneumococcal Vaccine  2-45 Years old (2 - PCV) 06/12/2019   COVID-19 Vaccine (5 - Booster for Pfizer series) 02/18/2021   INFLUENZA VACCINE  03/07/2021    Past Medical History:  Diagnosis Date   Allergy    Fatty liver 04/03/2014   GERD (gastroesophageal reflux disease)    Hypertension    Intraductal papilloma of breast 08/15/2017   Multiple thyroid nodules 08/20/2017   Obesity    Type 2 diabetes mellitus, controlled, with renal complications ([place]) 11/05/299   12/18/19-pt states she does not have diabetes, just checks blood sugar as needed.    Past Surgical History:  Procedure Laterality Date   BIOPSY THYROID     BREAST BIOPSY  04/24/11   [facility] breast   BREAST EXCISIONAL BIOPSY     BREAST LUMPECTOMY WITH RADIOACTIVE SEED LOCALIZATION Left 05/01/2017   Procedure: LEFT BREAST LUMPECTOMY WITH RADIOACTIVE SEED LOCALIZATION;  Surgeon: Donnie Mesa, MD;  Location: [facility];  Service: General;  Laterality: Left;   CESAREAN SECTION  [id number]   COLONOSCOPY  2011   [facility]-  normal   toenail removal  09/2020   Dr. Cannon Kettle   TUBAL LIGATION     UPPER GASTROINTESTINAL ENDOSCOPY      Family History  Problem Relation Age of Onset   Lymphoma Mother    Dementia Mother  Cancer Father        lung   Colon cancer Maternal Uncle    Esophageal cancer Neg Hx    Pancreatic cancer Neg Hx    Rectal cancer Neg Hx    Stomach cancer Neg Hx    Thyroid disease Neg Hx    Colon polyps Neg Hx     Social History   Socioeconomic History   Marital status: Widowed    Spouse name: Not on file   Number of children: 2   Years of education: Not on file   Highest education level: Not on file  Occupational History    Employer: [employer]  Tobacco Use   Smoking status: Never   Smokeless tobacco: Never  Vaping Use   Vaping Use: Never used  Substance and Sexual Activity   Alcohol use: Yes    Alcohol/week: 0.0 standard drinks    Comment: occasional wine   Drug use: No   Sexual activity: Yes     Partners: Male    Birth control/protection: Post-menopausal    Comment: 1st intercourse 65 yo-Fewer than 5 partners  Other Topics Concern   Not on file  Social History Narrative   Regular exercise:  Walks 2-5 x weekly   Caffeine Use: no   Widowed   She has a son Nancy Deleon- he is 68 yrs old.   Foster parent.   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   Dog (Lab named Abby)         Social Determinants of Health   Financial Resource Strain: Not on file  Food Insecurity: Not on file  Transportation Needs: Not on file  Physical Activity: Not on file  Stress: Not on file  Social Connections: Not on file  Intimate Partner Violence: Not on file    Outpatient Medications Prior to Visit  Medication Sig Dispense Refill   acetaminophen (TYLENOL) 500 MG tablet Take 1,000 mg by mouth every 6 (six) hours as needed.     amLODipine (NORVASC) 10 MG tablet Take 1 tablet (10 mg total) by mouth daily. 90 tablet 1   atorvastatin (LIPITOR) 10 MG tablet Take 1 tablet (10 mg total) by mouth daily. 90 tablet 1   betamethasone dipropionate 0.05 % cream Apply topically 2 (two) times daily as needed. 30 g 0   blood glucose meter kit and supplies KIT Dispense based on patient and insurance preference. Check sugar 1-2 times daily.  (FOR ICD-9 250.00, 250.) 1 each 0   calcium-vitamin D (OSCAL WITH D) 500-200 MG-UNIT tablet Take 1 tablet by mouth.     COVID-19 mRNA Vac-TriS, Pfizer, (PFIZER-BIONT COVID-19 VAC-TRIS) SUSP injection Inject into the muscle. 0.3 mL 0   Multiple Vitamins-Minerals (CENTRUM ULTRA WOMENS) TABS Take 1 tablet by mouth daily.     pioglitazone (ACTOS) 30 MG tablet Take 1 tablet (30 mg total) by mouth daily. (Patient not taking: Reported on 06/20/2021) 30 tablet 2   Facility-Administered Medications Prior to Visit  Medication Dose Route Frequency Provider Last Rate Last Admin   0.9 %  sodium chloride infusion  500 mL Intravenous Once  Ladene Artist, MD        Allergies  Allergen Reactions   Metformin And Related Other (See Comments)    nausea   Naproxen Nausea And Vomiting    REACTION: vomiting   Ropinirole Nausea Only   Vicodin [Hydrocodone-Acetaminophen] Nausea And Vomiting   Propoxyphene N-Acetaminophen  Nausea And Vomiting    ROS     Objective:    Physical Exam Constitutional:      General: She is not in acute distress.    Appearance: Normal appearance. She is not ill-appearing.  HENT:     Head: Normocephalic and atraumatic.     Right Ear: External ear normal.     Left Ear: External ear normal.  Eyes:     Extraocular Movements: Extraocular movements intact.     Pupils: Pupils are equal, round, and reactive to light.  Cardiovascular:     Rate and Rhythm: Normal rate and regular rhythm.     Heart sounds: Normal heart sounds. No murmur heard.   No gallop.  Pulmonary:     Effort: Pulmonary effort is normal. No respiratory distress.     Breath sounds: Normal breath sounds. No wheezing or rales.  Skin:    General: Skin is warm and dry.  Neurological:     Mental Status: She is alert and oriented to person, place, and time.  Psychiatric:        Behavior: Behavior normal.    BP (!) 133/57 (BP Location: Right Arm, Patient Position: Sitting, Cuff Size: Large)   Pulse 65   Temp (!) 97.5 F (36.4 C) (Oral)   Resp 16   Wt 216 lb (98 kg)   LMP 02/28/2007   SpO2 100%   BMI 33.83 kg/m  Wt Readings from Last 3 Encounters:  06/20/21 216 lb (98 kg)  03/23/21 228 lb (103.4 kg)  12/24/20 229 lb (103.9 kg)       Assessment & Plan:   Problem List Items Addressed This Visit       Unprioritized   Type 2 diabetes mellitus, controlled, with renal complications ([place])    Lab Results  Component Value Date   HGBA1C 7.5 (H) 03/23/2021   HGBA1C 6.8 (H) 11/12/2020   HGBA1C 6.8 (H) 07/23/2020   Lab Results  Component Value Date   MICROALBUR 3.6 (H) 12/24/2020   LDLCALC 65 03/23/2021   CREATININE  0.87 03/23/2021  She is not taking Actos. Working hard on diet/weight loss.  Wt Readings from Last 3 Encounters:  06/20/21 216 lb (98 kg)  03/23/21 228 lb (103.4 kg)  12/24/20 229 lb (103.9 kg)         Relevant Orders   Hemoglobin E9H   Basic metabolic panel   Hyperlipidemia    Maintained on lipitor $RemoveBe'10mg'iGwjZvArd$  once daily.  We discussed that statins decrease CV risk in patient with DM2.       Essential hypertension    BP Readings from Last 3 Encounters:  06/20/21 (!) 133/57  03/23/21 138/63  12/24/20 (!) 133/43  Stable on amlodipine $RemoveBefor'10mg'BAbckwgLgAeb$  once daily. Continue same.       Other Visit Diagnoses     Needs flu shot    -  Primary   Relevant Orders   Flu Vaccine QUAD 6+ mos PF IM (Fluarix Quad PF)        No orders of the defined types were placed in this encounter.   I, Nancy Alar NP, personally preformed the services described in this documentation.  All medical record entries made by the scribe were at my direction and in my presence.  I have reviewed the chart and discharge instructions (if applicable) and agree that the record reflects my personal performance and is accurate and complete. 06/20/2021   I,[name],acting as a scribe for Nance Pear, NP.,have documented all relevant documentation  on the behalf of Nance Pear, NP,as directed by  Nance Pear, NP while in the presence of Nance Pear, NP.   Nance Pear, NP

## 2021-06-20 NOTE — Assessment & Plan Note (Signed)
Maintained on lipitor 10mg  once daily.  We discussed that statins decrease CV risk in patient with DM2.

## 2021-06-20 NOTE — Patient Instructions (Addendum)
Please complete lab work prior to leaving.   

## 2021-06-20 NOTE — Assessment & Plan Note (Signed)
BP Readings from Last 3 Encounters:  06/20/21 (!) 133/57  03/23/21 138/63  12/24/20 (!) 133/43   Stable on amlodipine 10mg  once daily. Continue same.

## 2021-09-20 ENCOUNTER — Ambulatory Visit: Payer: TRICARE For Life (TFL) | Admitting: Family

## 2021-09-21 ENCOUNTER — Ambulatory Visit (INDEPENDENT_AMBULATORY_CARE_PROVIDER_SITE_OTHER): Payer: TRICARE For Life (TFL) | Admitting: Family

## 2021-09-21 VITALS — BP 134/60 | HR 83 | Temp 98.2°F | Resp 18 | Ht 67.0 in | Wt 218.0 lb

## 2021-09-21 DIAGNOSIS — E785 Hyperlipidemia, unspecified: Secondary | ICD-10-CM | POA: Diagnosis not present

## 2021-09-21 DIAGNOSIS — R011 Cardiac murmur, unspecified: Secondary | ICD-10-CM | POA: Insufficient documentation

## 2021-09-21 DIAGNOSIS — R809 Proteinuria, unspecified: Secondary | ICD-10-CM | POA: Diagnosis not present

## 2021-09-21 DIAGNOSIS — I10 Essential (primary) hypertension: Secondary | ICD-10-CM

## 2021-09-21 DIAGNOSIS — E1129 Type 2 diabetes mellitus with other diabetic kidney complication: Secondary | ICD-10-CM | POA: Diagnosis not present

## 2021-09-21 LAB — HEMOGLOBIN A1C: Hgb A1c MFr Bld: 6.6 % — ABNORMAL HIGH (ref 4.6–6.5)

## 2021-09-21 NOTE — Assessment & Plan Note (Signed)
LDL at goal. Continue atorvastatin.  

## 2021-09-21 NOTE — Assessment & Plan Note (Signed)
Sounds louder than I remember. Will repeat echo.

## 2021-09-21 NOTE — Assessment & Plan Note (Signed)
BP at goal. Continue amlodipine.  

## 2021-09-21 NOTE — Progress Notes (Signed)
Subjective:   By signing my name below, I, [name], attest that this documentation has been prepared under the direction and in the presence of [name], NP 09/21/2021     Patient ID: Nancy Deleon, female    DOB: 08/14/1955, 66 y.o.   MRN: [id number]  Chief Complaint  Patient presents with   Follow-up    HPI Patient is in today for a office visit.  Blood pressure- She continues taking 10 mg amlodipine daily PO and reports no new issues while taking it. She denies having any leg swelling while taking it.  BP Readings from Last 3 Encounters:  09/21/21 134/60  06/20/21 (!) 133/57  03/23/21 138/63   Pulse Readings from Last 3 Encounters:  09/21/21 83  06/20/21 65  03/23/21 71   Cholesterol- She continues taking 10 mg atorvastatin daily PO and reports no new issues while taking it.  Lab Results  Component Value Date   CHOL 133 03/23/2021   HDL 54.30 03/23/2021   LDLCALC 65 03/23/2021   TRIG 70.0 03/23/2021   CHOLHDL 2 03/23/2021   Blood sugar- She stopped taking 30 mg actos daily PO to manage her blood sugar. She prefers to manage her blood sugar through diet and exercise.  Lab Results  Component Value Date   HGBA1C 6.9 (H) 06/20/2021    Health Maintenance Due  Topic Date Due   COVID-19 Vaccine (5 - Booster for Pfizer series) 02/18/2021    Past Medical History:  Diagnosis Date   Allergy    Fatty liver 04/03/2014   GERD (gastroesophageal reflux disease)    Hypertension    Intraductal papilloma of breast 08/15/2017   Multiple thyroid nodules 08/20/2017   Obesity    Type 2 diabetes mellitus, controlled, with renal complications ([place]) 02/09/1024   12/18/19-pt states she does not have diabetes, just checks blood sugar as needed.    Past Surgical History:  Procedure Laterality Date   BIOPSY THYROID     BREAST BIOPSY  04/24/11   [facility] breast   BREAST EXCISIONAL BIOPSY     BREAST LUMPECTOMY WITH RADIOACTIVE SEED LOCALIZATION Left 05/01/2017    Procedure: LEFT BREAST LUMPECTOMY WITH RADIOACTIVE SEED LOCALIZATION;  Surgeon: Donnie Mesa, MD;  Location: [facility];  Service: General;  Laterality: Left;   CESAREAN SECTION  [id number]   COLONOSCOPY  2011   [place]-  normal   toenail removal  09/2020   Dr. Cannon Kettle   TUBAL LIGATION     UPPER GASTROINTESTINAL ENDOSCOPY      Family History  Problem Relation Age of Onset   Lymphoma Mother    Dementia Mother    Cancer Father        lung   Colon cancer Maternal Uncle    Esophageal cancer Neg Hx    Pancreatic cancer Neg Hx    Rectal cancer Neg Hx    Stomach cancer Neg Hx    Thyroid disease Neg Hx    Colon polyps Neg Hx     Social History   Socioeconomic History   Marital status: Widowed    Spouse name: Not on file   Number of children: 2   Years of education: Not on file   Highest education level: Not on file  Occupational History    Employer: [employer]  Tobacco Use   Smoking status: Never   Smokeless tobacco: Never  Vaping Use   Vaping Use: Never used  Substance and Sexual Activity   Alcohol use:  Yes    Alcohol/week: 0.0 standard drinks    Comment: occasional wine   Drug use: No   Sexual activity: Yes    Partners: Male    Birth control/protection: Post-menopausal    Comment: 1st intercourse 66 yo-Fewer than 5 partners  Other Topics Concern   Not on file  Social History Narrative   Regular exercise:  Walks 2-5 x weekly   Caffeine Use: no   Widowed   She has a son [name]- he is 75 yrs old.   Foster parent.   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   Dog (Lab named Abby)         Social Determinants of Health   Financial Resource Strain: Not on file  Food Insecurity: Not on file  Transportation Needs: Not on file  Physical Activity: Not on file  Stress: Not on file  Social Connections: Not on file  Intimate Partner Violence: Not on file    Outpatient Medications Prior  to Visit  Medication Sig Dispense Refill   acetaminophen (TYLENOL) 500 MG tablet Take 1,000 mg by mouth every 6 (six) hours as needed.     amLODipine (NORVASC) 10 MG tablet Take 1 tablet (10 mg total) by mouth daily. 90 tablet 1   atorvastatin (LIPITOR) 10 MG tablet Take 1 tablet (10 mg total) by mouth daily. 90 tablet 1   betamethasone dipropionate 0.05 % cream Apply topically 2 (two) times daily as needed. 30 g 0   blood glucose meter kit and supplies KIT Dispense based on patient and insurance preference. Check sugar 1-2 times daily.  (FOR ICD-9 250.00, 250.) 1 each 0   calcium-vitamin D (OSCAL WITH D) 500-200 MG-UNIT tablet Take 1 tablet by mouth.     Multiple Vitamins-Minerals (CENTRUM ULTRA WOMENS) TABS Take 1 tablet by mouth daily.     COVID-19 mRNA Vac-TriS, Pfizer, (PFIZER-BIONT COVID-19 VAC-TRIS) SUSP injection Inject into the muscle. 0.3 mL 0   pioglitazone (ACTOS) 30 MG tablet Take 1 tablet (30 mg total) by mouth daily. 30 tablet 2   Facility-Administered Medications Prior to Visit  Medication Dose Route Frequency Provider Last Rate Last Admin   0.9 %  sodium chloride infusion  500 mL Intravenous Once Ladene Artist, MD        Allergies  Allergen Reactions   Metformin And Related Other (See Comments)    nausea   Naproxen Nausea And Vomiting    REACTION: vomiting   Ropinirole Nausea Only   Vicodin [Hydrocodone-Acetaminophen] Nausea And Vomiting   Propoxyphene N-Acetaminophen Nausea And Vomiting    ROS    See HPI  Objective:    Physical Exam Constitutional:      General: She is not in acute distress.    Appearance: Normal appearance. She is not ill-appearing.  HENT:     Head: Normocephalic and atraumatic.     Right Ear: External ear normal.     Left Ear: External ear normal.  Eyes:     Extraocular Movements: Extraocular movements intact.     Pupils: Pupils are equal, round, and reactive to light.  Cardiovascular:     Rate and Rhythm: Normal rate and regular  rhythm.     Pulses:          Dorsalis pedis pulses are 2+ on the right side and 2+ on the left side.       Posterior tibial pulses are 2+ on the  right side and 2+ on the left side.     Heart sounds: Murmur heard.  Systolic murmur is present with a grade of 2/6.  Pulmonary:     Effort: Pulmonary effort is normal. No respiratory distress.     Breath sounds: Normal breath sounds. No wheezing or rales.  Musculoskeletal:        General: No swelling.  Skin:    General: Skin is warm and dry.  Neurological:     Mental Status: She is alert and oriented to person, place, and time.  Psychiatric:        Behavior: Behavior normal.   Diabetic Foot Exam - Simple   Simple Foot Form Diabetic Foot exam was performed with the following findings: Yes 09/21/2021  7:57 AM  Visual Inspection No deformities, no ulcerations, no other skin breakdown bilaterally: Yes Sensation Testing Intact to touch and monofilament testing bilaterally: Yes Pulse Check Posterior Tibialis and Dorsalis pulse intact bilaterally: Yes Comments      BP 134/60    Pulse 83    Temp 98.2 F (36.8 C)    Resp 18    Ht 5' 7"  (1.702 m)    Wt 218 lb (98.9 kg)    LMP 02/28/2007    SpO2 100%    BMI 34.14 kg/m  Wt Readings from Last 3 Encounters:  09/21/21 218 lb (98.9 kg)  06/20/21 216 lb (98 kg)  03/23/21 228 lb (103.4 kg)       Assessment & Plan:   Problem List Items Addressed This Visit       Unprioritized   Type 2 diabetes mellitus, controlled, with renal complications ([place]) - Primary    Declines ARB at this time for microalbuminuria/renal protection.  Declines actos, prefers to work on diet. Obtain follow up A1C.       Relevant Orders   Hemoglobin G5O   Basic metabolic panel   Systolic murmur    Sounds louder than I remember. Will repeat echo.       Relevant Orders   ECHOCARDIOGRAM COMPLETE   Hyperlipidemia    LDL at goal. Continue atorvastatin.       Essential hypertension    BP at goal. Continue  amlodipine.         No orders of the defined types were placed in this encounter.   Cordella Register, NP, personally preformed the services described in this documentation.  All medical record entries made by the scribe were at my direction and in my presence.  I have reviewed the chart and discharge instructions (if applicable) and agree that the record reflects my personal performance and is accurate and complete. 09/21/2021   I,[name],acting as a Education administrator for Nance Pear, NP.,have documented all relevant documentation on the behalf of Nance Pear, NP,as directed by  Nance Pear, NP while in the presence of Nance Pear, NP.   Nance Pear, NP

## 2021-09-21 NOTE — Progress Notes (Deleted)
Subjective:     Patient ID: Nancy Deleon, female    DOB: 1955-09-20, 66 y.o.   MRN: [id number]  Chief Complaint  Patient presents with   Follow-up    HPI Patient is in today for follow up.  DM2-  Lab Results  Component Value Date   HGBA1C 6.9 (H) 06/20/2021   HGBA1C 7.5 (H) 03/23/2021   HGBA1C 6.8 (H) 11/12/2020   Lab Results  Component Value Date   MICROALBUR 3.6 (H) 12/24/2020   LDLCALC 65 03/23/2021   CREATININE 0.96 06/20/2021   Hyperlipidemia-  Lab Results  Component Value Date   CHOL 133 03/23/2021   HDL 54.30 03/23/2021   LDLCALC 65 03/23/2021   TRIG 70.0 03/23/2021   CHOLHDL 2 03/23/2021   HTN-  BP Readings from Last 3 Encounters:  09/21/21 134/60  06/20/21 (!) 133/57  03/23/21 138/63      Health Maintenance Due  Topic Date Due   COVID-19 Vaccine (5 - Booster for Pfizer series) 02/18/2021   FOOT EXAM  07/23/2021    Past Medical History:  Diagnosis Date   Allergy    Fatty liver 04/03/2014   GERD (gastroesophageal reflux disease)    Hypertension    Intraductal papilloma of breast 08/15/2017   Multiple thyroid nodules 08/20/2017   Obesity    Type 2 diabetes mellitus, controlled, with renal complications ([place]) 01/06/9508   12/18/19-pt states she does not have diabetes, just checks blood sugar as needed.    Past Surgical History:  Procedure Laterality Date   BIOPSY THYROID     BREAST BIOPSY  04/24/11   [facility] breast   BREAST EXCISIONAL BIOPSY     BREAST LUMPECTOMY WITH RADIOACTIVE SEED LOCALIZATION Left 05/01/2017   Procedure: LEFT BREAST LUMPECTOMY WITH RADIOACTIVE SEED LOCALIZATION;  Surgeon: Donnie Mesa, MD;  Location: [facility];  Service: General;  Laterality: Left;   CESAREAN SECTION  [id number]   COLONOSCOPY  2011   [place]-  normal   toenail removal  09/2020   Dr. Cannon Kettle   TUBAL LIGATION     UPPER GASTROINTESTINAL ENDOSCOPY      Family History  Problem Relation Age of Onset   Lymphoma Mother     Dementia Mother    Cancer Father        lung   Colon cancer Maternal Uncle    Esophageal cancer Neg Hx    Pancreatic cancer Neg Hx    Rectal cancer Neg Hx    Stomach cancer Neg Hx    Thyroid disease Neg Hx    Colon polyps Neg Hx     Social History   Socioeconomic History   Marital status: Widowed    Spouse name: Not on file   Number of children: 2   Years of education: Not on file   Highest education level: Not on file  Occupational History    Employer: [employer]  Tobacco Use   Smoking status: Never   Smokeless tobacco: Never  Vaping Use   Vaping Use: Never used  Substance and Sexual Activity   Alcohol use: Yes    Alcohol/week: 0.0 standard drinks    Comment: occasional wine   Drug use: No   Sexual activity: Yes    Partners: Male    Birth control/protection: Post-menopausal    Comment: 1st intercourse 66 yo-Fewer than 5 partners  Other Topics Concern   Not on file  Social History Narrative   Regular exercise:  Walks 2-5 x weekly  Caffeine Use: no   Widowed   She has a son [name]- he is 66 yrs old.   Foster parent.   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   Dog (Lab named Abby)         Social Determinants of Health   Financial Resource Strain: Not on file  Food Insecurity: Not on file  Transportation Needs: Not on file  Physical Activity: Not on file  Stress: Not on file  Social Connections: Not on file  Intimate Partner Violence: Not on file    Outpatient Medications Prior to Visit  Medication Sig Dispense Refill   acetaminophen (TYLENOL) 500 MG tablet Take 1,000 mg by mouth every 6 (six) hours as needed.     amLODipine (NORVASC) 10 MG tablet Take 1 tablet (10 mg total) by mouth daily. 90 tablet 1   atorvastatin (LIPITOR) 10 MG tablet Take 1 tablet (10 mg total) by mouth daily. 90 tablet 1   betamethasone dipropionate 0.05 % cream Apply topically 2 (two) times daily as needed. 30 g 0    blood glucose meter kit and supplies KIT Dispense based on patient and insurance preference. Check sugar 1-2 times daily.  (FOR ICD-9 250.00, 250.) 1 each 0   calcium-vitamin D (OSCAL WITH D) 500-200 MG-UNIT tablet Take 1 tablet by mouth.     COVID-19 mRNA Vac-TriS, Pfizer, (PFIZER-BIONT COVID-19 VAC-TRIS) SUSP injection Inject into the muscle. 0.3 mL 0   Multiple Vitamins-Minerals (CENTRUM ULTRA WOMENS) TABS Take 1 tablet by mouth daily.     pioglitazone (ACTOS) 30 MG tablet Take 1 tablet (30 mg total) by mouth daily. 30 tablet 2   Facility-Administered Medications Prior to Visit  Medication Dose Route Frequency Provider Last Rate Last Admin   0.9 %  sodium chloride infusion  500 mL Intravenous Once Ladene Artist, MD        Allergies  Allergen Reactions   Metformin And Related Other (See Comments)    nausea   Naproxen Nausea And Vomiting    REACTION: vomiting   Ropinirole Nausea Only   Vicodin [Hydrocodone-Acetaminophen] Nausea And Vomiting   Propoxyphene N-Acetaminophen Nausea And Vomiting    ROS     Objective:    Physical Exam  BP 134/60    Pulse 83    Temp 98.2 F (36.8 C)    Resp 18    Ht 5' 7" (1.702 m)    Wt 218 lb (98.9 kg)    LMP 02/28/2007    SpO2 100%    BMI 34.14 kg/m  Wt Readings from Last 3 Encounters:  09/21/21 218 lb (98.9 kg)  06/20/21 216 lb (98 kg)  03/23/21 228 lb (103.4 kg)       Assessment & Plan:   Problem List Items Addressed This Visit   None   I am having Nancy Deleon. Nancy Deleon maintain her blood glucose meter kit and supplies, calcium-vitamin D, Centrum Ultra Womens, acetaminophen, betamethasone dipropionate, Pfizer-BioNT COVID-19 Vac-TriS, amLODipine, atorvastatin, and pioglitazone. We will continue to administer sodium chloride.  No orders of the defined types were placed in this encounter.

## 2021-09-21 NOTE — Patient Instructions (Signed)
Please complete lab work prior to leaving.   

## 2021-09-21 NOTE — Assessment & Plan Note (Signed)
Declines ARB at this time for microalbuminuria/renal protection.  Declines actos, prefers to work on diet. Obtain follow up A1C.

## 2021-09-22 LAB — BASIC METABOLIC PANEL
BUN: 13 mg/dL (ref 6–23)
CO2: 31 mEq/L (ref 19–32)
Calcium: 9.5 mg/dL (ref 8.4–10.5)
Chloride: 105 mEq/L (ref 96–112)
Creatinine, Ser: 0.87 mg/dL (ref 0.40–1.20)
GFR: 70.18 mL/min (ref >60.00)
Glucose, Bld: 146 mg/dL — ABNORMAL HIGH (ref 70–99)
Potassium: 4.2 mEq/L (ref 3.5–5.1)
Sodium: 141 mEq/L (ref 135–145)

## 2021-09-29 ENCOUNTER — Ambulatory Visit: Admitting: Sports Medicine

## 2021-10-05 ENCOUNTER — Ambulatory Visit (INDEPENDENT_AMBULATORY_CARE_PROVIDER_SITE_OTHER): Payer: Medicare PPO | Admitting: Podiatry

## 2021-10-05 ENCOUNTER — Other Ambulatory Visit: Payer: Self-pay

## 2021-10-05 DIAGNOSIS — L989 Disorder of the skin and subcutaneous tissue, unspecified: Secondary | ICD-10-CM | POA: Diagnosis not present

## 2021-10-05 NOTE — Progress Notes (Signed)
? ?  Subjective: ?66 y.o. female presenting to the office today for new complaint regarding pain and tenderness associated to a symptomatic corn to the left fifth toe.  She says that she hit her toe a few weeks ago and the corn has been very symptomatic and painful ever since.  She presents for further treatment and evaluation ? ? ?Past Medical History:  ?Diagnosis Date  ? Allergy   ? Fatty liver 04/03/2014  ? GERD (gastroesophageal reflux disease)   ? Hypertension   ? 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 Multiple thyroid nodules 08/20/2017  ? Obesity   ? Type 2 diabetes mellitus, controlled, with renal complications ([place]) 5/0/5697  ? 12/18/19-pt states she does not have diabetes, just checks blood sugar as needed.  ? ?Past Surgical History:  ?Procedure Laterality Date  ? BIOPSY THYROID    ? BREAST BIOPSY  04/24/11  ? rigth breast  ? BREAST EXCISIONAL BIOPSY    ? BREAST LUMPECTOMY WITH RADIOACTIVE SEED LOCALIZATION Left 05/01/2017  ? Procedure: LEFT BREAST LUMPECTOMY WITH RADIOACTIVE SEED LOCALIZATION;  Surgeon: Donnie Mesa, MD;  Location: [facility];  Service: General;  Laterality: Left;  ? CESAREAN SECTION  [id number]  ? COLONOSCOPY  2011  ? [facility]-  normal  ? toenail removal  09/2020  ? Dr. Cannon Kettle  ? TUBAL LIGATION    ? UPPER GASTROINTESTINAL ENDOSCOPY    ? ?Allergies  ?Allergen Reactions  ? Metformin And Related Other (See Comments)  ?  nausea  ? Naproxen Nausea And Vomiting  ?  REACTION: vomiting  ? Ropinirole Nausea Only  ? Vicodin [Hydrocodone-Acetaminophen] Nausea And Vomiting  ? Propoxyphene N-Acetaminophen Nausea And Vomiting  ? ? ? ?Objective:  ?Physical Exam ?General: Alert and oriented x3 in no acute distress ? ?Dermatology: Hyperkeratotic lesion(s) present on the dorsal lateral aspect of the fifth toe left. Pain on palpation with a central nucleated core noted. Skin is warm, dry and supple bilateral lower extremities. Negative for open lesions or macerations. ? ?Vascular: Palpable  pedal pulses bilaterally. No edema or erythema noted. Capillary refill within normal limits. ? ?Neurological: Epicritic and protective threshold grossly intact bilaterally.  ? ?Musculoskeletal Exam: No pedal deformity noted ? ?Assessment: ?1.  Symptomatic corn left fifth toe ? ? ?Plan of Care:  ?1. Patient evaluated ?2. Excisional debridement of keratoic lesion(s) using a chisel blade was performed without incident.  ?3.  Recommend daily foot lotion and wide fitting shoes that do not constrict the toebox area ?4. Patient is to return to the clinic PRN.  ? ?Edrick Kins, DPM ?[facility] ? ?Dr. Edrick Kins, DPM  ?  ?[address]                                       ?[place], [place] [address]                ?Office [phone]  ?Fax [phone] ? ? ? ? ?

## 2021-10-18 ENCOUNTER — Telehealth: Payer: Self-pay | Admitting: Family

## 2021-10-18 DIAGNOSIS — R011 Cardiac murmur, unspecified: Secondary | ICD-10-CM

## 2021-10-18 NOTE — Telephone Encounter (Signed)
Pt appt was cancelled for Echo due to no auth on file for primary insurance  ? ?Pt would like for Melissa to contact her to discuss next steps  ? ? ?Please advise  ?

## 2021-10-19 ENCOUNTER — Ambulatory Visit (HOSPITAL_BASED_OUTPATIENT_CLINIC_OR_DEPARTMENT_OTHER): Payer: Medicare PPO

## 2021-10-20 NOTE — Telephone Encounter (Signed)
Lvm for patient to call back about this message 

## 2021-10-21 NOTE — Telephone Encounter (Signed)
Patient advised test was ordered again ?

## 2021-10-21 NOTE — Addendum Note (Signed)
Addended by: [name] on: 10/21/2021 01:47 PM ? ? Modules accepted: Orders ? ?

## 2021-11-07 ENCOUNTER — Telehealth: Payer: Self-pay | Admitting: Family

## 2021-11-07 NOTE — Telephone Encounter (Signed)
Pre services called regarding update on prior auth for echocardiogram. Please advise. She can be reached at [phone] ext [phone].  ?

## 2021-11-08 ENCOUNTER — Ambulatory Visit (HOSPITAL_BASED_OUTPATIENT_CLINIC_OR_DEPARTMENT_OTHER)
Admission: RE | Admit: 2021-11-08 | Discharge: 2021-11-08 | Disposition: A | Discharge status: Home / Self Care | Payer: Medicare PPO | Source: Ambulatory Visit | Attending: Family | Admitting: Family

## 2021-11-08 DIAGNOSIS — R011 Cardiac murmur, unspecified: Secondary | ICD-10-CM | POA: Diagnosis not present

## 2021-11-08 LAB — ECHOCARDIOGRAM COMPLETE
AR max vel: 1.95 cm2
AV Area VTI: 2.09 cm2
AV Area mean vel: 1.58 cm2
AV Mean grad: 7 mmHg
AV Peak grad: 13 mmHg
Ao pk vel: 1.8 m/s
Area-P 1/2: 3.1 cm2
S' Lateral: 3.1 cm

## 2021-11-08 NOTE — Progress Notes (Signed)
?  Echocardiogram ?2D Echocardiogram has been performed. ? ?Nancy Deleon ?11/08/2021, 2:58 PM ?

## 2021-11-14 ENCOUNTER — Other Ambulatory Visit: Payer: Self-pay | Admitting: Family

## 2021-12-19 ENCOUNTER — Other Ambulatory Visit (HOSPITAL_COMMUNITY)
Admission: RE | Admit: 2021-12-19 | Discharge: 2021-12-19 | Disposition: A | Discharge status: Home / Self Care | Payer: Medicare PPO | Source: Ambulatory Visit | Attending: Nurse Practitioner | Admitting: Nurse Practitioner

## 2021-12-19 ENCOUNTER — Ambulatory Visit (INDEPENDENT_AMBULATORY_CARE_PROVIDER_SITE_OTHER): Payer: Medicare PPO | Admitting: Nurse Practitioner

## 2021-12-19 ENCOUNTER — Ambulatory Visit (INDEPENDENT_AMBULATORY_CARE_PROVIDER_SITE_OTHER): Payer: Medicare PPO | Admitting: Family

## 2021-12-19 ENCOUNTER — Encounter: Payer: Self-pay | Admitting: Nurse Practitioner

## 2021-12-19 VITALS — BP 143/66 | HR 66 | Temp 98.4°F | Resp 16 | Wt 224.0 lb

## 2021-12-19 VITALS — BP 126/82 | Ht 67.0 in | Wt 223.0 lb

## 2021-12-19 DIAGNOSIS — Z01419 Encounter for gynecological examination (general) (routine) without abnormal findings: Secondary | ICD-10-CM | POA: Insufficient documentation

## 2021-12-19 DIAGNOSIS — J302 Other seasonal allergic rhinitis: Secondary | ICD-10-CM | POA: Diagnosis not present

## 2021-12-19 DIAGNOSIS — R809 Proteinuria, unspecified: Secondary | ICD-10-CM | POA: Diagnosis not present

## 2021-12-19 DIAGNOSIS — I10 Essential (primary) hypertension: Secondary | ICD-10-CM

## 2021-12-19 DIAGNOSIS — D509 Iron deficiency anemia, unspecified: Secondary | ICD-10-CM | POA: Diagnosis not present

## 2021-12-19 DIAGNOSIS — Z780 Asymptomatic menopausal state: Secondary | ICD-10-CM | POA: Diagnosis not present

## 2021-12-19 DIAGNOSIS — Z124 Encounter for screening for malignant neoplasm of cervix: Secondary | ICD-10-CM

## 2021-12-19 DIAGNOSIS — I071 Rheumatic tricuspid insufficiency: Secondary | ICD-10-CM

## 2021-12-19 DIAGNOSIS — E1129 Type 2 diabetes mellitus with other diabetic kidney complication: Secondary | ICD-10-CM

## 2021-12-19 DIAGNOSIS — Z1211 Encounter for screening for malignant neoplasm of colon: Secondary | ICD-10-CM | POA: Diagnosis not present

## 2021-12-19 MED ORDER — AZELASTINE HCL 0.1 % NA SOLN: 1.0000 | Freq: Two times a day (BID) | NASAL | 3 refills | Status: DC

## 2021-12-19 MED ORDER — CETIRIZINE HCL 10 MG PO TABS: 10.0000 mg | ORAL_TABLET | Freq: Every day | ORAL | 11 refills | Status: DC

## 2021-12-19 MED ORDER — VALSARTAN 80 MG PO TABS: 80.0000 mg | ORAL_TABLET | Freq: Every day | ORAL | 3 refills | Status: DC

## 2021-12-19 NOTE — Assessment & Plan Note (Addendum)
BP Readings from Last 3 Encounters:  ?12/19/21 (!) 143/66  ?09/21/21 134/60  ?06/20/21 (!) 133/57  ? ?Maintained on amlodipine '10mg'$ .  Slightly above goal. Will add valsartan '80mg'$  once daily for bp and renal protection.  ?

## 2021-12-19 NOTE — Assessment & Plan Note (Signed)
Mild to moderate on 2D echo 5/23. Clinically compensated. Monitor.  ?

## 2021-12-19 NOTE — Progress Notes (Addendum)
? ?Subjective:  ? ?By signing my name below, I, [name], attest that this documentation has been prepared under the direction and in the presence of [name] NP. 12/19/2021 ?  ? ? Patient ID: Nancy Deleon, female    DOB: [birth year], 66 y.o.   MRN: [id number] ? ?Chief Complaint  ?Patient presents with  ? Hypertension  ?  Here for follow up  ? Diabetes  ?  Here for follow up  ? Seasonal allergies  ?  Complains of allergies  ? ? ?Patient is in today for a follow up visit.  ? ?Allergies- She complains of her allergies worsening. She has tried taking OTC Claritin but found no change. She is planning on taking OTC zyrtec. She does not use Flonase nasal spray due to it causing her nose bleeds. She is willing to try Astelin nasal spray to manage her symptoms.  ?Blood pressure- Her blood pressure is elevated during this visit. She continues taking 10 mg amlodipine daily PO and reports no new issues while taking it. She reports having swelling in her feet when she is standing on her feet for long periods. She finds her symptoms improve after she elevates her feet.  ?BP Readings from Last 3 Encounters:  ?12/19/21 (!) 143/66  ?09/21/21 134/60  ?06/20/21 (!) 133/57  ? ?Pulse Readings from Last 3 Encounters:  ?12/19/21 66  ?09/21/21 83  ?06/20/21 65  ? ?Blood sugar- Her last a1c has reduced from the year prior. She is participating in regular exercise to control her blood sugars.  ?Lab Results  ?Component Value Date  ? HGBA1C 6.6 (H) 09/21/2021  ? ?Protein in urine- She was taking an ace inhibitor due to protein found in her urine but she stopped due to developing a cough while taking it. She is interested in taking an ARB class medication to manage her issues instead.  ?Supplements- She reports regularly taking a multi-vitamin daily.  ? ? ?Health Maintenance Due  ?Topic Date Due  ? COVID-19 Vaccine (5 - Booster for Pfizer series) 02/18/2021  ? URINE MICROALBUMIN  12/24/2021  ? ? ?Past Medical History:   ?Diagnosis Date  ? Allergy   ? Fatty liver 04/03/2014  ? GERD (gastroesophageal reflux disease)   ? Hypertension   ? 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 Multiple thyroid nodules 08/20/2017  ? Obesity   ? Type 2 diabetes mellitus, controlled, with renal complications ([facility]) 01/06/7034  ? 12/18/19-pt states she does not have diabetes, just checks blood sugar as needed.  ? ? ?Past Surgical History:  ?Procedure Laterality Date  ? BIOPSY THYROID    ? BREAST BIOPSY  04/24/11  ? [facility] breast  ? BREAST EXCISIONAL BIOPSY    ? BREAST LUMPECTOMY WITH RADIOACTIVE SEED LOCALIZATION Left 05/01/2017  ? Procedure: LEFT BREAST LUMPECTOMY WITH RADIOACTIVE SEED LOCALIZATION;  Surgeon: Donnie Mesa, MD;  Location: [facility];  Service: General;  Laterality: Left;  ? CESAREAN SECTION  [id number]  ? COLONOSCOPY  2011  ? [facility]-  normal  ? toenail removal  09/2020  ? Dr. Cannon Kettle  ? TUBAL LIGATION    ? UPPER GASTROINTESTINAL ENDOSCOPY    ? ? ?Family History  ?Problem Relation Age of Onset  ? Lymphoma Mother   ? Dementia Mother   ? Cancer Father   ?     lung  ? Colon cancer Maternal Uncle   ? Esophageal cancer Neg Hx   ? Pancreatic cancer Neg Hx   ? Rectal cancer  Neg Hx   ? Stomach cancer Neg Hx   ? Thyroid disease Neg Hx   ? Colon polyps Neg Hx   ? ? ?Social History  ? ?Socioeconomic History  ? Marital status: Widowed  ?  Spouse name: Not on file  ? Number of children: 2  ? Years of education: Not on file  ? Highest education level: Not on file  ?Occupational History  ?  Employer: [employer]  ?Tobacco Use  ? Smoking status: Never  ? Smokeless tobacco: Never  ?Vaping Use  ? Vaping Use: Never used  ?Substance and Sexual Activity  ? Alcohol use: Yes  ?  Alcohol/week: 0.0 standard drinks  ?  Comment: occasional wine  ? Drug use: No  ? Sexual activity: Yes  ?  Partners: Male  ?  Birth control/protection: Post-menopausal  ?  Comment: 1st intercourse 66 yo-Fewer than 5 partners  ?Other Topics Concern  ? Not on file   ?Social History Narrative  ? Regular exercise:  Walks 2-5 x weekly  ? Caffeine Use: no  ? Widowed  ? She has a son [name]- he is 85 yrs old.  ? Foster parent.  ? 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    ? [employer]ist  ? She has associates degree.  ? Dog (Lab named Abby)  ?   ?   ? ?Social Determinants of Health  ? ?Financial Resource Strain: Not on file  ?Food Insecurity: Not on file  ?Transportation Needs: Not on file  ?Physical Activity: Not on file  ?Stress: Not on file  ?Social Connections: Not on file  ?Intimate Partner Violence: Not on file  ? ? ?Outpatient Medications Prior to Visit  ?Medication Sig Dispense Refill  ? acetaminophen (TYLENOL) 500 MG tablet Take 1,000 mg by mouth every 6 (six) hours as needed.    ? amLODipine (NORVASC) 10 MG tablet Take 1 tablet (10 mg total) by mouth daily. 90 tablet 1  ? atorvastatin (LIPITOR) 10 MG tablet TAKE 1 TABLET DAILY 90 tablet 3  ? betamethasone dipropionate 0.05 % cream Apply topically 2 (two) times daily as needed. 30 g 0  ? blood glucose meter kit and supplies KIT Dispense based on patient and insurance preference. Check sugar 1-2 times daily.  (FOR ICD-9 250.00, 250.) 1 each 0  ? calcium-vitamin D (OSCAL WITH D) 500-200 MG-UNIT tablet Take 1 tablet by mouth.    ? Multiple Vitamins-Minerals (CENTRUM ULTRA WOMENS) TABS Take 1 tablet by mouth daily.    ? ?Facility-Administered Medications Prior to Visit  ?Medication Dose Route Frequency Provider Last Rate Last Admin  ? 0.9 %  sodium chloride infusion  500 mL Intravenous Once Ladene Artist, MD      ? ? ?Allergies  ?Allergen Reactions  ? Metformin And Related Other (See Comments)  ?  nausea  ? Naproxen Nausea And Vomiting  ?  REACTION: vomiting  ? Ropinirole Nausea Only  ? Vicodin [Hydrocodone-Acetaminophen] Nausea And Vomiting  ? Propoxyphene N-Acetaminophen Nausea And Vomiting  ? ? ?Review of Systems  ?Endo/Heme/Allergies:  Positive for environmental allergies.  ? ?   ?Objective:  ?  ?Physical  Exam ?Constitutional:   ?   General: She is not in acute distress. ?   Appearance: Normal appearance. She is not ill-appearing.  ?HENT:  ?   Head: Normocephalic and atraumatic.  ?   Right Ear: External ear normal.  ?   Left Ear: External ear normal.  ?Eyes:  ?  Extraocular Movements: Extraocular movements intact.  ?   Pupils: Pupils are equal, round, and reactive to light.  ?Cardiovascular:  ?   Rate and Rhythm: Normal rate and regular rhythm.  ?   Heart sounds: Murmur heard.  ?  No gallop.  ?Pulmonary:  ?   Effort: Pulmonary effort is normal. No respiratory distress.  ?   Breath sounds: Normal breath sounds. No wheezing or rales.  ?Skin: ?   General: Skin is warm and dry.  ?Neurological:  ?   Mental Status: She is alert and oriented to person, place, and time.  ?Psychiatric:     ?   Judgment: Judgment normal.  ? ? ?BP (!) 143/66 (BP Location: Right Arm, Patient Position: Sitting, Cuff Size: Large)   Pulse 66   Temp 98.4 ?F (36.9 ?C) (Oral)   Resp 16   Wt 224 lb (101.6 kg)   LMP 02/28/2007   SpO2 98%   BMI 35.08 kg/m?  ?Wt Readings from Last 3 Encounters:  ?12/19/21 224 lb (101.6 kg)  ?09/21/21 218 lb (98.9 kg)  ?06/20/21 216 lb (98 kg)  ? ? ?   ?Assessment & Plan:  ? ?Problem List Items Addressed This Visit   ? ?  ? Unprioritized  ? Type 2 diabetes mellitus, controlled, with renal complications ([facility])  ?  Lab Results  ?Component Value Date  ? HGBA1C 6.6 (H) 09/21/2021  ? HGBA1C 6.9 (H) 06/20/2021  ? HGBA1C 7.5 (H) 03/23/2021  ? ?Lab Results  ?Component Value Date  ? MICROALBUR 3.6 (H) 12/24/2020  ? [place] 66 03/23/2021  ? CREATININE 0.87 09/21/2021  ?Too soon to repeat A1C. Plan to repeat in 2 weeks when she returns.  ?  ?  ? Relevant Medications  ? valsartan (DIOVAN) 80 MG tablet  ? Tricuspid regurgitation  ?  Mild to moderate on 2D echo 5/23. Clinically compensated. Monitor.  ? ?  ?  ? Relevant Medications  ? valsartan (DIOVAN) 80 MG tablet  ? Seasonal allergies  ?  Uncontrolled on claritin. Has  nosebleeds on flonase. Advised pt to d/c claritin. Add Zyrtec and astelin spray.  ? ?  ?  ? Microalbuminuria  ?  Will give trial of ARB. Had cough on ace so will monitor.  ? ?  ?  ? Relevant Medications  ? valsartan (DIOVAN) 80

## 2021-12-19 NOTE — Progress Notes (Signed)
? ?  Nancy Deleon 22-Feb-1956 [id number] ? ? ?History:  66 y.o. G1P1 presents for breast and pelvic exam without GYN complaints. Postmenopausal - no HRT, no bleeding. Normal pap history. 2018 left breast lumpectomy with radioactive seed placement. DM managed by PCP.  ? ?Gynecologic History ?Patient's last menstrual period was 02/28/2007. ?  ?Contraception/Family planning: post menopausal status ?Sexually active: No ? ?Health Maintenance ?Last Pap: 10/09/2018. Results were: Normal ?Last mammogram: 06/07/2021. Results were: normal ?Last colonoscopy: 12/2019. Results were: Normal, 10-year recall ?Last Dexa: 09/20/2018. Results were: Normal, 5-year recall ? ?Past medical history, past surgical history, family history and social history were all reviewed and documented in the EPIC chart. Widowed. Son married 12/2020, lives in [place]. Patient partially retired.  ? ?ROS:  A ROS was performed and pertinent positives and negatives are included. ? ?Exam: ? ?Vitals:  ? 12/19/21 1502  ?BP: 126/82  ?Weight: 223 lb (101.2 kg)  ?Height: '5\' 7"'$  (1.702 m)  ? ? ?Body mass index is 34.93 kg/m?. ? ?General appearance:  Normal ?Thyroid:  Symmetrical, normal in size, without palpable masses or nodularity. ?Respiratory ? Auscultation:  Clear without wheezing or rhonchi ?Cardiovascular ? Auscultation:  Regular rate, without rubs, murmurs or gallops ? Edema/varicosities:  Not grossly evident ?Abdominal ? Soft,nontender, without masses, guarding or rebound. ? Liver/spleen:  No organomegaly noted ? Hernia:  None appreciated ? Skin ? Inspection:  Grossly normal ?Breasts: Examined lying and sitting.  ? Right: Without masses, retractions, nipple discharge or axillary adenopathy. ? ? Left: Without masses, retractions, nipple discharge or axillary adenopathy. ?Gentitourinary  ? Inguinal/mons:  Normal without inguinal adenopathy ? External genitalia:  Normal appearing vulva with no masses, tenderness, or lesions ? BUS/Urethra/Skene's glands:   Normal ? Vagina:  Normal appearing with normal color and discharge, no lesions ? Cervix:  Normal appearing without discharge or lesions ? Uterus:  Normal in size, shape and contour.  Midline and mobile, nontender ? Adnexa/parametria:   ?  Rt: Normal in size, without masses or tenderness. ?  Lt: Normal in size, without masses or tenderness. ? Anus and perineum: Normal ? Digital rectal exam: Normal sphincter tone without palpated masses or tenderness ? ?Assessment/Plan:  66 y.o. G1P1 for breast and pelvic exam.  ? ?Well female exam with routine gynecological exam - Education provided on SBEs, importance of preventative screenings, current guidelines, high calcium diet, regular exercise, and multivitamin daily. Labs with PCP.  ? ?Postmenopausal - no HRT, no bleeding. ? ?Screening for cervical cancer - Normal Pap history. Pap with reflex today. If normal she is agreeable to stop screenings per guidelines.  ? ?Screening for breast cancer - Normal mammogram history.  Continue annual screenings.  Normal breast exam today. ? ?Screening for colon cancer - 12/2019 normal colonoscopy. Will repeat at GI's recommended interval.  ? ?Screening for osteoporosis - normal DXA 2020. Will repeat at 5-year interval per recommendation.  ? ?Return in 2 years for breast and pelvic exam.  ? ? ?Tamela Gammon DNP, 3:30 PM 12/19/2021 ? ?

## 2021-12-19 NOTE — Assessment & Plan Note (Signed)
Lab Results  ?Component Value Date  ? WBC 5.2 09/16/2018  ? HGB 13.2 09/16/2018  ? HCT 38.7 09/16/2018  ? MCV 74.0 (L) 09/16/2018  ? PLT 166.0 09/16/2018  ? ? ?

## 2021-12-19 NOTE — Patient Instructions (Signed)
Please add valsartan once daily for blood pressure and kidney protection.  ?

## 2021-12-19 NOTE — Assessment & Plan Note (Addendum)
Lab Results  ?Component Value Date  ? HGBA1C 6.6 (H) 09/21/2021  ? HGBA1C 6.9 (H) 06/20/2021  ? HGBA1C 7.5 (H) 03/23/2021  ? ?Lab Results  ?Component Value Date  ? MICROALBUR 3.6 (H) 12/24/2020  ? [place] 66 03/23/2021  ? CREATININE 0.87 09/21/2021  ? ?Too soon to repeat A1C. Plan to repeat in 2 weeks when she returns.  ?

## 2021-12-19 NOTE — Assessment & Plan Note (Signed)
Will give trial of ARB. Had cough on ace so will monitor.  ?

## 2021-12-19 NOTE — Assessment & Plan Note (Signed)
Uncontrolled on claritin. Has nosebleeds on flonase. Advised pt to d/c claritin. Add Zyrtec and astelin spray.  ?

## 2021-12-20 LAB — CYTOLOGY - PAP
Adequacy: ABSENT
Diagnosis: NEGATIVE

## 2022-01-03 ENCOUNTER — Ambulatory Visit (INDEPENDENT_AMBULATORY_CARE_PROVIDER_SITE_OTHER): Payer: Medicare PPO | Admitting: Family

## 2022-01-03 VITALS — BP 131/58 | HR 57 | Temp 98.4°F | Resp 16 | Wt 225.0 lb

## 2022-01-03 DIAGNOSIS — D509 Iron deficiency anemia, unspecified: Secondary | ICD-10-CM | POA: Diagnosis not present

## 2022-01-03 DIAGNOSIS — J301 Allergic rhinitis due to pollen: Secondary | ICD-10-CM

## 2022-01-03 DIAGNOSIS — R809 Proteinuria, unspecified: Secondary | ICD-10-CM

## 2022-01-03 DIAGNOSIS — I071 Rheumatic tricuspid insufficiency: Secondary | ICD-10-CM

## 2022-01-03 DIAGNOSIS — E785 Hyperlipidemia, unspecified: Secondary | ICD-10-CM

## 2022-01-03 DIAGNOSIS — E1129 Type 2 diabetes mellitus with other diabetic kidney complication: Secondary | ICD-10-CM | POA: Diagnosis not present

## 2022-01-03 DIAGNOSIS — I10 Essential (primary) hypertension: Secondary | ICD-10-CM

## 2022-01-03 DIAGNOSIS — E1169 Type 2 diabetes mellitus with other specified complication: Secondary | ICD-10-CM

## 2022-01-03 LAB — COMPREHENSIVE METABOLIC PANEL
ALT: 11 U/L (ref 0–35)
AST: 14 U/L (ref 0–37)
Albumin: 4.3 g/dL (ref 3.5–5.2)
Alkaline Phosphatase: 62 U/L (ref 39–117)
BUN: 12 mg/dL (ref 6–23)
CO2: 29 mEq/L (ref 19–32)
Calcium: 9.9 mg/dL (ref 8.4–10.5)
Chloride: 107 mEq/L (ref 96–112)
Creatinine, Ser: 0.85 mg/dL (ref 0.40–1.20)
GFR: 72.02 mL/min (ref >60.00)
Glucose, Bld: 143 mg/dL — ABNORMAL HIGH (ref 70–99)
Potassium: 4.2 mEq/L (ref 3.5–5.1)
Sodium: 142 mEq/L (ref 135–145)
Total Bilirubin: 0.9 mg/dL (ref 0.2–1.2)
Total Protein: 6.7 g/dL (ref 6.0–8.3)

## 2022-01-03 LAB — CBC WITH DIFFERENTIAL/PLATELET
Basophils Absolute: 0.1 10*3/uL (ref 0.0–0.1)
Basophils Relative: 0.9 % (ref 0.0–3.0)
Eosinophils Absolute: 0.2 10*3/uL (ref 0.0–0.7)
Eosinophils Relative: 3 % (ref 0.0–5.0)
HCT: 37.7 % (ref 36.0–46.0)
Hemoglobin: 12.7 g/dL (ref 12.0–15.0)
Lymphocytes Relative: 38.2 % (ref 12.0–46.0)
Lymphs Abs: 2.1 10*3/uL (ref 0.7–4.0)
MCHC: 33.8 g/dL (ref 30.0–36.0)
MCV: 74.5 fl — ABNORMAL LOW (ref 78.0–100.0)
Monocytes Absolute: 0.4 10*3/uL (ref 0.1–1.0)
Monocytes Relative: 6.7 % (ref 3.0–12.0)
Neutro Abs: 2.8 10*3/uL (ref 1.4–7.7)
Neutrophils Relative %: 51.2 % (ref 43.0–77.0)
Platelets: 154 10*3/uL (ref 150.0–400.0)
RBC: 5.05 Mil/uL (ref 3.87–5.11)
RDW: 13.9 % (ref 11.5–15.5)
WBC: 5.4 10*3/uL (ref 4.0–10.5)

## 2022-01-03 LAB — FERRITIN: Ferritin: 96.6 ng/mL (ref 10.0–291.0)

## 2022-01-03 LAB — HEMOGLOBIN A1C: Hgb A1c MFr Bld: 7 % — ABNORMAL HIGH (ref 4.6–6.5)

## 2022-01-03 LAB — IRON: Iron: 93 ug/dL (ref 42–145)

## 2022-01-03 NOTE — Assessment & Plan Note (Signed)
BP is stable. Continue amlodipine '10mg'$ .

## 2022-01-03 NOTE — Progress Notes (Signed)
Subjective:   By signing my name below, I, Nancy Deleon, attest that this documentation has been prepared under the direction and in the presence of Karie Chimera, NP 01/03/2022   Patient ID: Nancy Deleon, female    DOB: 12-10-1955, 66 y.o.   MRN: [id number]  Chief Complaint  Patient presents with   Allergic Rhinitis     Here for follow up   Hypertension    Here for follow up, was not able to take Valsartan 80 mg due to side effects.    Ear Fullness    Complains of right ear fullness    HPI Patient is in today for an office visit.  Right Ear - She complains that her right ear is bothering her. She reports that her right ear "feels weird". She denies of any pain. Her allergies have been under control.   Blood Pressure - She reports that she discontinued use of 80 Mg of Valsartan about a week ago because the medication gave her a cough. Losartan also gave her cough when she was taking the medication. BP Readings from Last 3 Encounters:  01/03/22 (!) 131/58  12/19/21 126/82  12/19/21 (!) 143/66   Pulse Readings from Last 3 Encounters:  01/03/22 (!) 57  12/19/21 66  09/21/21 83    Health Maintenance Due  Topic Date Due   COVID-19 Vaccine (5 - Booster for Pfizer series) 02/18/2021    Past Medical History:  Diagnosis Date   Allergy    Fatty liver 04/03/2014   GERD (gastroesophageal reflux disease)    Hypertension    Intraductal papilloma of breast 08/15/2017   Multiple thyroid nodules 08/20/2017   Obesity    Type 2 diabetes mellitus, controlled, with renal complications ([place]) 12/13/5275   12/18/19-pt states she does not have diabetes, just checks blood sugar as needed.    Past Surgical History:  Procedure Laterality Date   BIOPSY THYROID     BREAST BIOPSY  04/24/11   [facility] breast   BREAST EXCISIONAL BIOPSY     BREAST LUMPECTOMY WITH RADIOACTIVE SEED LOCALIZATION Left 05/01/2017   Procedure: LEFT BREAST LUMPECTOMY WITH RADIOACTIVE SEED LOCALIZATION;   Surgeon: Donnie Mesa, MD;  Location: [facility];  Service: General;  Laterality: Left;   CESAREAN SECTION  [id number]   COLONOSCOPY  2011   [facility]-  normal   toenail removal  09/2020   Dr. Cannon Kettle   TUBAL LIGATION     UPPER GASTROINTESTINAL ENDOSCOPY      Family History  Problem Relation Age of Onset   Lymphoma Mother    Dementia Mother    Cancer Father        lung   Colon cancer Maternal Uncle    Esophageal cancer Neg Hx    Pancreatic cancer Neg Hx    Rectal cancer Neg Hx    Stomach cancer Neg Hx    Thyroid disease Neg Hx    Colon polyps Neg Hx     Social History   Socioeconomic History   Marital status: Widowed    Spouse name: Not on file   Number of children: 2   Years of education: Not on file   Highest education level: Not on file  Occupational History    Employer: [employer]  Tobacco Use   Smoking status: Never   Smokeless tobacco: Never  Vaping Use   Vaping Use: Never used  Substance and Sexual Activity   Alcohol use: Yes    Alcohol/week: 0.0  standard drinks    Comment: occasional wine   Drug use: No   Sexual activity: Not Currently    Partners: Male    Birth control/protection: Post-menopausal    Comment: 1st intercourse 66 yo-Fewer than 5 partners  Other Topics Concern   Not on file  Social History Narrative   Regular exercise:  Walks 2-5 x weekly   Caffeine Use: no   Widowed   She has a son Nancy Deleon- he is 38 yrs old.   Foster parent.   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   Dog (Lab named Abby)         Social Determinants of Health   Financial Resource Strain: Not on file  Food Insecurity: Not on file  Transportation Needs: Not on file  Physical Activity: Not on file  Stress: Not on file  Social Connections: Not on file  Intimate Partner Violence: Not on file    Outpatient Medications Prior to Visit  Medication Sig Dispense Refill   acetaminophen  (TYLENOL) 500 MG tablet Take 1,000 mg by mouth every 6 (six) hours as needed.     amLODipine (NORVASC) 10 MG tablet Take 1 tablet (10 mg total) by mouth daily. 90 tablet 1   atorvastatin (LIPITOR) 10 MG tablet TAKE 1 TABLET DAILY 90 tablet 3   azelastine (ASTELIN) 0.1 % nasal spray Place 1 spray into both nostrils 2 (two) times daily. Use in each nostril as directed (Patient not taking: Reported on 12/19/2021) 30 mL 3   betamethasone dipropionate 0.05 % cream Apply topically 2 (two) times daily as needed. (Patient not taking: Reported on 12/19/2021) 30 g 0   blood glucose meter kit and supplies KIT Dispense based on patient and insurance preference. Check sugar 1-2 times daily.  (FOR ICD-9 250.00, 250.) 1 each 0   calcium-vitamin D (OSCAL WITH D) 500-200 MG-UNIT tablet Take 1 tablet by mouth.     cetirizine (ZYRTEC) 10 MG tablet Take 1 tablet (10 mg total) by mouth daily. 30 tablet 11   Multiple Vitamins-Minerals (CENTRUM ULTRA WOMENS) TABS Take 1 tablet by mouth daily.     valsartan (DIOVAN) 80 MG tablet Take 1 tablet (80 mg total) by mouth daily. 30 tablet 3   Facility-Administered Medications Prior to Visit  Medication Dose Route Frequency Provider Last Rate Last Admin   0.9 %  sodium chloride infusion  500 mL Intravenous Once Ladene Artist, MD        Allergies  Allergen Reactions   Lisinopril     cough   Metformin And Related Other (See Comments)    nausea   Naproxen Nausea And Vomiting    REACTION: vomiting   Ropinirole Nausea Only   Valsartan     Cough    Vicodin [Hydrocodone-Acetaminophen] Nausea And Vomiting   Propoxyphene N-Acetaminophen Nausea And Vomiting    Review of Systems  HENT:         (+) Right Ear Bothering Her      Objective:    Physical Exam Constitutional:      General: She is not in acute distress.    Appearance: Normal appearance. She is not ill-appearing.  HENT:     Head: Normocephalic and atraumatic.     Right Ear: Tympanic membrane, ear canal  and external ear normal. There is no impacted cerumen.     Left Ear: Tympanic membrane, ear canal and external ear normal.  Eyes:  Extraocular Movements: Extraocular movements intact.     Pupils: Pupils are equal, round, and reactive to light.  Neck:     Thyroid: No thyromegaly.  Cardiovascular:     Rate and Rhythm: Normal rate and regular rhythm.     Heart sounds: Murmur heard.  Systolic murmur is present with a grade of 2/6.    No gallop.  Pulmonary:     Effort: Pulmonary effort is normal. No respiratory distress.     Breath sounds: Normal breath sounds. No wheezing or rales.  Lymphadenopathy:     Cervical: No cervical adenopathy.  Skin:    General: Skin is warm and dry.  Neurological:     Mental Status: She is alert and oriented to person, place, and time.  Psychiatric:        Mood and Affect: Mood normal.        Behavior: Behavior normal.        Judgment: Judgment normal.    BP (!) 131/58 (BP Location: Right Arm, Patient Position: Sitting, Cuff Size: Large)   Pulse (!) 57   Temp 98.4 F (36.9 C) (Oral)   Resp 16   Wt 225 lb (102.1 kg)   LMP 02/28/2007   SpO2 99%   BMI 35.24 kg/m  Wt Readings from Last 3 Encounters:  01/03/22 225 lb (102.1 kg)  12/19/21 223 lb (101.2 kg)  12/19/21 224 lb (101.6 kg)       Assessment & Plan:   Problem List Items Addressed This Visit       Unprioritized   Type 2 diabetes mellitus, controlled, with renal complications ([place])    Unfortunately, she cant tolerate ace or arbs secondary to cough. Continue tight glucose control with DM diet, exercise, weight loss.        Relevant Orders   Hemoglobin A1c   Comp Met (CMET)   Tricuspid regurgitation    Mild to moderate per 2D echo.  Clinically compensated. Plan to repeat echo in 1 year, sooner if pt becomes symptomatic.        Hyperlipidemia    Maintained on atorvastatin. Continue same. LDL at goal. Lab Results  Component Value Date   CHOL 133 03/23/2021   HDL 54.30  03/23/2021   LDLCALC 65 03/23/2021   TRIG 70.0 03/23/2021   CHOLHDL 2 03/23/2021         Essential hypertension    BP is stable. Continue amlodipine 10mg .        ANEMIA, IRON DEFICIENCY - Primary    Check follow up iron levels/cbc. Continue MVI with minerals.        Relevant Orders   Iron   Ferritin   CBC with Differential/Platelet   Allergic rhinitis    Notes some recent improvement with decrease in pollen counts.  Continue astelin and zyrtec.        Other Visit Diagnoses     Hyperlipidemia associated with type 2 diabetes mellitus ([place])             No orders of the defined types were placed in this encounter.   I, Nance Pear, NP, personally preformed the services described in this documentation.  All medical record entries made by the scribe were at my direction and in my presence.  I have reviewed the chart and discharge instructions (if applicable) and agree that the record reflects my personal performance and is accurate and complete. 01/03/2022   I,Amber Collins,acting as a scribe for Nance Pear, NP.,have documented all relevant documentation  on the behalf of Nance Pear, NP,as directed by  Nance Pear, NP while in the presence of Nance Pear, NP.    Nance Pear, NP

## 2022-01-03 NOTE — Assessment & Plan Note (Signed)
>>  ASSESSMENT AND PLAN FOR ALLERGIC RHINITIS WRITTEN ON 01/03/2022 10:56 AM BY O'SULLIVAN, Pami Wool, NP  Notes some recent improvement with decrease in pollen counts.  Continue astelin  and zyrtec .

## 2022-01-03 NOTE — Assessment & Plan Note (Signed)
Maintained on atorvastatin. Continue same. LDL at goal. Lab Results  Component Value Date   CHOL 133 03/23/2021   HDL 54.30 03/23/2021   LDLCALC 65 03/23/2021   TRIG 70.0 03/23/2021   CHOLHDL 2 03/23/2021

## 2022-01-03 NOTE — Assessment & Plan Note (Signed)
Mild to moderate per 2D echo.  Clinically compensated. Plan to repeat echo in 1 year, sooner if pt becomes symptomatic.

## 2022-01-03 NOTE — Assessment & Plan Note (Signed)
Notes some recent improvement with decrease in pollen counts.  Continue astelin and zyrtec.

## 2022-01-03 NOTE — Assessment & Plan Note (Signed)
Check follow up iron levels/cbc. Continue MVI with minerals.

## 2022-01-03 NOTE — Assessment & Plan Note (Signed)
Unfortunately, she cant tolerate ace or arbs secondary to cough. Continue tight glucose control with DM diet, exercise, weight loss.

## 2022-01-18 ENCOUNTER — Other Ambulatory Visit: Payer: Self-pay | Admitting: Family

## 2022-04-19 DIAGNOSIS — H25013 Cortical age-related cataract, bilateral: Secondary | ICD-10-CM | POA: Diagnosis not present

## 2022-04-19 DIAGNOSIS — H40013 Open angle with borderline findings, low risk, bilateral: Secondary | ICD-10-CM | POA: Diagnosis not present

## 2022-04-24 ENCOUNTER — Ambulatory Visit: Payer: TRICARE For Life (TFL) | Admitting: Family

## 2022-04-28 ENCOUNTER — Encounter: Payer: Self-pay | Admitting: Family

## 2022-04-28 ENCOUNTER — Ambulatory Visit (INDEPENDENT_AMBULATORY_CARE_PROVIDER_SITE_OTHER): Payer: Medicare PPO | Admitting: Family

## 2022-04-28 ENCOUNTER — Other Ambulatory Visit (INDEPENDENT_AMBULATORY_CARE_PROVIDER_SITE_OTHER): Payer: Medicare PPO

## 2022-04-28 ENCOUNTER — Telehealth: Payer: Self-pay | Admitting: Family

## 2022-04-28 VITALS — BP 128/55 | HR 72 | Temp 98.1°F | Resp 16 | Wt 222.0 lb

## 2022-04-28 DIAGNOSIS — R809 Proteinuria, unspecified: Secondary | ICD-10-CM | POA: Diagnosis not present

## 2022-04-28 DIAGNOSIS — E1129 Type 2 diabetes mellitus with other diabetic kidney complication: Secondary | ICD-10-CM

## 2022-04-28 DIAGNOSIS — D509 Iron deficiency anemia, unspecified: Secondary | ICD-10-CM

## 2022-04-28 DIAGNOSIS — I10 Essential (primary) hypertension: Secondary | ICD-10-CM | POA: Diagnosis not present

## 2022-04-28 DIAGNOSIS — J301 Allergic rhinitis due to pollen: Secondary | ICD-10-CM

## 2022-04-28 DIAGNOSIS — J302 Other seasonal allergic rhinitis: Secondary | ICD-10-CM

## 2022-04-28 DIAGNOSIS — Z23 Encounter for immunization: Secondary | ICD-10-CM | POA: Diagnosis not present

## 2022-04-28 DIAGNOSIS — E785 Hyperlipidemia, unspecified: Secondary | ICD-10-CM | POA: Diagnosis not present

## 2022-04-28 DIAGNOSIS — E1122 Type 2 diabetes mellitus with diabetic chronic kidney disease: Secondary | ICD-10-CM

## 2022-04-28 LAB — COMPREHENSIVE METABOLIC PANEL
ALT: 12 U/L (ref 0–35)
AST: 15 U/L (ref 0–37)
Albumin: 4 g/dL (ref 3.5–5.2)
Alkaline Phosphatase: 71 U/L (ref 39–117)
BUN: 12 mg/dL (ref 6–23)
CO2: 29 mEq/L (ref 19–32)
Calcium: 9.5 mg/dL (ref 8.4–10.5)
Chloride: 105 mEq/L (ref 96–112)
Creatinine, Ser: 0.85 mg/dL (ref 0.40–1.20)
GFR: 71.86 mL/min (ref >60.00)
Glucose, Bld: 139 mg/dL — ABNORMAL HIGH (ref 70–99)
Potassium: 3.7 mEq/L (ref 3.5–5.1)
Sodium: 141 mEq/L (ref 135–145)
Total Bilirubin: 1 mg/dL (ref 0.2–1.2)
Total Protein: 6.8 g/dL (ref 6.0–8.3)

## 2022-04-28 LAB — MICROALBUMIN / CREATININE URINE RATIO
Creatinine,U: 245 mg/dL
Microalb Creat Ratio: 2.6 mg/g (ref 0.0–30.0)
Microalb, Ur: 6.3 mg/dL — ABNORMAL HIGH (ref 0.0–1.9)

## 2022-04-28 LAB — LIPID PANEL
Cholesterol: 137 mg/dL (ref 0–200)
HDL: 59.3 mg/dL (ref >39.00)
LDL Cholesterol: 63 mg/dL (ref 0–99)
NonHDL: 78.15
Total CHOL/HDL Ratio: 2
Triglycerides: 74 mg/dL (ref 0.0–149.0)
VLDL: 14.8 mg/dL (ref 0.0–40.0)

## 2022-04-28 MED ORDER — VALSARTAN 80 MG PO TABS: 80.0000 mg | ORAL_TABLET | Freq: Every day | ORAL | 1 refills | Status: DC

## 2022-04-28 MED ORDER — CETIRIZINE HCL 10 MG PO TABS: 10.0000 mg | ORAL_TABLET | Freq: Every day | ORAL | 4 refills | Status: DC

## 2022-04-28 MED ORDER — AZELASTINE HCL 0.1 % NA SOLN: 1.0000 | Freq: Two times a day (BID) | NASAL | 1 refills | Status: DC

## 2022-04-28 NOTE — Progress Notes (Signed)
Subjective:   By signing my name below, I, [name], attest that this documentation has been prepared under the direction and in the presence of [place], NP 04/28/2022     Patient ID: Nancy Deleon, female    DOB: 11-09-1955, 66 y.o.   MRN: [id number]  Chief Complaint  Patient presents with   Diabetes    Here for follow up   Hypertension    Here for follow up    HPI Patient is in today for an office visit  Refills: She is requesting a refill of 0.1% of Astelin, 10 Mg of Zyrtec and 80 Mg of Valsartan.  Blood Pressure: As of today's visit, her blood pressure is normal. She is currently taking 10 Mg of Amlodipine and 80 Mg of Valsartan BP Readings from Last 3 Encounters:  04/28/22 (!) 128/55  01/03/22 (!) 131/58  12/19/21 126/82   Pulse Readings from Last 3 Encounters:  04/28/22 72  01/03/22 (!) 57  12/19/21 66   Iron: She is not taking iron supplements at this moment. She is taking Education officer, museum tablet Allergies: She is currently taking 0.1 % of Astelin and 10 Mg of Zyrtec. She is also taking OTC Allegra. She has tried Flonase in the past but reports of side effects.  Cholesterol: She is currently taking 10 Mg of Atorvastatin. Lab Results  Component Value Date   CHOL 133 03/23/2021   HDL 54.30 03/23/2021   LDLCALC 65 03/23/2021   TRIG 70.0 03/23/2021   CHOLHDL 2 03/23/2021   A1C: Her A1C levels are increasing. She is regularly walking.  Lab Results  Component Value Date   HGBA1C 7.0 (H) 01/03/2022   Vision: She is UTD on vision exams.   Health Maintenance Due  Topic Date Due   COVID-19 Vaccine (5 - Pfizer risk series) 02/18/2021   Diabetic kidney evaluation - Urine ACR  12/24/2021   OPHTHALMOLOGY EXAM  02/23/2022    Past Medical History:  Diagnosis Date   Allergy    Fatty liver 04/03/2014   GERD (gastroesophageal reflux disease)    Hypertension    Intraductal papilloma of breast 08/15/2017   Multiple thyroid nodules  08/20/2017   Obesity    Type 2 diabetes mellitus, controlled, with renal complications ([place]) 11/06/7406   12/18/19-pt states she does not have diabetes, just checks blood sugar as needed.    Past Surgical History:  Procedure Laterality Date   BIOPSY THYROID     BREAST BIOPSY  04/24/11   [facility] breast   BREAST EXCISIONAL BIOPSY     BREAST LUMPECTOMY WITH RADIOACTIVE SEED LOCALIZATION Left 05/01/2017   Procedure: LEFT BREAST LUMPECTOMY WITH RADIOACTIVE SEED LOCALIZATION;  Surgeon: Donnie Mesa, MD;  Location: [facility];  Service: General;  Laterality: Left;   CESAREAN SECTION  [id number]   COLONOSCOPY  2011   [facility]-  normal   toenail removal  09/2020   Dr. Cannon Kettle   TUBAL LIGATION     UPPER GASTROINTESTINAL ENDOSCOPY      Family History  Problem Relation Age of Onset   Lymphoma Mother    Dementia Mother    Cancer Father        lung   Colon cancer Maternal Uncle    Esophageal cancer Neg Hx    Pancreatic cancer Neg Hx    Rectal cancer Neg Hx    Stomach cancer Neg Hx    Thyroid disease Neg Hx    Colon polyps Neg Hx  Social History   Socioeconomic History   Marital status: Widowed    Spouse name: Not on file   Number of children: 2   Years of education: Not on file   Highest education level: Not on file  Occupational History    Employer: [employer]  Tobacco Use   Smoking status: Never   Smokeless tobacco: Never  Vaping Use   Vaping Use: Never used  Substance and Sexual Activity   Alcohol use: Yes    Alcohol/week: 0.0 standard drinks of alcohol    Comment: occasional wine   Drug use: No   Sexual activity: Not Currently    Partners: Male    Birth control/protection: Post-menopausal    Comment: 1st intercourse 66 yo-Fewer than 5 partners  Other Topics Concern   Not on file  Social History Narrative   Regular exercise:  Walks 2-5 x weekly   Caffeine Use: no   Widowed   She has a son [name]- he is 15 yrs old.   Foster parent.   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   Dog (Lab named Abby)         Social Determinants of Health   Financial Resource Strain: Not on file  Food Insecurity: Not on file  Transportation Needs: Not on file  Physical Activity: Not on file  Stress: Not on file  Social Connections: Not on file  Intimate Partner Violence: Not on file    Outpatient Medications Prior to Visit  Medication Sig Dispense Refill   acetaminophen (TYLENOL) 500 MG tablet Take 1,000 mg by mouth every 6 (six) hours as needed.     amLODipine (NORVASC) 10 MG tablet TAKE 1 TABLET DAILY 90 tablet 1   atorvastatin (LIPITOR) 10 MG tablet TAKE 1 TABLET DAILY 90 tablet 3   betamethasone dipropionate 0.05 % cream Apply topically 2 (two) times daily as needed. 30 g 0   blood glucose meter kit and supplies KIT Dispense based on patient and insurance preference. Check sugar 1-2 times daily.  (FOR ICD-9 250.00, 250.) 1 each 0   calcium-vitamin D (OSCAL WITH D) 500-200 MG-UNIT tablet Take 1 tablet by mouth.     Multiple Vitamins-Minerals (CENTRUM ULTRA WOMENS) TABS Take 1 tablet by mouth daily.     azelastine (ASTELIN) 0.1 % nasal spray Place 1 spray into both nostrils 2 (two) times daily. Use in each nostril as directed 30 mL 3   cetirizine (ZYRTEC) 10 MG tablet Take 1 tablet (10 mg total) by mouth daily. 30 tablet 11   valsartan (DIOVAN) 80 MG tablet Take 1 tablet (80 mg total) by mouth daily. 30 tablet 3   Facility-Administered Medications Prior to Visit  Medication Dose Route Frequency Provider Last Rate Last Admin   0.9 %  sodium chloride infusion  500 mL Intravenous Once Ladene Artist, MD        Allergies  Allergen Reactions   Lisinopril     cough   Metformin And Related Other (See Comments)    nausea   Naproxen Nausea And Vomiting    REACTION: vomiting   Ropinirole Nausea Only   Valsartan     Cough    Vicodin [Hydrocodone-Acetaminophen] Nausea And  Vomiting   Propoxyphene N-Acetaminophen Nausea And Vomiting    ROS    See HPI Objective:    Physical Exam Constitutional:      General: She is not in acute  distress.    Appearance: Normal appearance. She is not ill-appearing.  HENT:     Head: Normocephalic and atraumatic.     Right Ear: External ear normal.     Left Ear: External ear normal.  Eyes:     Extraocular Movements: Extraocular movements intact.     Pupils: Pupils are equal, round, and reactive to light.  Cardiovascular:     Rate and Rhythm: Normal rate and regular rhythm.     Heart sounds: Normal heart sounds. No murmur heard.    No gallop.  Pulmonary:     Effort: Pulmonary effort is normal. No respiratory distress.     Breath sounds: Normal breath sounds. No wheezing or rales.  Skin:    General: Skin is warm and dry.  Neurological:     Mental Status: She is alert and oriented to person, place, and time.  Psychiatric:        Mood and Affect: Mood normal.        Behavior: Behavior normal.        Judgment: Judgment normal.     BP (!) 128/55 (BP Location: Right Arm, Patient Position: Sitting, Cuff Size: Large)   Pulse 72   Temp 98.1 F (36.7 C) (Oral)   Resp 16   Wt 222 lb (100.7 kg)   LMP 02/28/2007   SpO2 97%   BMI 34.77 kg/m  Wt Readings from Last 3 Encounters:  04/28/22 222 lb (100.7 kg)  01/03/22 225 lb (102.1 kg)  12/19/21 223 lb (101.2 kg)       Assessment & Plan:   Problem List Items Addressed This Visit       Unprioritized   Type 2 diabetes mellitus, controlled, with renal complications ([facility])    Wt Readings from Last 3 Encounters:  04/28/22 222 lb (100.7 kg)  01/03/22 225 lb (102.1 kg)  12/19/21 223 lb (101.2 kg)   Lab Results  Component Value Date   HGBA1C 7.0 (H) 01/03/2022   HGBA1C 6.6 (H) 09/21/2021   HGBA1C 6.9 (H) 06/20/2021   Lab Results  Component Value Date   MICROALBUR 3.6 (H) 12/24/2020   LDLCALC 65 03/23/2021   CREATININE 0.85 01/03/2022  A1C was up a little  last visit.  Obtain follow up A1C. Continue diabetic diet/exercise/weight loss efforts.       Relevant Medications   valsartan (DIOVAN) 80 MG tablet   Other Relevant Orders   Comp Met (CMET)   Hemoglobin A1c   Urine Microalbumin w/creat. ratio   Microalbuminuria   Relevant Medications   valsartan (DIOVAN) 80 MG tablet   Hyperlipidemia    Lab Results  Component Value Date   CHOL 133 03/23/2021   HDL 54.30 03/23/2021   LDLCALC 65 03/23/2021   TRIG 70.0 03/23/2021   CHOLHDL 2 03/23/2021  Tolerating atorvastatin, continue same.       Relevant Medications   valsartan (DIOVAN) 80 MG tablet   Other Relevant Orders   Lipid panel   Essential hypertension    BP Readings from Last 3 Encounters:  04/28/22 (!) 128/55  01/03/22 (!) 131/58  12/19/21 126/82  Stable on amlodipine/valsartan. Continue same.       Relevant Medications   valsartan (DIOVAN) 80 MG tablet   ANEMIA, IRON DEFICIENCY    Lab Results  Component Value Date   WBC 5.4 01/03/2022   HGB 12.7 01/03/2022   HCT 37.7 01/03/2022   MCV 74.5 (L) 01/03/2022   PLT 154.0 01/03/2022  Stable.  Not on iron supplements  but continues multivitamin with Minerals.       Allergic rhinitis    Fair control on astelin and zyrtec.  She is advised to avoid zyrtec D as it may raise her blood pressure.       Relevant Medications   cetirizine (ZYRTEC) 10 MG tablet   azelastine (ASTELIN) 0.1 % nasal spray   Other Visit Diagnoses     Needs flu shot    -  Primary   Relevant Orders   Flu Vaccine QUAD High Dose(Fluad) (Completed)      Meds ordered this encounter  Medications   valsartan (DIOVAN) 80 MG tablet    Sig: Take 1 tablet (80 mg total) by mouth daily.    Dispense:  90 tablet    Refill:  1    Order Specific Question:   Supervising Provider    Answer:   Penni Homans A [[id number]]   cetirizine (ZYRTEC) 10 MG tablet    Sig: Take 1 tablet (10 mg total) by mouth daily.    Dispense:  90 tablet    Refill:  4    Order Specific  Question:   Supervising Provider    Answer:   Penni Homans A [[id number]]   azelastine (ASTELIN) 0.1 % nasal spray    Sig: Place 1 spray into both nostrils 2 (two) times daily. Use in each nostril as directed    Dispense:  90 mL    Refill:  1    Order Specific Question:   Supervising Provider    Answer:   Penni Homans A [[id number]]    I, Nance Pear, NP, personally preformed the services described in this documentation.  All medical record entries made by the scribe were at my direction and in my presence.  I have reviewed the chart and discharge instructions (if applicable) and agree that the record reflects my personal performance and is accurate and complete. 04/28/2022  I,Amber Collins,acting as a scribe for Nance Pear, NP.,have documented all relevant documentation on the behalf of Nance Pear, NP,as directed by  Nance Pear, NP while in the presence of Nance Pear, NP.  Nance Pear, NP

## 2022-04-28 NOTE — Assessment & Plan Note (Signed)
>>  ASSESSMENT AND PLAN FOR ALLERGIC RHINITIS WRITTEN ON 04/28/2022  9:45 AM BY O'SULLIVAN, Nancy Feijoo, NP  Fair control on astelin  and zyrtec .  She is advised to avoid zyrtec  D as it may raise her blood pressure.

## 2022-04-28 NOTE — Telephone Encounter (Signed)
Please call [facility] and request DM eye exam.

## 2022-04-28 NOTE — Assessment & Plan Note (Signed)
BP Readings from Last 3 Encounters:  04/28/22 (!) 128/55  01/03/22 (!) 131/58  12/19/21 126/82   Stable on amlodipine/valsartan. Continue same.

## 2022-04-28 NOTE — Assessment & Plan Note (Addendum)
Wt Readings from Last 3 Encounters:  04/28/22 222 lb (100.7 kg)  01/03/22 225 lb (102.1 kg)  12/19/21 223 lb (101.2 kg)   Lab Results  Component Value Date   HGBA1C 7.0 (H) 01/03/2022   HGBA1C 6.6 (H) 09/21/2021   HGBA1C 6.9 (H) 06/20/2021   Lab Results  Component Value Date   MICROALBUR 3.6 (H) 12/24/2020   LDLCALC 65 03/23/2021   CREATININE 0.85 01/03/2022   A1C was up a little last visit.  Obtain follow up A1C. Continue diabetic diet/exercise/weight loss efforts.

## 2022-04-28 NOTE — Assessment & Plan Note (Signed)
Lab Results  Component Value Date   WBC 5.4 01/03/2022   HGB 12.7 01/03/2022   HCT 37.7 01/03/2022   MCV 74.5 (L) 01/03/2022   PLT 154.0 01/03/2022   Stable.  Not on iron supplements but continues multivitamin with Minerals.

## 2022-04-28 NOTE — Telephone Encounter (Signed)
Records release will be faxed

## 2022-04-28 NOTE — Assessment & Plan Note (Addendum)
Fair control on astelin and zyrtec.  She is advised to avoid zyrtec D as it may raise her blood pressure.

## 2022-04-28 NOTE — Assessment & Plan Note (Signed)
Lab Results  Component Value Date   CHOL 133 03/23/2021   HDL 54.30 03/23/2021   LDLCALC 65 03/23/2021   TRIG 70.0 03/23/2021   CHOLHDL 2 03/23/2021   Tolerating atorvastatin, continue same.

## 2022-05-01 ENCOUNTER — Encounter: Payer: Self-pay | Admitting: Family

## 2022-05-01 LAB — HEMOGLOBIN A1C
Hgb A1c MFr Bld: 6.9 % — ABNORMAL HIGH (ref 4.6–6.5)
Hgb A1c MFr Bld: 7 % — ABNORMAL HIGH (ref 4.6–6.5)

## 2022-05-02 ENCOUNTER — Telehealth: Payer: Self-pay | Admitting: Family

## 2022-05-02 MED ORDER — SITAGLIPTIN PHOSPHATE 50 MG PO TABS: 50.0000 mg | ORAL_TABLET | Freq: Every day | ORAL | 3 refills | Status: DC

## 2022-05-02 NOTE — Telephone Encounter (Signed)
A1C is at 7.0. I would like to see it less than 7.  I would recommend that she start [place] '50mg'$  once daily and continue to work on diet/exercise.

## 2022-05-02 NOTE — Telephone Encounter (Signed)
Called but no answer and no voice mail

## 2022-05-03 NOTE — Telephone Encounter (Signed)
Patient advised of results, provider's comments and new medication dose

## 2022-06-12 ENCOUNTER — Other Ambulatory Visit: Payer: Self-pay | Admitting: Family

## 2022-06-20 ENCOUNTER — Other Ambulatory Visit: Payer: Self-pay | Admitting: Family

## 2022-06-20 MED ORDER — BETAMETHASONE DIPROPIONATE 0.05 % EX CREA
TOPICAL_CREAM | Freq: Two times a day (BID) | CUTANEOUS | 0 refills | Status: DC | PRN
  Filled 2022-06-20: qty 30, 30d supply, fill #0

## 2022-06-21 ENCOUNTER — Other Ambulatory Visit (HOSPITAL_COMMUNITY): Payer: Self-pay

## 2022-06-30 ENCOUNTER — Other Ambulatory Visit (HOSPITAL_COMMUNITY): Payer: Self-pay

## 2022-07-11 DIAGNOSIS — H43812 Vitreous degeneration, left eye: Secondary | ICD-10-CM | POA: Diagnosis not present

## 2022-07-26 ENCOUNTER — Ambulatory Visit (INDEPENDENT_AMBULATORY_CARE_PROVIDER_SITE_OTHER): Payer: Medicare PPO | Admitting: Family

## 2022-07-26 ENCOUNTER — Telehealth: Payer: Self-pay | Admitting: Family

## 2022-07-26 ENCOUNTER — Encounter: Payer: Self-pay | Admitting: Family

## 2022-07-26 VITALS — BP 135/50 | HR 61 | Temp 98.1°F | Resp 16 | Wt 225.0 lb

## 2022-07-26 DIAGNOSIS — R809 Proteinuria, unspecified: Secondary | ICD-10-CM

## 2022-07-26 DIAGNOSIS — D509 Iron deficiency anemia, unspecified: Secondary | ICD-10-CM

## 2022-07-26 DIAGNOSIS — E785 Hyperlipidemia, unspecified: Secondary | ICD-10-CM

## 2022-07-26 DIAGNOSIS — I10 Essential (primary) hypertension: Secondary | ICD-10-CM

## 2022-07-26 DIAGNOSIS — I071 Rheumatic tricuspid insufficiency: Secondary | ICD-10-CM

## 2022-07-26 DIAGNOSIS — E1129 Type 2 diabetes mellitus with other diabetic kidney complication: Secondary | ICD-10-CM | POA: Diagnosis not present

## 2022-07-26 MED ORDER — BETAMETHASONE DIPROPIONATE 0.05 % EX CREA: TOPICAL_CREAM | Freq: Two times a day (BID) | CUTANEOUS | 2 refills | Status: DC | PRN

## 2022-07-26 MED ORDER — ATORVASTATIN CALCIUM 10 MG PO TABS: 10.0000 mg | ORAL_TABLET | Freq: Every day | ORAL | 1 refills | Status: DC

## 2022-07-26 MED ORDER — SITAGLIPTIN PHOSPHATE 50 MG PO TABS: 50.0000 mg | ORAL_TABLET | Freq: Every day | ORAL | 1 refills | Status: DC

## 2022-07-26 MED ORDER — AMLODIPINE BESYLATE 10 MG PO TABS: 10.0000 mg | ORAL_TABLET | Freq: Every day | ORAL | 1 refills | Status: DC

## 2022-07-26 MED ORDER — VALSARTAN 80 MG PO TABS: 80.0000 mg | ORAL_TABLET | Freq: Every day | ORAL | 1 refills | Status: DC

## 2022-07-26 NOTE — Assessment & Plan Note (Signed)
LDL at goal. Continue lipitor.

## 2022-07-26 NOTE — Assessment & Plan Note (Signed)
Mild to moderate on 2D echo- clinically compensated. Monitor.

## 2022-07-26 NOTE — Telephone Encounter (Signed)
Request sent for eye exam

## 2022-07-26 NOTE — Assessment & Plan Note (Signed)
Lab Results  Component Value Date   WBC 5.4 01/03/2022   HGB 12.7 01/03/2022   HCT 37.7 01/03/2022   MCV 74.5 (L) 01/03/2022   PLT 154.0 01/03/2022

## 2022-07-26 NOTE — Assessment & Plan Note (Signed)
Control unknown as she is not checking at home and has not been taking [place]. Will check A1C, restart januvia as tolerated.

## 2022-07-26 NOTE — Telephone Encounter (Signed)
Please call [facility] and request copy of last DM eye exam.

## 2022-07-26 NOTE — Progress Notes (Signed)
Subjective:     Patient ID: Nancy Deleon, female    DOB: 17-Apr-1956, 66 y.o.   MRN: [id number]  Chief Complaint  Patient presents with   Diabetes    Here for follow up    Diabetes    DM2- pt states she did not take [place] due to nausea but is willing to retry with food.   HTN-  BP Readings from Last 3 Encounters:  07/26/22 (!) 135/50  04/28/22 (!) 128/55  01/03/22 (!) 131/58   Hyperlipidemia-  Lab Results  Component Value Date   CHOL 137 04/28/2022   HDL 59.30 04/28/2022   LDLCALC 63 04/28/2022   TRIG 74.0 04/28/2022   CHOLHDL 2 04/28/2022    Health Maintenance Due  Topic Date Due   Medicare Annual Wellness (AWV)  Never done   OPHTHALMOLOGY EXAM  02/23/2022   COVID-19 Vaccine (5 - 2023-24 season) 04/07/2022    Past Medical History:  Diagnosis Date   Allergy    Fatty liver 04/03/2014   GERD (gastroesophageal reflux disease)    Hypertension    Intraductal papilloma of breast 08/15/2017   Multiple thyroid nodules 08/20/2017   Obesity    Type 2 diabetes mellitus, controlled, with renal complications ([place]) 01/12/1244   12/18/19-pt states she does not have diabetes, just checks blood sugar as needed.    Past Surgical History:  Procedure Laterality Date   BIOPSY THYROID     BREAST BIOPSY  04/24/11   [facility] breast   BREAST EXCISIONAL BIOPSY     BREAST LUMPECTOMY WITH RADIOACTIVE SEED LOCALIZATION Left 05/01/2017   Procedure: LEFT BREAST LUMPECTOMY WITH RADIOACTIVE SEED LOCALIZATION;  Surgeon: Donnie Mesa, MD;  Location: [facility];  Service: General;  Laterality: Left;   CESAREAN SECTION  [id number]   COLONOSCOPY  2011   [facility]-  normal   toenail removal  09/2020   Dr. Cannon Kettle   TUBAL LIGATION     UPPER GASTROINTESTINAL ENDOSCOPY      Family History  Problem Relation Age of Onset   Lymphoma Mother    Dementia Mother    Cancer Father        lung   Colon cancer Maternal Uncle    Esophageal cancer Neg Hx    Pancreatic cancer  Neg Hx    Rectal cancer Neg Hx    Stomach cancer Neg Hx    Thyroid disease Neg Hx    Colon polyps Neg Hx     Social History   Socioeconomic History   Marital status: Widowed    Spouse name: Not on file   Number of children: 2   Years of education: Not on file   Highest education level: Not on file  Occupational History    Employer: [employer]  Tobacco Use   Smoking status: Never   Smokeless tobacco: Never  Vaping Use   Vaping Use: Never used  Substance and Sexual Activity   Alcohol use: Yes    Alcohol/week: 0.0 standard drinks of alcohol    Comment: occasional wine   Drug use: No   Sexual activity: Not Currently    Partners: Male    Birth control/protection: Post-menopausal    Comment: 1st intercourse 66 yo-Fewer than 5 partners  Other Topics Concern   Not on file  Social History Narrative   Regular exercise:  Walks 2-5 x weekly   Caffeine Use: no   Widowed   She has a son Nancy Deleon- he is 17 yrs old.  Foster parent.   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   Dog (Lab named Abby)         Social Determinants of Health   Financial Resource Strain: Not on file  Food Insecurity: Not on file  Transportation Needs: Not on file  Physical Activity: Not on file  Stress: Not on file  Social Connections: Not on file  Intimate Partner Violence: Not on file    Outpatient Medications Prior to Visit  Medication Sig Dispense Refill   acetaminophen (TYLENOL) 500 MG tablet Take 1,000 mg by mouth every 6 (six) hours as needed.     azelastine (ASTELIN) 0.1 % nasal spray Place 1 spray into both nostrils 2 (two) times daily. Use in each nostril as directed 90 mL 1   calcium-vitamin D (OSCAL WITH D) 500-200 MG-UNIT tablet Take 1 tablet by mouth.     cetirizine (ZYRTEC) 10 MG tablet Take 1 tablet (10 mg total) by mouth daily. 90 tablet 4   Multiple Vitamins-Minerals (CENTRUM ULTRA WOMENS) TABS Take 1 tablet by mouth  daily.     amLODipine (NORVASC) 10 MG tablet TAKE 1 TABLET DAILY 90 tablet 1   atorvastatin (LIPITOR) 10 MG tablet TAKE 1 TABLET DAILY 90 tablet 3   betamethasone dipropionate 0.05 % cream Apply topically 2 (two) times daily as needed. 30 g 0   blood glucose meter kit and supplies KIT Dispense based on patient and insurance preference. Check sugar 1-2 times daily.  (FOR ICD-9 250.00, 250.) 1 each 0   sitaGLIPtin (JANUVIA) 50 MG tablet Take 1 tablet (50 mg total) by mouth daily. 30 tablet 3   valsartan (DIOVAN) 80 MG tablet Take 1 tablet (80 mg total) by mouth daily. 90 tablet 1   0.9 %  sodium chloride infusion      No facility-administered medications prior to visit.    Allergies  Allergen Reactions   Lisinopril     cough   Metformin And Related Other (See Comments)    nausea   Naproxen Nausea And Vomiting    REACTION: vomiting   Ropinirole Nausea Only   Valsartan     Cough    Vicodin [Hydrocodone-Acetaminophen] Nausea And Vomiting   Propoxyphene N-Acetaminophen Nausea And Vomiting    ROS     Objective:    Physical Exam Constitutional:      General: She is not in acute distress.    Appearance: Normal appearance. She is well-developed.  HENT:     Head: Normocephalic and atraumatic.     Right Ear: External ear normal.     Left Ear: External ear normal.  Eyes:     General: No scleral icterus. Neck:     Thyroid: No thyromegaly.  Cardiovascular:     Rate and Rhythm: Normal rate and regular rhythm.     Heart sounds: Murmur heard.     Systolic murmur is present with a grade of 3/6.     Crescendo diastolic murmur is present.  Pulmonary:     Effort: Pulmonary effort is normal. No respiratory distress.     Breath sounds: Normal breath sounds. No wheezing.  Musculoskeletal:     Cervical back: Neck supple.  Skin:    General: Skin is warm and dry.  Neurological:     Mental Status: She is alert and oriented to person, place, and time.  Psychiatric:        Mood and  Affect:  Mood normal.        Behavior: Behavior normal.        Thought Content: Thought content normal.        Judgment: Judgment normal.     BP (!) 135/50 (BP Location: Right Arm, Patient Position: Sitting, Cuff Size: Large)   Pulse 61   Temp 98.1 F (36.7 C) (Oral)   Resp 16   Wt 225 lb (102.1 kg)   LMP 02/28/2007   SpO2 98%   BMI 35.24 kg/m  Wt Readings from Last 3 Encounters:  07/26/22 225 lb (102.1 kg)  04/28/22 222 lb (100.7 kg)  01/03/22 225 lb (102.1 kg)       Assessment & Plan:   Problem List Items Addressed This Visit       Unprioritized   Type 2 diabetes mellitus, controlled, with renal complications ([place]) - Primary    Control unknown as she is not checking at home and has not been taking [place]. Will check A1C, restart januvia as tolerated.       Relevant Medications   sitaGLIPtin (JANUVIA) 50 MG tablet   valsartan (DIOVAN) 80 MG tablet   atorvastatin (LIPITOR) 10 MG tablet   Other Relevant Orders   HgB B5Z   Basic Metabolic Panel (BMET)   Tricuspid regurgitation    Mild to moderate on 2D echo- clinically compensated. Monitor.       Relevant Medications   valsartan (DIOVAN) 80 MG tablet   atorvastatin (LIPITOR) 10 MG tablet   amLODipine (NORVASC) 10 MG tablet   Microalbuminuria    Continue ARB for renal protection.       Relevant Medications   valsartan (DIOVAN) 80 MG tablet   Hyperlipidemia    LDL at goal. Continue lipitor.       Relevant Medications   valsartan (DIOVAN) 80 MG tablet   atorvastatin (LIPITOR) 10 MG tablet   amLODipine (NORVASC) 10 MG tablet   Essential hypertension   Relevant Medications   valsartan (DIOVAN) 80 MG tablet   atorvastatin (LIPITOR) 10 MG tablet   amLODipine (NORVASC) 10 MG tablet   ANEMIA, IRON DEFICIENCY    Lab Results  Component Value Date   WBC 5.4 01/03/2022   HGB 12.7 01/03/2022   HCT 37.7 01/03/2022   MCV 74.5 (L) 01/03/2022   PLT 154.0 01/03/2022         I have discontinued Raya Mckinstry.  Carlisi's blood glucose meter kit and supplies. I have also changed her atorvastatin and amLODipine. Additionally, I am having her maintain her calcium-vitamin D, Centrum Ultra Womens, acetaminophen, cetirizine, azelastine, sitaGLIPtin, betamethasone dipropionate, and valsartan. We will stop administering sodium chloride.  Meds ordered this encounter  Medications   sitaGLIPtin (JANUVIA) 50 MG tablet    Sig: Take 1 tablet (50 mg total) by mouth daily.    Dispense:  90 tablet    Refill:  1    Order Specific Question:   Supervising Provider    Answer:   Penni Homans A [[id number]]   betamethasone dipropionate 0.05 % cream    Sig: Apply topically 2 (two) times daily as needed.    Dispense:  30 g    Refill:  2    Order Specific Question:   Supervising Provider    Answer:   Penni Homans A [[id number]]   valsartan (DIOVAN) 80 MG tablet    Sig: Take 1 tablet (80 mg total) by mouth daily.    Dispense:  90 tablet    Refill:  1  Order Specific Question:   Supervising Provider    Answer:   Penni Homans A [[id number]]   atorvastatin (LIPITOR) 10 MG tablet    Sig: Take 1 tablet (10 mg total) by mouth daily.    Dispense:  90 tablet    Refill:  1    Order Specific Question:   Supervising Provider    Answer:   Penni Homans A [[id number]]   amLODipine (NORVASC) 10 MG tablet    Sig: Take 1 tablet (10 mg total) by mouth daily.    Dispense:  90 tablet    Refill:  1    Order Specific Question:   Supervising Provider    Answer:   Penni Homans A [[id number]]

## 2022-07-26 NOTE — Assessment & Plan Note (Signed)
Continue ARB for renal protection.

## 2022-08-02 ENCOUNTER — Other Ambulatory Visit (INDEPENDENT_AMBULATORY_CARE_PROVIDER_SITE_OTHER): Payer: Medicare PPO

## 2022-08-02 DIAGNOSIS — R809 Proteinuria, unspecified: Secondary | ICD-10-CM | POA: Diagnosis not present

## 2022-08-02 DIAGNOSIS — D509 Iron deficiency anemia, unspecified: Secondary | ICD-10-CM

## 2022-08-02 DIAGNOSIS — E1129 Type 2 diabetes mellitus with other diabetic kidney complication: Secondary | ICD-10-CM

## 2022-08-02 LAB — BASIC METABOLIC PANEL
BUN: 11 mg/dL (ref 6–23)
CO2: 29 mEq/L (ref 19–32)
Calcium: 9.5 mg/dL (ref 8.4–10.5)
Chloride: 104 mEq/L (ref 96–112)
Creatinine, Ser: 0.89 mg/dL (ref 0.40–1.20)
GFR: 67.88 mL/min (ref >60.00)
Glucose, Bld: 184 mg/dL — ABNORMAL HIGH (ref 70–99)
Potassium: 3.7 mEq/L (ref 3.5–5.1)
Sodium: 141 mEq/L (ref 135–145)

## 2022-08-02 LAB — CBC WITH DIFFERENTIAL/PLATELET
Basophils Absolute: 0.1 10*3/uL (ref 0.0–0.1)
Basophils Relative: 1.3 % (ref 0.0–3.0)
Eosinophils Absolute: 0.2 10*3/uL (ref 0.0–0.7)
Eosinophils Relative: 4.2 % (ref 0.0–5.0)
HCT: 38 % (ref 36.0–46.0)
Hemoglobin: 13 g/dL (ref 12.0–15.0)
Lymphocytes Relative: 37.1 % (ref 12.0–46.0)
Lymphs Abs: 2.1 10*3/uL (ref 0.7–4.0)
MCHC: 34.2 g/dL (ref 30.0–36.0)
MCV: 74.4 fl — ABNORMAL LOW (ref 78.0–100.0)
Monocytes Absolute: 0.3 10*3/uL (ref 0.1–1.0)
Monocytes Relative: 6 % (ref 3.0–12.0)
Neutro Abs: 2.9 10*3/uL (ref 1.4–7.7)
Neutrophils Relative %: 51.4 % (ref 43.0–77.0)
Platelets: 181 10*3/uL (ref 150.0–400.0)
RBC: 5.1 Mil/uL (ref 3.87–5.11)
RDW: 13.6 % (ref 11.5–15.5)
WBC: 5.6 10*3/uL (ref 4.0–10.5)

## 2022-08-02 LAB — HEMOGLOBIN A1C: Hgb A1c MFr Bld: 7.3 % — ABNORMAL HIGH (ref 4.6–6.5)

## 2022-08-17 DIAGNOSIS — H43812 Vitreous degeneration, left eye: Secondary | ICD-10-CM | POA: Diagnosis not present

## 2022-08-17 DIAGNOSIS — H34232 Retinal artery branch occlusion, left eye: Secondary | ICD-10-CM | POA: Diagnosis not present

## 2022-08-18 ENCOUNTER — Ambulatory Visit (INDEPENDENT_AMBULATORY_CARE_PROVIDER_SITE_OTHER): Payer: Medicare PPO | Admitting: Family

## 2022-08-18 ENCOUNTER — Ambulatory Visit (HOSPITAL_BASED_OUTPATIENT_CLINIC_OR_DEPARTMENT_OTHER)
Admission: RE | Admit: 2022-08-18 | Discharge: 2022-08-18 | Disposition: A | Discharge status: Home / Self Care | Payer: Medicare PPO | Source: Ambulatory Visit | Attending: Family | Admitting: Family

## 2022-08-18 VITALS — BP 128/58 | HR 71 | Temp 98.1°F | Resp 16 | Wt 225.0 lb

## 2022-08-18 DIAGNOSIS — E041 Nontoxic single thyroid nodule: Secondary | ICD-10-CM | POA: Diagnosis not present

## 2022-08-18 DIAGNOSIS — E785 Hyperlipidemia, unspecified: Secondary | ICD-10-CM

## 2022-08-18 DIAGNOSIS — I10 Essential (primary) hypertension: Secondary | ICD-10-CM

## 2022-08-18 DIAGNOSIS — R809 Proteinuria, unspecified: Secondary | ICD-10-CM | POA: Diagnosis not present

## 2022-08-18 DIAGNOSIS — H34232 Retinal artery branch occlusion, left eye: Secondary | ICD-10-CM

## 2022-08-18 DIAGNOSIS — H349 Unspecified retinal vascular occlusion: Secondary | ICD-10-CM | POA: Diagnosis not present

## 2022-08-18 DIAGNOSIS — E1129 Type 2 diabetes mellitus with other diabetic kidney complication: Secondary | ICD-10-CM

## 2022-08-18 DIAGNOSIS — I6523 Occlusion and stenosis of bilateral carotid arteries: Secondary | ICD-10-CM | POA: Diagnosis not present

## 2022-08-18 MED ORDER — ASPIRIN 81 MG PO TBEC: 81.0000 mg | DELAYED_RELEASE_TABLET | Freq: Every day | ORAL | 12 refills | Status: AC

## 2022-08-18 NOTE — Assessment & Plan Note (Addendum)
BP Readings from Last 3 Encounters:  08/18/22 (!) 128/58  07/26/22 (!) 135/50  04/28/22 (!) 128/55   BP at goal. Continue diovan and amlodipine.

## 2022-08-18 NOTE — Progress Notes (Signed)
Subjective:     Patient ID: Nancy Deleon, female    DOB: [birth year], 67 y.o.   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  This visit was a follow up appointment for acute vitreous retinal detachment.  Incidental finding was made of Branch retinal occlusion left eye.  It was recommended that the patient come to see [place] for additional evaluation including carotid study and echo. She is accompanied by her son who flew in from [place] to be with her.   Health Maintenance Due  Topic Date Due   Medicare Annual Wellness (AWV)  Never done   OPHTHALMOLOGY EXAM  02/23/2022   COVID-19 Vaccine (5 - 2023-24 season) 04/07/2022    Past Medical History:  Diagnosis Date   Allergy    Fatty liver 04/03/2014   GERD (gastroesophageal reflux disease)    Hypertension    Intraductal papilloma of breast 08/15/2017   Multiple thyroid nodules 08/20/2017   Obesity    Type 2 diabetes mellitus, controlled, with renal complications ([place]) 11/05/6604   12/18/19-pt states she does not have diabetes, just checks blood sugar as needed.    Past Surgical History:  Procedure Laterality Date   BIOPSY THYROID     BREAST BIOPSY  04/24/11   [facility]   BREAST EXCISIONAL BIOPSY     BREAST LUMPECTOMY WITH RADIOACTIVE SEED LOCALIZATION Left 05/01/2017   Procedure: LEFT BREAST LUMPECTOMY WITH RADIOACTIVE SEED LOCALIZATION;  Surgeon: Donnie Mesa, MD;  Location: [facility];  Service: General;  Laterality: Left;   CESAREAN SECTION  [id number]   COLONOSCOPY  2011   [facility]-  normal   toenail removal  09/2020   Dr. Cannon Kettle   TUBAL LIGATION     UPPER GASTROINTESTINAL ENDOSCOPY      Family History  Problem Relation Age of Onset   Lymphoma Mother    Dementia Mother    Cancer Father        lung   Colon  cancer Maternal Uncle    Esophageal cancer Neg Hx    Pancreatic cancer Neg Hx    Rectal cancer Neg Hx    Stomach cancer Neg Hx    Thyroid disease Neg Hx    Colon polyps Neg Hx     Social History   Socioeconomic History   Marital status: Widowed    Spouse name: Not on file   Number of children: 2   Years of education: Not on file   Highest education level: Not on file  Occupational History    Employer: [employer]  Tobacco Use   Smoking status: Never   Smokeless tobacco: Never  Vaping Use   Vaping Use: Never used  Substance and Sexual Activity   Alcohol use: Yes    Alcohol/week: 0.0 standard drinks of alcohol    Comment: occasional wine   Drug use: No   Sexual activity: Not Currently    Partners: Male    Birth control/protection: Post-menopausal    Comment: 1st intercourse 67 yo-Fewer than 5 partners  Other Topics Concern   Not on file  Social History Narrative   Regular exercise:  Walks 2-5 x weekly   Caffeine Use: no   Widowed   She has a son [name]- he is 76 yrs old.   Foster parent.  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   Dog (Lab named Abby)         Social Determinants of Health   Financial Resource Strain: Not on file  Food Insecurity: Not on file  Transportation Needs: Not on file  Physical Activity: Not on file  Stress: Not on file  Social Connections: Not on file  Intimate Partner Violence: Not on file    Outpatient Medications Prior to Visit  Medication Sig Dispense Refill   acetaminophen (TYLENOL) 500 MG tablet Take 1,000 mg by mouth every 6 (six) hours as needed.     amLODipine (NORVASC) 10 MG tablet Take 1 tablet (10 mg total) by mouth daily. 90 tablet 1   atorvastatin (LIPITOR) 10 MG tablet Take 1 tablet (10 mg total) by mouth daily. 90 tablet 1   azelastine (ASTELIN) 0.1 % nasal spray Place 1 spray into both nostrils 2 (two) times daily. Use in each nostril as directed 90 mL 1    betamethasone dipropionate 0.05 % cream Apply topically 2 (two) times daily as needed. 30 g 2   calcium-vitamin D (OSCAL WITH D) 500-200 MG-UNIT tablet Take 1 tablet by mouth.     cetirizine (ZYRTEC) 10 MG tablet Take 1 tablet (10 mg total) by mouth daily. 90 tablet 4   Multiple Vitamins-Minerals (CENTRUM ULTRA WOMENS) TABS Take 1 tablet by mouth daily.     sitaGLIPtin (JANUVIA) 50 MG tablet Take 1 tablet (50 mg total) by mouth daily. 90 tablet 1   valsartan (DIOVAN) 80 MG tablet Take 1 tablet (80 mg total) by mouth daily. 90 tablet 1   No facility-administered medications prior to visit.    Allergies  Allergen Reactions   Lisinopril     cough   Metformin And Related Other (See Comments)    nausea   Naproxen Nausea And Vomiting    REACTION: vomiting   Ropinirole Nausea Only   Valsartan     Cough    Vicodin [Hydrocodone-Acetaminophen] Nausea And Vomiting   Propoxyphene N-Acetaminophen Nausea And Vomiting    Review of Systems  Eyes:  Negative for blurred vision.  Cardiovascular:  Negative for chest pain and palpitations.      Objective:    Physical Exam Constitutional:      General: She is not in acute distress.    Appearance: Normal appearance. She is well-developed.  HENT:     Head: Normocephalic and atraumatic.     Right Ear: External ear normal.     Left Ear: External ear normal.  Eyes:     General: No scleral icterus. Neck:     Thyroid: No thyromegaly.  Cardiovascular:     Rate and Rhythm: Normal rate and regular rhythm.     Heart sounds: Normal heart sounds. No murmur heard. Pulmonary:     Effort: Pulmonary effort is normal. No respiratory distress.     Breath sounds: Normal breath sounds. No wheezing.  Musculoskeletal:     Cervical back: Neck supple.  Skin:    General: Skin is warm and dry.  Neurological:     Mental Status: She is alert and oriented to person, place, and time.  Psychiatric:        Mood and Affect: Mood normal.        Behavior:  Behavior normal.        Thought Content: Thought content normal.        Judgment: Judgment normal.  BP (!) 128/58 (BP Location: Right Arm, Patient Position: Sitting, Cuff Size: Large)   Pulse 71   Temp 98.1 F (36.7 C) (Oral)   Resp 16   Wt 225 lb (102.1 kg)   LMP 02/28/2007   SpO2 99%   BMI 35.24 kg/m  Wt Readings from Last 3 Encounters:  08/18/22 225 lb (102.1 kg)  07/26/22 225 lb (102.1 kg)  04/28/22 222 lb (100.7 kg)       Assessment & Plan:   Problem List Items Addressed This Visit       Unprioritized   Type 2 diabetes mellitus, controlled, with renal complications ([place])    Lab Results  Component Value Date   HGBA1C 7.3 (H) 08/02/2022  We added [place] last visit which she is taking and hopefully this will lower A1c <7.       Relevant Medications   aspirin EC 81 MG tablet   Retinal artery occlusion, branch, left - Primary    New incidental finding. Denies any new visual deficits. We discussed importance of secondary prevention with strict bp and glycemic control, lipid management and continue avoidance of nicotine products.   Will obtain 2D echo and carotid doppler for further evaluation. Add aspirin '81mg'$  once daily for secondary prevention.       Relevant Medications   aspirin EC 81 MG tablet   Other Relevant Orders   EKG 12-Lead (Completed)   US Carotid Duplex Bilateral   ECHOCARDIOGRAM COMPLETE   Hyperlipidemia    LDL at goal of <70 for secondary stroke prevention.  Continue atorvastatin '81mg'$ .       Relevant Medications   aspirin EC 81 MG tablet   Essential hypertension    BP Readings from Last 3 Encounters:  08/18/22 (!) 128/58  07/26/22 (!) 135/50  04/28/22 (!) 128/55  BP at goal. Continue diovan and amlodipine.       Relevant Medications   aspirin EC 81 MG tablet    I am having Nancy Maple. Deleon start on aspirin EC. I am also having her maintain her calcium-vitamin D, Centrum Ultra Womens, acetaminophen, cetirizine, azelastine,  sitaGLIPtin, betamethasone dipropionate, valsartan, atorvastatin, and amLODipine.  Meds ordered this encounter  Medications   aspirin EC 81 MG tablet    Sig: Take 1 tablet (81 mg total) by mouth daily. Swallow whole.    Dispense:  30 tablet    Refill:  12    Order Specific Question:   Supervising Provider    Answer:   Penni Homans A [[id number]]

## 2022-08-18 NOTE — Assessment & Plan Note (Signed)
Lab Results  Component Value Date   HGBA1C 7.3 (H) 08/02/2022   We added [place] last visit which she is taking and hopefully this will lower A1c <7.

## 2022-08-18 NOTE — Assessment & Plan Note (Signed)
LDL at goal of <70 for secondary stroke prevention.  Continue atorvastatin '81mg'$ .

## 2022-08-18 NOTE — Patient Instructions (Signed)
Please add aspirin '81mg'$  once daily.

## 2022-08-18 NOTE — Assessment & Plan Note (Addendum)
New incidental finding. Denies any new visual deficits. We discussed importance of secondary prevention with strict bp and glycemic control, lipid management and continue avoidance of nicotine products. EKG is performed today and personally reviewed and notes NSR.   Will obtain 2D echo and carotid doppler for further evaluation. Add aspirin '81mg'$  once daily for secondary prevention.

## 2022-08-29 ENCOUNTER — Other Ambulatory Visit: Payer: Self-pay | Admitting: Family

## 2022-08-29 DIAGNOSIS — Z1231 Encounter for screening mammogram for malignant neoplasm of breast: Secondary | ICD-10-CM

## 2022-08-31 ENCOUNTER — Ambulatory Visit
Admission: RE | Admit: 2022-08-31 | Discharge: 2022-08-31 | Disposition: A | Discharge status: Home / Self Care | Payer: Medicare PPO | Source: Ambulatory Visit | Attending: Family | Admitting: Family

## 2022-08-31 DIAGNOSIS — Z1231 Encounter for screening mammogram for malignant neoplasm of breast: Secondary | ICD-10-CM | POA: Diagnosis not present

## 2022-09-06 ENCOUNTER — Ambulatory Visit (HOSPITAL_BASED_OUTPATIENT_CLINIC_OR_DEPARTMENT_OTHER)
Admission: RE | Admit: 2022-09-06 | Discharge: 2022-09-06 | Disposition: A | Discharge status: Home / Self Care | Payer: Medicare PPO | Source: Ambulatory Visit | Attending: Family | Admitting: Family

## 2022-09-06 DIAGNOSIS — H34232 Retinal artery branch occlusion, left eye: Secondary | ICD-10-CM | POA: Insufficient documentation

## 2022-09-06 LAB — ECHOCARDIOGRAM COMPLETE
AR max vel: 2.32 cm2
AV Area VTI: 2.36 cm2
AV Area mean vel: 2.35 cm2
AV Mean grad: 5 mmHg
AV Peak grad: 9.5 mmHg
Ao pk vel: 1.54 m/s
Area-P 1/2: 3.34 cm2
S' Lateral: 2.7 cm

## 2022-09-20 DIAGNOSIS — H34232 Retinal artery branch occlusion, left eye: Secondary | ICD-10-CM | POA: Diagnosis not present

## 2022-09-20 DIAGNOSIS — H16223 Keratoconjunctivitis sicca, not specified as Sjogren's, bilateral: Secondary | ICD-10-CM | POA: Diagnosis not present

## 2022-09-29 ENCOUNTER — Telehealth: Payer: Self-pay | Admitting: Family

## 2022-09-29 NOTE — Telephone Encounter (Signed)
Copied from [place] [id number]. Topic: Medicare AWV >> Sep 29, 2022 12:00 PM Devoria Glassing wrote: Reason for CRM: Called patient to schedule Medicare Annual Wellness Visit (AWV). Left message for patient to call back and schedule Medicare Annual Wellness Visit (AWV).  Last date of AWV: NONE  Please schedule an appointment at any time with [facility].  If any questions, please contact me.  Thank you ,  Sherol Dade; [place] Direct Dial: [phone]

## 2022-10-04 ENCOUNTER — Telehealth: Payer: Self-pay | Admitting: Family

## 2022-10-04 NOTE — Telephone Encounter (Signed)
Copied from [place]. Topic: Medicare AWV >> Oct 04, 2022  9:19 AM Devoria Glassing wrote: Reason for CRM:  Called patient to schedule Medicare Annual Wellness Visit (AWV). LVM 10/04/22 to r/s AWV. New appt date 10/12/22 at 9:20am. Please confirm appt date change-khc  Last date of AWV: none  Please schedule an appointment at any time with [facility].  If any questions, please contact me.  Thank you ,  Sherol Dade; [place] Direct Dial: [phone]

## 2022-10-11 ENCOUNTER — Ambulatory Visit: Payer: Medicare PPO

## 2022-10-12 ENCOUNTER — Ambulatory Visit (INDEPENDENT_AMBULATORY_CARE_PROVIDER_SITE_OTHER): Payer: Medicare PPO | Admitting: *Deleted

## 2022-10-12 DIAGNOSIS — Z0000 Encounter for general adult medical examination without abnormal findings: Secondary | ICD-10-CM

## 2022-10-12 NOTE — Patient Instructions (Signed)
Ms. Nancy Deleon , Thank you for taking time to come for your Medicare Wellness Visit. I appreciate your ongoing commitment to your health goals. Please review the following plan we discussed and let me know if I can assist you in the future.   These are the goals we discussed:  Goals   None     This is a list of the screening recommended for you and due dates:  Health Maintenance  Topic Date Due   Eye exam for diabetics  02/23/2022   COVID-19 Vaccine (5 - 2023-24 season) 04/07/2022   Complete foot exam   09/21/2022   Hemoglobin A1C  02/01/2023   Yearly kidney health urinalysis for diabetes  04/29/2023   Yearly kidney function blood test for diabetes  08/03/2023   Medicare Annual Wellness Visit  10/12/2023   Mammogram  08/31/2024   Pap Smear  12/20/2026   DTaP/Tdap/Td vaccine (4 - Td or Tdap) 09/16/2028   Colon Cancer Screening  12/31/2029   Pneumonia Vaccine  Completed   Flu Shot  Completed   DEXA scan (bone density measurement)  Completed   Hepatitis C Screening: USPSTF Recommendation to screen - Ages 49-79 yo.  Completed   HIV Screening  Completed   Zoster (Shingles) Vaccine  Completed   HPV Vaccine  Aged Out     Next appointment: Follow up in one year for your annual wellness visit.   Preventive Care 51 Years and Older, Female Preventive care refers to lifestyle choices and visits with your health care provider that can promote health and wellness. What does preventive care include? A yearly physical exam. This is also called an annual well check. Dental exams once or twice a year. Routine eye exams. Ask your health care provider how often you should have your eyes checked. Personal lifestyle choices, including: Daily care of your teeth and gums. Regular physical activity. Eating a healthy diet. Avoiding tobacco and drug use. Limiting alcohol use. Practicing safe sex. Taking low-dose aspirin every day. Taking vitamin and mineral supplements as recommended by your  health care provider. What happens during an annual well check? The services and screenings done by your health care provider during your annual well check will depend on your age, overall health, lifestyle risk factors, and family history of disease. Counseling  Your health care provider may ask you questions about your: Alcohol use. Tobacco use. Drug use. Emotional well-being. Home and relationship well-being. Sexual activity. Eating habits. History of falls. Memory and ability to understand (cognition). Work and work Statistician. Reproductive health. Screening  You may have the following tests or measurements: Height, weight, and BMI. Blood pressure. Lipid and cholesterol levels. These may be checked every 5 years, or more frequently if you are over 32 years old. Skin check. Lung cancer screening. You may have this screening every year starting at age 65 if you have a 30-pack-year history of smoking and currently smoke or have quit within the past 15 years. Fecal occult blood test (FOBT) of the stool. You may have this test every year starting at age 1. Flexible sigmoidoscopy or colonoscopy. You may have a sigmoidoscopy every 5 years or a colonoscopy every 10 years starting at age 1. Hepatitis C blood test. Hepatitis B blood test. Sexually transmitted disease (STD) testing. Diabetes screening. This is done by checking your blood sugar (glucose) after you have not eaten for a while (fasting). You may have this done every 1-3 years. Bone density scan. This is done to screen for osteoporosis. You  may have this done starting at age 109. Mammogram. This may be done every 1-2 years. Talk to your health care provider about how often you should have regular mammograms. Talk with your health care provider about your test results, treatment options, and if necessary, the need for more tests. Vaccines  Your health care provider may recommend certain vaccines, such as: Influenza vaccine.  This is recommended every year. Tetanus, diphtheria, and acellular pertussis (Tdap, Td) vaccine. You may need a Td booster every 10 years. Zoster vaccine. You may need this after age 54. Pneumococcal 13-valent conjugate (PCV13) vaccine. One dose is recommended after age 44. Pneumococcal polysaccharide (PPSV23) vaccine. One dose is recommended after age 90. Talk to your health care provider about which screenings and vaccines you need and how often you need them. This information is not intended to replace advice given to you by your health care provider. Make sure you discuss any questions you have with your health care provider. Document Released: 08/20/2015 Document Revised: 04/12/2016 Document Reviewed: 05/25/2015 Elsevier Interactive Patient Education  2017 [place] Prevention in the Home Falls can cause injuries. They can happen to people of all ages. There are many things you can do to make your home safe and to help prevent falls. What can I do on the outside of my home? Regularly fix the edges of walkways and driveways and fix any cracks. Remove anything that might make you trip as you walk through a door, such as a raised step or threshold. Trim any bushes or trees on the path to your home. Use bright outdoor lighting. Clear any walking paths of anything that might make someone trip, such as rocks or tools. Regularly check to see if handrails are loose or broken. Make sure that both sides of any steps have handrails. Any raised decks and porches should have guardrails on the edges. Have any leaves, snow, or ice cleared regularly. Use sand or salt on walking paths during winter. Clean up any spills in your garage right away. This includes oil or grease spills. What can I do in the bathroom? Use night lights. Install grab bars by the toilet and in the tub and shower. Do not use towel bars as grab bars. Use non-skid mats or decals in the tub or shower. If you need to sit  down in the shower, use a plastic, non-slip stool. Keep the floor dry. Clean up any water that spills on the floor as soon as it happens. Remove soap buildup in the tub or shower regularly. Attach bath mats securely with double-sided non-slip rug tape. Do not have throw rugs and other things on the floor that can make you trip. What can I do in the bedroom? Use night lights. Make sure that you have a light by your bed that is easy to reach. Do not use any sheets or blankets that are too big for your bed. They should not hang down onto the floor. Have a firm chair that has side arms. You can use this for support while you get dressed. Do not have throw rugs and other things on the floor that can make you trip. What can I do in the kitchen? Clean up any spills right away. Avoid walking on wet floors. Keep items that you use a lot in easy-to-reach places. If you need to reach something above you, use a strong step stool that has a grab bar. Keep electrical cords out of the way. Do not use floor  polish or wax that makes floors slippery. If you must use wax, use non-skid floor wax. Do not have throw rugs and other things on the floor that can make you trip. What can I do with my stairs? Do not leave any items on the stairs. Make sure that there are handrails on both sides of the stairs and use them. Fix handrails that are broken or loose. Make sure that handrails are as long as the stairways. Check any carpeting to make sure that it is firmly attached to the stairs. Fix any carpet that is loose or worn. Avoid having throw rugs at the top or bottom of the stairs. If you do have throw rugs, attach them to the floor with carpet tape. Make sure that you have a light switch at the top of the stairs and the bottom of the stairs. If you do not have them, ask someone to add them for you. What else can I do to help prevent falls? Wear shoes that: Do not have high heels. Have rubber bottoms. Are  comfortable and fit you well. Are closed at the toe. Do not wear sandals. If you use a stepladder: Make sure that it is fully opened. Do not climb a closed stepladder. Make sure that both sides of the stepladder are locked into place. Ask someone to hold it for you, if possible. Clearly mark and make sure that you can see: Any grab bars or handrails. First and last steps. Where the edge of each step is. Use tools that help you move around (mobility aids) if they are needed. These include: Canes. Walkers. Scooters. Crutches. Turn on the lights when you go into a dark area. Replace any light bulbs as soon as they burn out. Set up your furniture so you have a clear path. Avoid moving your furniture around. If any of your floors are uneven, fix them. If there are any pets around you, be aware of where they are. Review your medicines with your doctor. Some medicines can make you feel dizzy. This can increase your chance of falling. Ask your doctor what other things that you can do to help prevent falls. This information is not intended to replace advice given to you by your health care provider. Make sure you discuss any questions you have with your health care provider. Document Released: 05/20/2009 Document Revised: 12/30/2015 Document Reviewed: 08/28/2014 Elsevier Interactive Patient Education  2017 [employer].

## 2022-10-12 NOTE — Progress Notes (Signed)
Subjective:   Nancy Deleon is a 67 y.o. female who presents for an Initial Medicare Annual Wellness Visit.  I connected with  Nancy Deleon on 10/12/22 by a audio enabled telemedicine application and verified that I am speaking with the correct person using two identifiers.  Patient Location: Home  Provider Location: Office/Clinic  I discussed the limitations of evaluation and management by telemedicine. The patient expressed understanding and agreed to proceed.   Review of Systems     Cardiac Risk Factors include: advanced age (>39mn, >>91women);diabetes mellitus;dyslipidemia;hypertension     Objective:    There were no vitals filed for this visit. There is no height or weight on file to calculate BMI.     10/12/2022    9:40 AM 04/23/2019    8:57 AM 05/01/2017    7:51 AM 04/25/2017    4:27 PM 04/16/2015    8:37 PM  Advanced Directives  Does Patient Have a Medical Advance Directive? No No No No Yes  Type of APersonnel officerLiving will  Would patient like information on creating a medical advance directive? No - Patient declined No - Patient declined No - Patient declined      Current Medications (verified) Outpatient Encounter Medications as of 10/12/2022  Medication Sig   acetaminophen (TYLENOL) 500 MG tablet Take 1,000 mg by mouth every 6 (six) hours as needed.   amLODipine (NORVASC) 10 MG tablet Take 1 tablet (10 mg total) by mouth daily.   aspirin EC 81 MG tablet Take 1 tablet (81 mg total) by mouth daily. Swallow whole.   atorvastatin (LIPITOR) 10 MG tablet Take 1 tablet (10 mg total) by mouth daily.   azelastine (ASTELIN) 0.1 % nasal spray Place 1 spray into both nostrils 2 (two) times daily. Use in each nostril as directed   betamethasone dipropionate 0.05 % cream Apply topically 2 (two) times daily as needed.   calcium-vitamin D (OSCAL WITH D) 500-200 MG-UNIT tablet Take 1 tablet by mouth.   cetirizine (ZYRTEC) 10  MG tablet Take 1 tablet (10 mg total) by mouth daily.   Multiple Vitamins-Minerals (CENTRUM ULTRA WOMENS) TABS Take 1 tablet by mouth daily.   sitaGLIPtin (JANUVIA) 50 MG tablet Take 1 tablet (50 mg total) by mouth daily.   valsartan (DIOVAN) 80 MG tablet Take 1 tablet (80 mg total) by mouth daily.   No facility-administered encounter medications on file as of 10/12/2022.    Allergies (verified) Lisinopril, Metformin and related, Naproxen, Ropinirole, Valsartan, Vicodin [hydrocodone-acetaminophen], and Propoxyphene n-acetaminophen   History: Past Medical History:  Diagnosis Date   Allergy    Fatty liver 04/03/2014   GERD (gastroesophageal reflux disease)    Hypertension    Intraductal papilloma of breast 08/15/2017   Multiple thyroid nodules 08/20/2017   Obesity    Type 2 diabetes mellitus, controlled, with renal complications ([id number]  12/18/19-pt states she does not have diabetes, just checks blood sugar as needed.   Past Surgical History:  Procedure Laterality Date   BIOPSY THYROID     BREAST BIOPSY  04/24/11   rigth breast   BREAST EXCISIONAL BIOPSY     BREAST LUMPECTOMY WITH RADIOACTIVE SEED LOCALIZATION Left 05/01/2017   Procedure: LEFT BREAST LUMPECTOMY WITH RADIOACTIVE SEED LOCALIZATION;  Surgeon: TDonnie Mesa MD;  [facility]  Service: General;  Laterality: Left;   CESAREAN SECTION  [id number]  [place]  toenail removal  09/2020   Dr. Cannon Kettle   TUBAL LIGATION     UPPER GASTROINTESTINAL ENDOSCOPY     Family History  Problem Relation Age of Onset   Lymphoma Mother    Dementia Mother    Cancer Father        lung   Colon cancer Maternal Uncle    Esophageal cancer Neg Hx    Pancreatic cancer Neg Hx    Rectal cancer Neg Hx    Stomach cancer Neg Hx    Thyroid disease Neg Hx    Colon polyps Neg Hx    Social History   Socioeconomic History   Marital status: Widowed    Spouse name: Not on file   Number  of children: 2   Years of education: Not on file   Highest education level: Not on file  Occupational History    Employer: [employer]  Tobacco Use   Smoking status: Never   Smokeless tobacco: Never  Vaping Use   Vaping Use: Never used  Substance and Sexual Activity   Alcohol use: Yes    Alcohol/week: 0.0 standard drinks of alcohol    Comment: occasional wine   Drug use: No   Sexual activity: Not Currently    Partners: Male    Birth control/protection: Post-menopausal    Comment: 1st intercourse 67 yo-Fewer than 5 partners  Other Topics Concern   Not on file  Social History Narrative   Regular exercise:  Walks 2-5 x weekly   Caffeine Use: no   Widowed   She has a son [name]- he is 9 yrs old.   Foster parent.   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   Dog (Lab named Abby)         Social Determinants of Health   Financial Resource Strain: Low Risk  (10/12/2022)   Overall Financial Resource Strain (CARDIA)    Difficulty of Paying Living Expenses: Not hard at all  Food Insecurity: No Food Insecurity (10/12/2022)   Hunger Vital Sign    Worried About Running Out of Food in the Last Year: Never true    Ran Out of Food in the Last Year: Never true  Transportation Needs: No Transportation Needs (10/12/2022)   PRAPARE - Hydrologist (Medical): No    Lack of Transportation (Non-Medical): No  Physical Activity: Insufficiently Active (10/12/2022)   Exercise Vital Sign    Days of Exercise per Week: 2 days    Minutes of Exercise per Session: 60 min  Stress: No Stress Concern Present (10/12/2022)   [place]    Feeling of Stress : Not at all  Social Connections: Moderately Integrated (10/12/2022)   Social Connection and Isolation Panel [NHANES]    Frequency of Communication with Friends and Family: More than three times a week     Frequency of Social Gatherings with Friends and Family: Twice a week    Attends Religious Services: More than 4 times per year    Active Member of [employer] or Organizations: Yes    Attends Archivist Meetings: More than 4 times per year    Marital Status: Widowed    Tobacco Counseling Counseling given: Not Answered   Clinical Intake:  Pre-visit preparation completed: Yes  Pain : No/denies pain  Nutritional Risks: None Diabetes: Yes CBG done?: No Did pt. bring in  CBG monitor from home?: No  How often do you need to have someone help you when you read instructions, pamphlets, or other written materials from your doctor or pharmacy?: 1 - Never   Activities of Daily Living    10/12/2022    9:45 AM  In your present state of health, do you have any difficulty performing the following activities:  Hearing? 0  Vision? 0  Difficulty concentrating or making decisions? 0  Walking or climbing stairs? 0  Dressing or bathing? 0  Doing errands, shopping? 0  Preparing Food and eating ? N  Using the Toilet? N  In the past six months, have you accidently leaked urine? N  Do you have problems with loss of bowel control? N  Managing your Medications? N  Managing your Finances? N  Housekeeping or managing your Housekeeping? N    Patient Care Team: [name], NP as PCP - General (Internal Medicine) Fontaine, Belinda Block, MD (Inactive) (Obstetrics and Gynecology) Debbra Riding, MD as Consulting Physician (Ophthalmology)  Indicate any recent Medical Services you may have received from other than [facility] providers in the past year (date may be approximate).     Assessment:   This is a routine wellness examination for Adelina.  Hearing/Vision screen No results found.  Dietary issues and exercise activities discussed: Current Exercise Habits: Home exercise routine, Type of exercise: walking;Other - see comments (zumba class twice a week), Time (Minutes): 60, Frequency  (Times/Week): 5, Weekly Exercise (Minutes/Week): 300, Intensity: Moderate, Exercise limited by: None identified   Goals Addressed   None    Depression Screen    10/12/2022    9:45 AM 12/19/2021    9:24 AM 11/12/2020    8:07 AM 07/23/2020    8:26 AM 04/23/2019    8:57 AM 09/16/2018   10:32 AM 08/15/2017   11:36 AM  PHQ 2/9 Scores  PHQ - 2 Score 0 0 0 0 0 0 0  PHQ- 9 Score      0 0    Fall Risk    10/12/2022    9:41 AM 04/28/2022    9:17 AM 12/24/2020    8:13 AM 11/12/2020    8:07 AM 04/23/2019    8:56 AM  Fall Risk   Falls in the past year? 0 0 0 0 0  Number falls in past yr: 0 0 0 0   Injury with Fall? 0 0 0 0   Risk for fall due to : No Fall Risks  No Fall Risks No Fall Risks   Follow up Falls evaluation completed        FALL RISK PREVENTION PERTAINING TO THE HOME:  Any stairs in or around the home? No  Home free of loose throw rugs in walkways, pet beds, electrical cords, etc? Yes  Adequate lighting in your home to reduce risk of falls? Yes   ASSISTIVE DEVICES UTILIZED TO PREVENT FALLS:  Life alert? No  Use of a cane, walker or w/c? No  Grab bars in the bathroom? No  Shower chair or bench in shower? No  Elevated toilet seat or a handicapped toilet? No   TIMED UP AND GO:  Was the test performed?  No, audio visit .    Cognitive Function:        10/12/2022    9:48 AM  6CIT Screen  What Year? 0 points  What month? 0 points  What time? 0 points  Count back from 20 0 points  Months in reverse  0 points  Repeat phrase 2 points  Total Score 2 points    Immunizations Immunization History  Administered Date(s) Administered   Fluad Quad(high Dose 65+) 04/28/2022   Influenza Whole 05/08/2012   Influenza,inj,Quad PF,6+ Mos 04/01/2014, 04/28/2015, 08/15/2017, 04/25/2018, 04/29/2019, 04/21/2020, 06/20/2021   Influenza-Unspecified 06/07/2013, 04/07/2016   PFIZER Comirnaty(Gray Top)Covid-19 Tri-Sucrose Vaccine 12/24/2020   PFIZER(Purple Top)SARS-COV-2 Vaccination  10/18/2019, 11/12/2019, 07/23/2020   PNEUMOCOCCAL CONJUGATE-20 06/20/2021   PPD Test 11/21/2011   Pneumococcal Polysaccharide-23 06/11/2018   Td 08/08/1995, 12/30/2008   Tdap 09/16/2018   Zoster Recombinat (Shingrix) 04/16/2018, 06/20/2018    TDAP status: Up to date  Flu Vaccine status: Up to date  Pneumococcal vaccine status: Up to date  Covid-19 vaccine status: Information provided on how to obtain vaccines.   Qualifies for Shingles Vaccine? Yes   Zostavax completed No   Shingrix Completed?: Yes  Screening Tests Health Maintenance  Topic Date Due   Medicare Annual Wellness (AWV)  Never done   OPHTHALMOLOGY EXAM  02/23/2022   COVID-19 Vaccine (5 - 2023-24 season) 04/07/2022   FOOT EXAM  09/21/2022   HEMOGLOBIN A1C  02/01/2023   Diabetic kidney evaluation - Urine ACR  04/29/2023   Diabetic kidney evaluation - eGFR measurement  08/03/2023   MAMMOGRAM  08/31/2024   PAP SMEAR-Modifier  12/20/2026   DTaP/Tdap/Td (4 - Td or Tdap) 09/16/2028   COLONOSCOPY (Pts 45-18yr Insurance coverage will need to be confirmed)  12/31/2029   Pneumonia Vaccine 67 Years old  Completed   INFLUENZA VACCINE  Completed   DEXA SCAN  Completed   Hepatitis C Screening  Completed   HIV Screening  Completed   Zoster Vaccines- Shingrix  Completed   HPV VACCINES  Aged Out    Health Maintenance  Health Maintenance Due  Topic Date Due   Medicare Annual Wellness (AWV)  Never done   OPHTHALMOLOGY EXAM  02/23/2022   COVID-19 Vaccine (5 - 2023-24 season) 04/07/2022   FOOT EXAM  09/21/2022    Colorectal cancer screening: Type of screening: Colonoscopy. Completed 01/01/20. Repeat every 10 years  Mammogram status: Completed 08/31/22. Repeat every year  Bone Density status: Completed 09/20/18. Results reflect: Bone density results: NORMAL. Repeat every 2 years.  Lung Cancer Screening: (Low Dose CT Chest recommended if Age 66-80years, 30 pack-year currently smoking OR have quit w/in 15years.) does not  qualify.   Additional Screening:  Hepatitis C Screening: does qualify; Completed 10/19/17  Vision Screening: Recommended annual ophthalmology exams for early detection of glaucoma and other disorders of the eye. Is the patient up to date with their annual eye exam?  Yes  Who is the provider or what is the name of the office in which the patient attends annual [facility] exams? [facility] If pt is not established with a provider, would they like to be referred to a provider to establish care? No .   Dental Screening: Recommended annual dental exams for proper oral hygiene  Community Resource Referral / Chronic Care Management: CRR required this visit?  No   CCM required this visit?  No      Plan:     I have personally reviewed and noted the following in the patient's chart:   Medical and social history Use of alcohol, tobacco or illicit drugs  Current medications and supplements including opioid prescriptions. Patient is not currently taking opioid prescriptions. Functional ability and status Nutritional status Physical activity Advanced directives List of other physicians Hospitalizations, surgeries, and ER visits in previous 11  months Vitals Screenings to include cognitive, depression, and falls Referrals and appointments  In addition, I have reviewed and discussed with patient certain preventive protocols, quality metrics, and best practice recommendations. A written personalized care plan for preventive services as well as general preventive health recommendations were provided to patient.   Due to this being a telephonic visit, the after visit summary with patients personalized plan was offered to patient via mail or my-chart. Patient would like to access on my-chart.  Beatris Ship, [place]   10/12/2022   Nurse Notes: None

## 2022-11-24 ENCOUNTER — Telehealth: Payer: Self-pay | Admitting: Family

## 2022-11-24 ENCOUNTER — Ambulatory Visit (INDEPENDENT_AMBULATORY_CARE_PROVIDER_SITE_OTHER): Payer: Medicare PPO | Admitting: Family

## 2022-11-24 VITALS — BP 132/58 | HR 64 | Temp 98.6°F | Resp 16 | Wt 227.0 lb

## 2022-11-24 DIAGNOSIS — F419 Anxiety disorder, unspecified: Secondary | ICD-10-CM

## 2022-11-24 DIAGNOSIS — R519 Headache, unspecified: Secondary | ICD-10-CM

## 2022-11-24 DIAGNOSIS — K219 Gastro-esophageal reflux disease without esophagitis: Secondary | ICD-10-CM | POA: Diagnosis not present

## 2022-11-24 DIAGNOSIS — J302 Other seasonal allergic rhinitis: Secondary | ICD-10-CM

## 2022-11-24 DIAGNOSIS — R809 Proteinuria, unspecified: Secondary | ICD-10-CM | POA: Diagnosis not present

## 2022-11-24 DIAGNOSIS — E785 Hyperlipidemia, unspecified: Secondary | ICD-10-CM

## 2022-11-24 DIAGNOSIS — E1129 Type 2 diabetes mellitus with other diabetic kidney complication: Secondary | ICD-10-CM | POA: Diagnosis not present

## 2022-11-24 DIAGNOSIS — D509 Iron deficiency anemia, unspecified: Secondary | ICD-10-CM

## 2022-11-24 DIAGNOSIS — Z7984 Long term (current) use of oral hypoglycemic drugs: Secondary | ICD-10-CM

## 2022-11-24 DIAGNOSIS — J301 Allergic rhinitis due to pollen: Secondary | ICD-10-CM | POA: Diagnosis not present

## 2022-11-24 DIAGNOSIS — I10 Essential (primary) hypertension: Secondary | ICD-10-CM

## 2022-11-24 LAB — HEMOGLOBIN A1C: Hgb A1c MFr Bld: 6.5 % (ref 4.6–6.5)

## 2022-11-24 LAB — COMPREHENSIVE METABOLIC PANEL
ALT: 15 U/L (ref 0–35)
AST: 16 U/L (ref 0–37)
Albumin: 4.4 g/dL (ref 3.5–5.2)
Alkaline Phosphatase: 70 U/L (ref 39–117)
BUN: 13 mg/dL (ref 6–23)
CO2: 26 mEq/L (ref 19–32)
Calcium: 9.5 mg/dL (ref 8.4–10.5)
Chloride: 105 mEq/L (ref 96–112)
Creatinine, Ser: 0.91 mg/dL (ref 0.40–1.20)
GFR: 65.95 mL/min (ref >60.00)
Glucose, Bld: 148 mg/dL — ABNORMAL HIGH (ref 70–99)
Potassium: 4 mEq/L (ref 3.5–5.1)
Sodium: 140 mEq/L (ref 135–145)
Total Bilirubin: 0.9 mg/dL (ref 0.2–1.2)
Total Protein: 7.1 g/dL (ref 6.0–8.3)

## 2022-11-24 MED ORDER — BLOOD GLUCOSE TEST VI STRP: 1.0000 | ORAL_STRIP | Freq: Three times a day (TID) | 0 refills | Status: AC

## 2022-11-24 MED ORDER — CETIRIZINE HCL 10 MG PO TABS: 10.0000 mg | ORAL_TABLET | Freq: Every day | ORAL | 4 refills | Status: DC

## 2022-11-24 MED ORDER — AMLODIPINE BESYLATE 10 MG PO TABS: 10.0000 mg | ORAL_TABLET | Freq: Every day | ORAL | 1 refills | Status: DC

## 2022-11-24 MED ORDER — LANCET DEVICE MISC: 1.0000 | Freq: Three times a day (TID) | 0 refills | Status: AC

## 2022-11-24 MED ORDER — BLOOD GLUCOSE MONITORING SUPPL DEVI: 1.0000 | Freq: Three times a day (TID) | 0 refills | Status: AC

## 2022-11-24 MED ORDER — LANCETS MISC. MISC: 1.0000 | Freq: Three times a day (TID) | 0 refills | Status: AC

## 2022-11-24 MED ORDER — SITAGLIPTIN PHOSPHATE 50 MG PO TABS: 50.0000 mg | ORAL_TABLET | Freq: Every day | ORAL | 1 refills | Status: DC

## 2022-11-24 MED ORDER — VALSARTAN 80 MG PO TABS: 80.0000 mg | ORAL_TABLET | Freq: Every day | ORAL | 1 refills | Status: DC

## 2022-11-24 MED ORDER — ESOMEPRAZOLE MAGNESIUM 20 MG PO CPDR: 20.0000 mg | DELAYED_RELEASE_CAPSULE | Freq: Every day | ORAL | Status: DC | PRN

## 2022-11-24 NOTE — Assessment & Plan Note (Signed)
Lab Results  Component Value Date   HGBA1C 7.3 (H) 08/02/2022   Last A1C was above goal. She is on [place]. Repeat A1C.

## 2022-11-24 NOTE — Progress Notes (Signed)
Subjective:     Patient ID: Nancy Deleon, female    DOB: [birth year], 67 y.o.   MRN: [id number]  Chief Complaint  Patient presents with   Hypertension    Here for follow up   Diabetes    Here for follow up     Patient is in today for follow up.  HTN- maintained on valsartan 80mg  and amlodipine 10mg .   BP Readings from Last 3 Encounters:  11/24/22 (!) 132/58  08/18/22 (!) 128/58  07/26/22 (!) 135/50   DM2- maintained on januvia.  Lab Results  Component Value Date   HGBA1C 7.3 (H) 08/02/2022   HGBA1C 7.0 (H) 04/28/2022   HGBA1C 6.9 (H) 04/28/2022   Lab Results  Component Value Date   MICROALBUR 6.3 (H) 04/28/2022   LDLCALC 63 04/28/2022   CREATININE 0.89 08/02/2022   Hyperlipidemia- maintained on lipitor 10mg .  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    Past Surgical History:  Procedure Laterality Date   BIOPSY THYROID     BREAST BIOPSY  04/24/11   [facility] breast   BREAST EXCISIONAL BIOPSY     BREAST LUMPECTOMY WITH RADIOACTIVE SEED LOCALIZATION Left 05/01/2017   Procedure: LEFT BREAST LUMPECTOMY WITH RADIOACTIVE SEED LOCALIZATION;  Surgeon: Manus Rudd, MD;  Location: [facility];  Service: General;  Laterality: Left;   CESAREAN SECTION  [id number]   COLONOSCOPY   2011   [facility] [facility]-  normal   toenail removal  09/2020   Dr. Marylene Land   TUBAL LIGATION     UPPER GASTROINTESTINAL ENDOSCOPY      Family History  Problem Relation Age of Onset   Lymphoma Mother    Dementia Mother    Cancer Father        lung   Colon cancer Maternal Uncle    Esophageal cancer Neg Hx    Pancreatic cancer Neg Hx    Rectal cancer Neg Hx    Stomach cancer Neg Hx    Thyroid disease Neg Hx    Colon polyps Neg Hx     Social History   Socioeconomic History   Marital status: Widowed    Spouse name: Not on file   Number of children: 2   Years of education: Not on file   Highest education level: Not on file  Occupational History    Employer: [employer]  Tobacco Use   Smoking status: Never   Smokeless tobacco: Never  Vaping Use   Vaping Use: Never used  Substance and Sexual Activity   Alcohol use: Yes    Alcohol/week: 0.0 standard drinks of alcohol    Comment: occasional wine   Drug use: No   Sexual activity: Not Currently    Partners: Male  Birth control/protection: Post-menopausal    Comment: 1st intercourse 67 yo-Fewer than 5 partners  Other Topics Concern   Not on file  Social History Narrative   Regular exercise:  Walks 2-5 x weekly   Caffeine Use: no   Widowed   [name]- he is 61 yrs old.   Foster parent.   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   Dog (Lab named Abby)         Social Determinants of Health   Financial Resource Strain: Low Risk  (10/12/2022)   Overall Financial Resource Strain (CARDIA)    Difficulty of Paying Living Expenses: Not hard at all  Food Insecurity: No Food Insecurity (10/12/2022)   Hunger Vital Sign    Worried About Running Out of Food in the Last Year: Never true    Ran Out of Food in the Last Year: Never true  Transportation Needs: No Transportation Needs (10/12/2022)   PRAPARE - Administrator, Civil Service (Medical): No     Lack of Transportation (Non-Medical): No  Physical Activity: Insufficiently Active (10/12/2022)   Exercise Vital Sign    Days of Exercise per Week: 2 days    Minutes of Exercise per Session: 60 min  Stress: No Stress Concern Present (10/12/2022)   [employer] of Occupational Health - Occupational Stress Questionnaire    Feeling of Stress : Not at all  Social Connections: Moderately Integrated (10/12/2022)   Social Connection and Isolation Panel [NHANES]    Frequency of Communication with Friends and Family: More than three times a week    Frequency of Social Gatherings with Friends and Family: Twice a week    Attends Religious Services: More than 4 times per year    Active Member of [employer] or Organizations: Yes    Attends Banker Meetings: More than 4 times per year    Marital Status: Widowed  Intimate Partner Violence: Not At Risk (10/12/2022)   Humiliation, Afraid, Rape, and Kick questionnaire    Fear of Current or Ex-Partner: No    Emotionally Abused: No    Physically Abused: No    Sexually Abused: No    Outpatient Medications Prior to Visit  Medication Sig Dispense Refill   acetaminophen (TYLENOL) 500 MG tablet Take 1,000 mg by mouth every 6 (six) hours as needed.     aspirin EC 81 MG tablet Take 1 tablet (81 mg total) by mouth daily. Swallow whole. 30 tablet 12   atorvastatin (LIPITOR) 10 MG tablet Take 1 tablet (10 mg total) by mouth daily. 90 tablet 1   calcium-vitamin D (OSCAL WITH D) 500-200 MG-UNIT tablet Take 1 tablet by mouth.     Multiple Vitamins-Minerals (CENTRUM ULTRA WOMENS) TABS Take 1 tablet by mouth daily.     amLODipine (NORVASC) 10 MG tablet Take 1 tablet (10 mg total) by mouth daily. 90 tablet 1   azelastine (ASTELIN) 0.1 % nasal spray Place 1 spray into both nostrils 2 (two) times daily. Use in each nostril as directed 90 mL 1   betamethasone dipropionate 0.05 % cream Apply topically 2 (two) times daily as needed. 30 g 2   cetirizine (ZYRTEC) 10 MG  tablet Take 1 tablet (10 mg total) by mouth daily. 90 tablet 4   sitaGLIPtin (JANUVIA) 50 MG tablet Take 1 tablet (50 mg total) by mouth daily. 90 tablet 1   valsartan (DIOVAN) 80 MG  tablet Take 1 tablet (80 mg total) by mouth daily. 90 tablet 1   No facility-administered medications prior to visit.    Allergies  Allergen Reactions   Lisinopril     cough   Metformin And Related Other (See Comments)    nausea   Naproxen Nausea And Vomiting    REACTION: vomiting   Ropinirole Nausea Only   Valsartan     Cough    Vicodin [Hydrocodone-Acetaminophen] Nausea And Vomiting   Propoxyphene N-Acetaminophen Nausea And Vomiting    ROS     Objective:    Physical Exam Constitutional:      General: She is not in acute distress.    Appearance: Normal appearance. She is well-developed.  HENT:     Head: Normocephalic and atraumatic.     Right Ear: External ear normal.     Left Ear: External ear normal.  Eyes:     General: No scleral icterus. Neck:     Thyroid: No thyromegaly.  Cardiovascular:     Rate and Rhythm: Normal rate and regular rhythm.     Heart sounds: Murmur heard.     Systolic murmur is present with a grade of 2/6.  Pulmonary:     Effort: Pulmonary effort is normal. No respiratory distress.     Breath sounds: Normal breath sounds. No wheezing.  Musculoskeletal:     Cervical back: Neck supple.  Skin:    General: Skin is warm and dry.  Neurological:     Mental Status: She is alert and oriented to person, place, and time.  Psychiatric:        Mood and Affect: Mood normal.        Behavior: Behavior normal.        Thought Content: Thought content normal.        Judgment: Judgment normal.     BP (!) 132/58 (BP Location: Left Arm, Patient Position: Sitting, Cuff Size: Large)   Pulse 64   Temp 98.6 F (37 C) (Oral)   Resp 16   Wt 227 lb (103 kg)   LMP 02/28/2007   SpO2 98%   BMI 35.55 kg/m  Wt Readings from Last 3 Encounters:  11/24/22 227 lb (103 kg)   08/18/22 225 lb (102.1 kg)  07/26/22 225 lb (102.1 kg)   Diabetic Foot Exam - Simple   Simple Foot Form Diabetic Foot exam was performed with the following findings: Yes 11/24/2022 11:34 AM  Visual Inspection No deformities, no ulcerations, no other skin breakdown bilaterally: Yes Sensation Testing Intact to touch and monofilament testing bilaterally: Yes Pulse Check Posterior Tibialis and Dorsalis pulse intact bilaterally: Yes Comments        Assessment & Plan:   Problem List Items Addressed This Visit       Unprioritized   Type 2 diabetes mellitus, controlled, with renal complications    Lab Results  Component Value Date   HGBA1C 7.3 (H) 08/02/2022  Last A1C was above goal. She is on [place]. Repeat A1C.       Relevant Medications   Blood Glucose Monitoring Suppl DEVI   Glucose Blood (BLOOD GLUCOSE TEST STRIPS) STRP   Lancet Device MISC   Lancets Misc. MISC   esomeprazole (NEXIUM) 20 MG capsule   sitaGLIPtin (JANUVIA) 50 MG tablet   valsartan (DIOVAN) 80 MG tablet   Other Relevant Orders   HgB A1c   Comp Met (CMET)   Microalbuminuria   Relevant Medications   valsartan (DIOVAN) 80 MG tablet   Hyperlipidemia  LDL at goal, continue lipitor.      Relevant Medications   valsartan (DIOVAN) 80 MG tablet   amLODipine (NORVASC) 10 MG tablet   RESOLVED: Headache    No recent headaches.       Relevant Medications   amLODipine (NORVASC) 10 MG tablet   GERD (gastroesophageal reflux disease)    Well controlled with diet.  She reports that she uses prn nexium otc.       Relevant Medications   esomeprazole (NEXIUM) 20 MG capsule   Essential hypertension    BP Readings from Last 3 Encounters:  11/24/22 (!) 132/58  08/18/22 (!) 128/58  07/26/22 (!) 135/50        Relevant Medications   valsartan (DIOVAN) 80 MG tablet   amLODipine (NORVASC) 10 MG tablet   Anxiety    Notes some anxiety of long distance driving. Encouraged her consider counseling.        ANEMIA, IRON DEFICIENCY    Lab Results  Component Value Date   WBC 5.6 08/02/2022   HGB 13.0 08/02/2022   HCT 38.0 08/02/2022   MCV 74.4 (L) 08/02/2022   PLT 181.0 08/02/2022  Last BC OK.       Allergic rhinitis - Primary    Stable on zyrtec.       Relevant Medications   cetirizine (ZYRTEC) 10 MG tablet    I have discontinued Chaselynn Kepple. Ellinger's azelastine and betamethasone dipropionate. I am also having her start on Blood Glucose Monitoring Suppl, BLOOD GLUCOSE TEST STRIPS, Lancet Device, Lancets Misc., and esomeprazole. Additionally, I am having her maintain her calcium-vitamin D, Centrum Ultra Womens, acetaminophen, atorvastatin, aspirin EC, sitaGLIPtin, valsartan, cetirizine, and amLODipine.  Meds ordered this encounter  Medications   Blood Glucose Monitoring Suppl DEVI    Sig: 1 each by Does not apply route in the morning, at noon, and at bedtime. May substitute to any manufacturer covered by patient's insurance.    Dispense:  1 each    Refill:  0    Order Specific Question:   Supervising Provider    Answer:   Danise Edge A [[id number]]   Glucose Blood (BLOOD GLUCOSE TEST STRIPS) STRP    Sig: 1 each by In Vitro route in the morning, at noon, and at bedtime. May substitute to any manufacturer covered by patient's insurance.    Dispense:  100 strip    Refill:  0    Order Specific Question:   Supervising Provider    Answer:   Danise Edge A [[id number]]   Lancet Device MISC    Sig: 1 each by Does not apply route in the morning, at noon, and at bedtime. May substitute to any manufacturer covered by patient's insurance.    Dispense:  1 each    Refill:  0    Order Specific Question:   Supervising Provider    Answer:   Danise Edge A [[id number]]   Lancets Misc. MISC    Sig: 1 each by Does not apply route in the morning, at noon, and at bedtime. May substitute to any manufacturer covered by patient's insurance.    Dispense:  100 each    Refill:  0    Order Specific Question:    Supervising Provider    Answer:   Danise Edge A [[id number]]   esomeprazole (NEXIUM) 20 MG capsule    Sig: Take 1 capsule (20 mg total) by mouth daily as needed.    Order Specific Question:   Supervising Provider  Answer:   Danise Edge A [[id number]]   sitaGLIPtin (JANUVIA) 50 MG tablet    Sig: Take 1 tablet (50 mg total) by mouth daily.    Dispense:  90 tablet    Refill:  1    Order Specific Question:   Supervising Provider    Answer:   Danise Edge A [[id number]]   valsartan (DIOVAN) 80 MG tablet    Sig: Take 1 tablet (80 mg total) by mouth daily.    Dispense:  90 tablet    Refill:  1    Order Specific Question:   Supervising Provider    Answer:   Danise Edge A [[id number]]   cetirizine (ZYRTEC) 10 MG tablet    Sig: Take 1 tablet (10 mg total) by mouth daily.    Dispense:  90 tablet    Refill:  4    Order Specific Question:   Supervising Provider    Answer:   Danise Edge A [[id number]]   amLODipine (NORVASC) 10 MG tablet    Sig: Take 1 tablet (10 mg total) by mouth daily.    Dispense:  90 tablet    Refill:  1    Order Specific Question:   Supervising Provider    Answer:   Danise Edge A [[id number]]

## 2022-11-24 NOTE — Assessment & Plan Note (Signed)
Stable on zyrtec

## 2022-11-24 NOTE — Assessment & Plan Note (Signed)
BP Readings from Last 3 Encounters:  11/24/22 (!) 132/58  08/18/22 (!) 128/58  07/26/22 (!) 135/50

## 2022-11-24 NOTE — Assessment & Plan Note (Signed)
LDL at goal, continue lipitor.  

## 2022-11-24 NOTE — Assessment & Plan Note (Signed)
Lab Results  Component Value Date   WBC 5.6 08/02/2022   HGB 13.0 08/02/2022   HCT 38.0 08/02/2022   MCV 74.4 (L) 08/02/2022   PLT 181.0 08/02/2022   Last BC OK.

## 2022-11-24 NOTE — Assessment & Plan Note (Signed)
Notes some anxiety of long distance driving. Encouraged her consider counseling.

## 2022-11-24 NOTE — Assessment & Plan Note (Signed)
>>  ASSESSMENT AND PLAN FOR ALLERGIC RHINITIS WRITTEN ON 11/24/2022 11:28 AM BY O'SULLIVAN, Kameelah Minish, NP  Stable on zyrtec .

## 2022-11-24 NOTE — Telephone Encounter (Signed)
Please call [facility] to request DM eye exam.

## 2022-11-24 NOTE — Assessment & Plan Note (Signed)
Well controlled with diet.  She reports that she uses prn nexium otc.

## 2022-11-24 NOTE — Assessment & Plan Note (Signed)
No recent headaches. 

## 2022-11-27 NOTE — Telephone Encounter (Signed)
Electronic request sent 

## 2022-12-28 ENCOUNTER — Telehealth: Payer: Self-pay | Admitting: Family

## 2022-12-28 NOTE — Telephone Encounter (Signed)
Pt stated that she is no longer able [facility] use [facility] and would like to have all her medications sent to [facility] going forward  Prescription Request  12/28/2022  Is this a "Controlled Substance" medicine? No  LOV: 11/24/2022  What is the name of the medication or equipment?   atorvastatin (LIPITOR) 10 MG tablet [[id number]]   Have you contacted your pharmacy to request a refill? No   Which pharmacy would you like this sent to?   [id number] - [place], [place] - [address] [place] [address] Phone: [phone] Fax: [phone]  Patient notified that their request is being sent to the clinical staff for review and that they should receive a response within 2 business days.   Please advise at Mobile [phone] (mobile)

## 2022-12-29 ENCOUNTER — Other Ambulatory Visit: Payer: Self-pay

## 2022-12-29 MED ORDER — ATORVASTATIN CALCIUM 10 MG PO TABS: 10.0000 mg | ORAL_TABLET | Freq: Every day | ORAL | 1 refills | Status: DC

## 2023-01-08 ENCOUNTER — Other Ambulatory Visit: Payer: Self-pay | Admitting: Family

## 2023-01-08 DIAGNOSIS — E1129 Type 2 diabetes mellitus with other diabetic kidney complication: Secondary | ICD-10-CM

## 2023-01-22 ENCOUNTER — Telehealth: Payer: Self-pay

## 2023-01-22 NOTE — Telephone Encounter (Signed)
Appt scheduled w/ Melissa 01/23/23.

## 2023-01-22 NOTE — Telephone Encounter (Signed)
Initial Comment Caller states she has been having chest pain. She does not know if it from moving furniture or not. GOTO Facility Not Listed [facility] Translation No Nurse Assessment Nurse: Thana Farr, RN, Ladona Ridgel Date/Time (Eastern Time): 01/22/2023 10:43:23 AM Confirm and document reason for call. If symptomatic, describe symptoms. ---Chest pain started on Saturday. Pain is on and off. Current pain level 5/10. Does the patient have any new or worsening symptoms? ---Yes Will a triage be completed? ---Yes Related visit to physician within the last 2 weeks? ---No Does the PT have any chronic conditions? (i.e. diabetes, asthma, this includes High risk factors for pregnancy, etc.) ---Yes List chronic conditions. ---HTN, Diabetes Is this a behavioral health or substance abuse call? ---No Guidelines Guideline Title Affirmed Question Affirmed Notes Nurse Date/Time (Eastern Time) Chest Pain [1] Chest pain lasts > 5 minutes AND [2] occurred in past 3 days (72 hours) (Exception: Feels exactly the same as previously diagnosed heartburn and has accompanying sour taste in mouth.) Thana Farr, RN, Ladona Ridgel 01/22/2023 10:44:08 AM PLEASE NOTE: All timestamps contained within this report are represented as [place] Standard Time. CONFIDENTIALTY NOTICE: This fax transmission is intended only for the addressee. It contains information that is legally privileged, confidential or otherwise protected from use or disclosure. If you are not the intended recipient, you are strictly prohibited from reviewing, disclosing, copying using or disseminating any of this information or taking any action in reliance on or regarding this information. If you have received this fax in error, please notify us immediately by telephone so that we can arrange for its return to [place]. Phone: [phone], Toll-Free: [phone], Fax: [phone] Page: 2 of 2 Call Id: [id number] Disp. Time Lamount Cohen Time) Disposition Final  User 01/22/2023 10:47:16 AM Go to ED Now (or PCP triage) Yes Thana Farr, RN, Ladona Ridgel Final Disposition 01/22/2023 10:47:16 AM Go to ED Now (or PCP triage) Yes Thana Farr, RN, Ursula Beath Disagree/Comply Comply Caller Understands Yes PreDisposition Call Doctor Care Advice Given Per Guideline GO TO ED NOW (OR PCP TRIAGE): * IF NO PCP (PRIMARY CARE PROVIDER) SECOND-LEVEL TRIAGE: You need to be seen within the next hour. Go to the ED/UCC at _____________ Hospital. Leave as soon as you can. Referrals GO TO FACILITY OTHER - SPECIFY

## 2023-01-23 ENCOUNTER — Telehealth: Payer: Self-pay | Admitting: Family

## 2023-01-23 ENCOUNTER — Ambulatory Visit (INDEPENDENT_AMBULATORY_CARE_PROVIDER_SITE_OTHER): Payer: Medicare PPO | Admitting: Family

## 2023-01-23 VITALS — BP 128/58 | HR 72 | Temp 98.1°F | Wt 227.0 lb

## 2023-01-23 DIAGNOSIS — M7918 Myalgia, other site: Secondary | ICD-10-CM | POA: Diagnosis not present

## 2023-01-23 NOTE — Patient Instructions (Signed)
During your visit, we discussed the left-sided chest pain you've been experiencing since moving a mattress. You mentioned that the pain is located under your left breast and is tender to touch. You also noted that the pain has been improving and was relieved with Tylenol. We believe this pain is likely due to a muscle strain. Additionally, we discussed your upcoming eye exam with your ophthalmologist.  YOUR PLAN:  -CHEST PAIN: Your chest pain is likely due to a muscle strain, which is a common injury that occurs when a muscle is overstretched or torn. It's recommended that you continue taking Tylenol as needed for pain relief.  -ROUTINE EYE EXAM: You mentioned your last eye exam was at [facility], but the results were not sent to [place]. We will request these records from [facility] to ensure we have all your health information.  INSTRUCTIONS:  Your next appointment is scheduled for July 2024. At that time, we plan to review your lab results. In the meantime, continue taking Tylenol as needed for your chest pain.  We will request a copy of your eye exam from [facility].

## 2023-01-23 NOTE — Telephone Encounter (Signed)
Patient was seen. 

## 2023-01-23 NOTE — Assessment & Plan Note (Signed)
History and exam consistent with muscle strain.  Pain is improving. Advised pt to continue tylenol prn and let us know if symptoms do not continue to improve.

## 2023-01-23 NOTE — Progress Notes (Addendum)
Subjective:     Patient ID: Nancy Deleon, female    DOB: [birth year], 67 y.o.   MRN: [id number]  Chief Complaint  Patient presents with   Chest Pain    Patient reports chest pain on left side after straining Saturday. Worse with movement, better with tylenol.     Chest Pain     Discussed the use of AI scribe software for clinical note transcription with the patient, who gave verbal consent to proceed.  History of Present Illness   The patient presents with left-sided chest pain that began on Saturday after moving a mattress on Friday. The pain is located under the left breast and is tender to touch. The patient reports that the pain has been improving and was relieved with Tylenol. She denies any associated shortness of breath. The patient sought medical attention due to the insistence of others, despite her belief that the pain was due to a muscle pull.  In addition, the patient mentions an upcoming eye exam with her ophthalmologist, which is unrelated to the current complaint.          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    Past Surgical History:  Procedure Laterality Date   BIOPSY THYROID     BREAST BIOPSY  04/24/11   rigth breast   BREAST EXCISIONAL BIOPSY     BREAST LUMPECTOMY WITH RADIOACTIVE SEED LOCALIZATION Left 05/01/2017   Procedure: LEFT BREAST LUMPECTOMY WITH RADIOACTIVE SEED LOCALIZATION;  Surgeon: Manus Rudd, MD;  Location: [facility];  Service: General;   Laterality: Left;   CESAREAN SECTION  [id number]   COLONOSCOPY  2011   [facility]-  normal   toenail removal  09/2020   Dr. Marylene Land   TUBAL LIGATION     UPPER GASTROINTESTINAL ENDOSCOPY      Family History  Problem Relation Age of Onset   Lymphoma Mother    Dementia Mother    Cancer Father        lung   Colon cancer Maternal Uncle    Esophageal cancer Neg Hx    Pancreatic cancer Neg Hx    Rectal cancer Neg Hx    Stomach cancer Neg Hx    Thyroid disease Neg Hx    Colon polyps Neg Hx     Social History   Socioeconomic History   Marital status: Widowed    Spouse name: Not on file   Number of children: 2   Years of education: Not on file   Highest education level: Not on file  Occupational History    Employer: [employer]  Tobacco Use   Smoking status: Never   Smokeless tobacco: Never  Vaping Use   Vaping Use: Never used  Substance and Sexual Activity   Alcohol use: Yes    Alcohol/week: 0.0 standard drinks of alcohol    Comment: occasional wine   Drug use: No   Sexual activity: Not Currently    Partners: Male  Birth control/protection: Post-menopausal    Comment: 1st intercourse 67 yo-Fewer than 5 partners  Other Topics Concern   Not on file  Social History Narrative   Regular exercise:  Walks 2-5 x weekly   Caffeine Use: no   Widowed   [name]- he is 15 yrs old.   Foster parent.   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   Dog (Lab named Abby)         Social Determinants of Health   Financial Resource Strain: Low Risk  (10/12/2022)   Overall Financial Resource Strain (CARDIA)    Difficulty of Paying Living Expenses: Not hard at all  Food Insecurity: No Food Insecurity (10/12/2022)   Hunger Vital Sign    Worried About Running Out of Food in the Last Year: Never true    Ran Out of Food in the Last Year: Never true  Transportation Needs: No Transportation Needs (10/12/2022)   PRAPARE  - Administrator, Civil Service (Medical): No    Lack of Transportation (Non-Medical): No  Physical Activity: Insufficiently Active (10/12/2022)   Exercise Vital Sign    Days of Exercise per Week: 2 days    Minutes of Exercise per Session: 60 min  Stress: No Stress Concern Present (10/12/2022)   [employer] of Occupational Health - Occupational Stress Questionnaire    Feeling of Stress : Not at all  Social Connections: Moderately Integrated (10/12/2022)   Social Connection and Isolation Panel [NHANES]    Frequency of Communication with Friends and Family: More than three times a week    Frequency of Social Gatherings with Friends and Family: Twice a week    Attends Religious Services: More than 4 times per year    Active Member of [employer] or Organizations: Yes    Attends Banker Meetings: More than 4 times per year    Marital Status: Widowed  Intimate Partner Violence: Not At Risk (10/12/2022)   Humiliation, Afraid, Rape, and Kick questionnaire    Fear of Current or Ex-Partner: No    Emotionally Abused: No    Physically Abused: No    Sexually Abused: No    Outpatient Medications Prior to Visit  Medication Sig Dispense Refill   acetaminophen (TYLENOL) 500 MG tablet Take 1,000 mg by mouth every 6 (six) hours as needed.     amLODipine (NORVASC) 10 MG tablet Take 1 tablet (10 mg total) by mouth daily. 90 tablet 1   aspirin EC 81 MG tablet Take 1 tablet (81 mg total) by mouth daily. Swallow whole. 30 tablet 12   atorvastatin (LIPITOR) 10 MG tablet Take 1 tablet (10 mg total) by mouth daily. 90 tablet 1   Blood Glucose Monitoring Suppl DEVI 1 each by Does not apply route in the morning, at noon, and at bedtime. May substitute to any manufacturer covered by patient's insurance. 1 each 0   calcium-vitamin D (OSCAL WITH D) 500-200 MG-UNIT tablet Take 1 tablet by mouth.     cetirizine (ZYRTEC) 10 MG tablet Take 1 tablet (10 mg total) by mouth daily. 90 tablet 4    esomeprazole (NEXIUM) 20 MG capsule Take 1 capsule (20 mg total) by mouth daily as needed.     Multiple Vitamins-Minerals (CENTRUM ULTRA WOMENS) TABS Take 1 tablet by mouth daily.     sitaGLIPtin (JANUVIA) 50 MG tablet TAKE 1 TABLET DAILY 90 tablet 1  valsartan (DIOVAN) 80 MG tablet Take 1 tablet (80 mg total) by mouth daily. 90 tablet 1   No facility-administered medications prior to visit.    Allergies  Allergen Reactions   Lisinopril     cough   Metformin And Related Other (See Comments)    nausea   Naproxen Nausea And Vomiting    REACTION: vomiting   Ropinirole Nausea Only   Valsartan     Cough    Vicodin [Hydrocodone-Acetaminophen] Nausea And Vomiting   Propoxyphene N-Acetaminophen Nausea And Vomiting    Review of Systems  Cardiovascular:  Positive for chest pain.       Objective:    Physical Exam Constitutional:      General: She is not in acute distress.    Appearance: Normal appearance. She is well-developed.  HENT:     Head: Normocephalic and atraumatic.     Right Ear: External ear normal.     Left Ear: External ear normal.  Eyes:     General: No scleral icterus. Neck:     Thyroid: No thyromegaly.  Cardiovascular:     Rate and Rhythm: Normal rate and regular rhythm.     Heart sounds: Normal heart sounds. No murmur heard. Pulmonary:     Effort: Pulmonary effort is normal. No respiratory distress.     Breath sounds: Normal breath sounds. No wheezing.  Musculoskeletal:     Cervical back: Neck supple.     Comments: Reproducible tenderness to palpation beneath the left breast  Skin:    General: Skin is warm and dry.  Neurological:     Mental Status: She is alert and oriented to person, place, and time.  Psychiatric:        Mood and Affect: Mood normal.        Behavior: Behavior normal.        Thought Content: Thought content normal.        Judgment: Judgment normal.      BP (!) 128/58 (BP Location: Right Arm, Patient Position: Sitting, Cuff Size:  Large)   Pulse 72   Temp 98.1 F (36.7 C) (Oral)   Wt 227 lb (103 kg)   LMP 02/28/2007   SpO2 99%   BMI 35.55 kg/m  Wt Readings from Last 3 Encounters:  01/23/23 227 lb (103 kg)  11/24/22 227 lb (103 kg)  08/18/22 225 lb (102.1 kg)       Assessment & Plan:   Problem List Items Addressed This Visit       Unprioritized   Musculoskeletal pain - Primary    History and exam consistent with muscle strain.  Pain is improving. Advised pt to continue tylenol prn and let us know if symptoms do not continue to improve.        I am having Abyssinia Bielawski. [place] maintain her calcium-vitamin D, Centrum Ultra Womens, acetaminophen, aspirin EC, Blood Glucose Monitoring Suppl, esomeprazole, valsartan, cetirizine, amLODipine, atorvastatin, and Januvia.  No orders of the defined types were placed in this encounter.

## 2023-01-23 NOTE — Telephone Encounter (Signed)
Request sent electronically

## 2023-01-23 NOTE — Telephone Encounter (Signed)
Please call [facility] and request a copy of last DM eye exam.

## 2023-02-21 ENCOUNTER — Telehealth: Payer: Self-pay | Admitting: Family

## 2023-02-21 NOTE — Telephone Encounter (Signed)
Patient advised we do not do skin tb test placements on Friday. She can have  a tb gold if provider approves while she is here for ov Friday or she can have tb skin test placement next week

## 2023-02-21 NOTE — Telephone Encounter (Signed)
Pt wants to have TB SKIN TEST while she is here on Friday and would like order to be placed.

## 2023-02-23 ENCOUNTER — Ambulatory Visit (INDEPENDENT_AMBULATORY_CARE_PROVIDER_SITE_OTHER): Payer: Medicare PPO | Admitting: Family

## 2023-02-23 ENCOUNTER — Telehealth: Payer: Self-pay | Admitting: Family

## 2023-02-23 VITALS — BP 126/50 | HR 63 | Temp 98.2°F | Resp 16 | Wt 228.0 lb

## 2023-02-23 DIAGNOSIS — I10 Essential (primary) hypertension: Secondary | ICD-10-CM

## 2023-02-23 DIAGNOSIS — L304 Erythema intertrigo: Secondary | ICD-10-CM | POA: Diagnosis not present

## 2023-02-23 DIAGNOSIS — E1129 Type 2 diabetes mellitus with other diabetic kidney complication: Secondary | ICD-10-CM

## 2023-02-23 DIAGNOSIS — E785 Hyperlipidemia, unspecified: Secondary | ICD-10-CM

## 2023-02-23 DIAGNOSIS — R809 Proteinuria, unspecified: Secondary | ICD-10-CM

## 2023-02-23 MED ORDER — NYSTATIN 100000 UNIT/GM EX POWD: 1.0000 | Freq: Three times a day (TID) | CUTANEOUS | 0 refills | Status: DC

## 2023-02-23 NOTE — Progress Notes (Signed)
Subjective:     Patient ID: Nancy Deleon, female    DOB: [birth year], 67 y.o.   MRN: [id number]  Chief Complaint  Patient presents with   Diabetes    Here for follow up   Hypertension    Here for follow up   Rash    Complains of having a rash under left breast    HPI  Discussed the use of AI scribe software for clinical note transcription with the patient, who gave verbal consent to proceed.  History of Present Illness   The patient, with a history of hypertension, diabetes, and hyperlipidemia, presents for a routine follow-up. She reports good adherence to her current medication regimen, including amlodipine 10mg  and Diovan 80mg  for hypertension, and Januvia for diabetes. Her blood pressure is well-controlled at the current visit. She has made some dietary modifications and continues to participate in Zumba classes, although her instructor is currently on vacation. Her most recent A1c showed improvement, decreasing from 7.3 to 6.5.  The patient also mentions a rash under her breast, which she describes as mild. She has been experiencing some discomfort due to the rash, particularly with the recent heat.          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    Past Surgical History:  Procedure Laterality Date   BIOPSY THYROID     BREAST BIOPSY  04/24/11   rigth breast   BREAST EXCISIONAL BIOPSY     BREAST LUMPECTOMY WITH RADIOACTIVE SEED LOCALIZATION Left 05/01/2017   Procedure: LEFT BREAST  LUMPECTOMY WITH RADIOACTIVE SEED LOCALIZATION;  Surgeon: Manus Rudd, MD;  Location: [facility];  Service: General;  Laterality: Left;   CESAREAN SECTION  [id number]   COLONOSCOPY  2011   [facility]-  normal   toenail removal  09/2020   Dr. Marylene Land   TUBAL LIGATION     UPPER GASTROINTESTINAL ENDOSCOPY      Family History  Problem Relation Age of Onset   Lymphoma Mother    Dementia Mother    Cancer Father        lung   Colon cancer Maternal Uncle    Esophageal cancer Neg Hx    Pancreatic cancer Neg Hx    Rectal cancer Neg Hx    Stomach cancer Neg Hx    Thyroid disease Neg Hx    Colon polyps Neg Hx     Social History   Socioeconomic History   Marital status: Widowed    Spouse name: Not on file   Number of children: 2   Years of education: Not on file   Highest education level: Not on file  Occupational History    Employer: [employer]  Tobacco Use   Smoking status: Never   Smokeless tobacco: Never  Vaping Use   Vaping status: Never Used  Substance and Sexual Activity   Alcohol use: Yes    Alcohol/week: 0.0 standard drinks of alcohol    Comment: occasional wine  Drug use: No   Sexual activity: Not Currently    Partners: Male    Birth control/protection: Post-menopausal    Comment: 1st intercourse 67 yo-Fewer than 5 partners  Other Topics Concern   Not on file  Social History Narrative   Regular exercise:  Walks 2-5 x weekly   Caffeine Use: no   Widowed   She has a son Nancy Deleon- he is 13 yrs old.   Foster parent.   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   Dog (Lab named Nancy Deleon)         Social Determinants of Health   Financial Resource Strain: Low Risk  (10/12/2022)   Overall Financial Resource Strain (CARDIA)    Difficulty of Paying Living Expenses: Not hard at all  Food Insecurity: No Food Insecurity (10/12/2022)   Hunger Vital Sign    Worried About Running Out of Food in the  Last Year: Never true    Ran Out of Food in the Last Year: Never true  Transportation Needs: No Transportation Needs (10/12/2022)   PRAPARE - Administrator, Civil Service (Medical): No    Lack of Transportation (Non-Medical): No  Physical Activity: Insufficiently Active (10/12/2022)   Exercise Vital Sign    Days of Exercise per Week: 2 days    Minutes of Exercise per Session: 60 min  Stress: No Stress Concern Present (10/12/2022)   [employer] of Occupational Health - Occupational Stress Questionnaire    Feeling of Stress : Not at all  Social Connections: Moderately Integrated (10/12/2022)   Social Connection and Isolation Panel [NHANES]    Frequency of Communication with Friends and Family: More than three times a week    Frequency of Social Gatherings with Friends and Family: Twice a week    Attends Religious Services: More than 4 times per year    Active Member of [employer] or Organizations: Yes    Attends Banker Meetings: More than 4 times per year    Marital Status: Widowed  Intimate Partner Violence: Not At Risk (10/12/2022)   Humiliation, Afraid, Rape, and Kick questionnaire    Fear of Current or Ex-Partner: No    Emotionally Abused: No    Physically Abused: No    Sexually Abused: No    Outpatient Medications Prior to Visit  Medication Sig Dispense Refill   acetaminophen (TYLENOL) 500 MG tablet Take 1,000 mg by mouth every 6 (six) hours as needed.     amLODipine (NORVASC) 10 MG tablet Take 1 tablet (10 mg total) by mouth daily. 90 tablet 1   aspirin EC 81 MG tablet Take 1 tablet (81 mg total) by mouth daily. Swallow whole. 30 tablet 12   atorvastatin (LIPITOR) 10 MG tablet Take 1 tablet (10 mg total) by mouth daily. 90 tablet 1   Blood Glucose Monitoring Suppl DEVI 1 each by Does not apply route in the morning, at noon, and at bedtime. May substitute to any manufacturer covered by patient's insurance. 1 each 0   calcium-vitamin D (OSCAL WITH D) 500-200  MG-UNIT tablet Take 1 tablet by mouth.     cetirizine (ZYRTEC) 10 MG tablet Take 1 tablet (10 mg total) by mouth daily. 90 tablet 4   esomeprazole (NEXIUM) 20 MG capsule Take 1 capsule (20 mg total) by mouth daily as needed.     Multiple Vitamins-Minerals (CENTRUM ULTRA WOMENS) TABS Take 1 tablet by mouth daily.  sitaGLIPtin (JANUVIA) 50 MG tablet TAKE 1 TABLET DAILY 90 tablet 1   valsartan (DIOVAN) 80 MG tablet Take 1 tablet (80 mg total) by mouth daily. 90 tablet 1   No facility-administered medications prior to visit.    Allergies  Allergen Reactions   Lisinopril     cough   Metformin And Related Other (See Comments)    nausea   Naproxen Nausea And Vomiting    REACTION: vomiting   Ropinirole Nausea Only   Valsartan     Cough    Vicodin [Hydrocodone-Acetaminophen] Nausea And Vomiting   Propoxyphene N-Acetaminophen Nausea And Vomiting    ROS See HPI Objective:    Physical Exam Constitutional:      General: She is not in acute distress.    Appearance: Normal appearance. She is well-developed.  HENT:     Head: Normocephalic and atraumatic.     Right Ear: External ear normal.     Left Ear: External ear normal.  Eyes:     General: No scleral icterus. Neck:     Thyroid: No thyromegaly.  Cardiovascular:     Rate and Rhythm: Normal rate and regular rhythm.     Heart sounds: Normal heart sounds. No murmur heard. Pulmonary:     Effort: Pulmonary effort is normal. No respiratory distress.     Breath sounds: Normal breath sounds. No wheezing.  Musculoskeletal:     Cervical back: Neck supple.  Skin:    General: Skin is warm and dry.     Comments: Hyperpigmented rash under left breast  Neurological:     Mental Status: She is alert and oriented to person, place, and time.  Psychiatric:        Mood and Affect: Mood normal.        Behavior: Behavior normal.        Thought Content: Thought content normal.        Judgment: Judgment normal.      BP (!) 126/50 (BP  Location: Right Arm, Patient Position: Sitting, Cuff Size: Large)   Pulse 63   Temp 98.2 F (36.8 C) (Oral)   Resp 16   Wt 228 lb (103.4 kg)   LMP 02/28/2007   SpO2 99%   BMI 35.71 kg/m  Wt Readings from Last 3 Encounters:  02/23/23 228 lb (103.4 kg)  01/23/23 227 lb (103 kg)  11/24/22 227 lb (103 kg)       Assessment & Plan:   Problem List Items Addressed This Visit       Unprioritized   Type 2 diabetes mellitus, controlled, with renal complications (HCC)    Lab Results  Component Value Date   HGBA1C 6.5 11/24/2022   HGBA1C 7.3 (H) 08/02/2022   HGBA1C 7.0 (H) 04/28/2022   Lab Results  Component Value Date   MICROALBUR 6.3 (H) 04/28/2022   LDLCALC 63 04/28/2022   CREATININE 0.91 11/24/2022   At goal on januvia. Continue same.       Relevant Orders   HgB A1c   Intertrigo     -INTERTRIGO: The rash under your breast is likely due to a yeast infection caused by heat and moisture, a condition known as intertrigo. We will prescribe an antifungal powder for you to apply under both breasts.      Hyperlipidemia    Lab Results  Component Value Date   CHOL 137 04/28/2022   HDL 59.30 04/28/2022   LDLCALC 63 04/28/2022   TRIG 74.0 04/28/2022   CHOLHDL 2 04/28/2022  Lipids at goal, continue atorvastatin      Relevant Orders   Lipid panel   Essential hypertension - Primary    Stable on diovan/amlodipine, continue same.       Relevant Orders   Comp Met (CMET)    I am having Orlean Bradford. [place] start on nystatin. I am also having her maintain her calcium-vitamin D, Centrum Ultra Womens, acetaminophen, aspirin EC, Blood Glucose Monitoring Suppl, esomeprazole, valsartan, cetirizine, amLODipine, atorvastatin, and Januvia.  Meds ordered this encounter  Medications   nystatin (MYCOSTATIN/NYSTOP) powder    Sig: Apply 1 Application topically 3 (three) times daily.    Dispense:  30 g    Refill:  0    Order Specific Question:   Supervising Provider    Answer:    Danise Edge A [[id number]]

## 2023-02-23 NOTE — Assessment & Plan Note (Signed)
Stable on diovan/amlodipine, continue same.

## 2023-02-23 NOTE — Patient Instructions (Signed)
VISIT SUMMARY:  During your recent visit, we discussed your ongoing management of hypertension, diabetes, and hyperlipidemia. Your blood pressure is well-controlled, and your diabetes has shown improvement. You also mentioned a rash under your breast, which is likely due to a yeast infection caused by heat and moisture.  YOUR PLAN:  -HYPERTENSION: Your high blood pressure is well-managed with your current medications, Amlodipine and Diovan. We will continue with this treatment plan.  -TYPE 2 DIABETES MELLITUS: Your blood sugar levels have improved, as shown by your A1c decreasing from 7.3 to 6.5. This is due to your medication, Januvia, and the dietary changes you've made. We will continue with this treatment and encourage further dietary modifications.  -HYPERLIPIDEMIA: Hyperlipidemia is a condition where there are high levels of fats in your blood. We didn't discuss your recent lipid panel, so we will order one today to check your levels.  -INTERTRIGO: The rash under your breast is likely due to a yeast infection caused by heat and moisture, a condition known as intertrigo. We will prescribe an antifungal powder for you to apply under both breasts.  -GENERAL HEALTH MAINTENANCE: Your last eye exam was last year. We will request records from your optometrist and check back in six months for a general health review.  INSTRUCTIONS:  Please apply the prescribed antifungal powder under both breasts to treat the rash. Continue taking your current medications and maintain your dietary changes. We will order a lipid panel to check your cholesterol levels. We will also request your eye exam records from your optometrist. Please return in six months for a general health review.

## 2023-02-23 NOTE — Assessment & Plan Note (Addendum)
Lab Results  Component Value Date   CHOL 137 04/28/2022   HDL 59.30 04/28/2022   LDLCALC 63 04/28/2022   TRIG 74.0 04/28/2022   CHOLHDL 2 04/28/2022   Lipids at goal, continue atorvastatin

## 2023-02-23 NOTE — Assessment & Plan Note (Addendum)
New, rx nystatin powder.

## 2023-02-23 NOTE — Telephone Encounter (Signed)
Please call [facility] and request DM eye exam.

## 2023-02-23 NOTE — Telephone Encounter (Signed)
Request sent electronically

## 2023-02-23 NOTE — Assessment & Plan Note (Signed)
Lab Results  Component Value Date   HGBA1C 6.5 11/24/2022   HGBA1C 7.3 (H) 08/02/2022   HGBA1C 7.0 (H) 04/28/2022   Lab Results  Component Value Date   MICROALBUR 6.3 (H) 04/28/2022   LDLCALC 63 04/28/2022   CREATININE 0.91 11/24/2022   At goal on januvia. Continue same.

## 2023-02-24 LAB — COMPREHENSIVE METABOLIC PANEL
AG Ratio: 1.7 (calc) (ref 1.0–2.5)
ALT: 9 U/L (ref 6–29)
AST: 11 U/L (ref 10–35)
Albumin: 4.4 g/dL (ref 3.6–5.1)
Alkaline phosphatase (APISO): 66 U/L (ref 37–153)
BUN: 16 mg/dL (ref 7–25)
CO2: 27 mmol/L (ref 20–32)
Calcium: 9.8 mg/dL (ref 8.6–10.4)
Chloride: 106 mmol/L (ref 98–110)
Creat: 0.86 mg/dL (ref 0.50–1.05)
Globulin: 2.6 g/dL (calc) (ref 1.9–3.7)
Glucose, Bld: 156 mg/dL — ABNORMAL HIGH (ref 65–99)
Potassium: 3.8 mmol/L (ref 3.5–5.3)
Sodium: 140 mmol/L (ref 135–146)
Total Bilirubin: 0.9 mg/dL (ref 0.2–1.2)
Total Protein: 7 g/dL (ref 6.1–8.1)

## 2023-02-24 LAB — LIPID PANEL
Cholesterol: 147 mg/dL (ref <200)
HDL: 57 mg/dL (ref >=50)
LDL Cholesterol (Calc): 75 mg/dL (calc)
Non-HDL Cholesterol (Calc): 90 mg/dL (calc) (ref <130)
Total CHOL/HDL Ratio: 2.6 (calc) (ref <5.0)
Triglycerides: 70 mg/dL (ref <150)

## 2023-02-24 LAB — HEMOGLOBIN A1C
Hgb A1c MFr Bld: 6.4 % of total Hgb — ABNORMAL HIGH (ref <5.7)
Mean Plasma Glucose: 137 mg/dL
eAG (mmol/L): 7.6 mmol/L

## 2023-04-04 ENCOUNTER — Other Ambulatory Visit: Payer: Self-pay | Admitting: Family

## 2023-04-23 ENCOUNTER — Encounter: Payer: Self-pay | Admitting: Family

## 2023-04-23 DIAGNOSIS — H40013 Open angle with borderline findings, low risk, bilateral: Secondary | ICD-10-CM | POA: Diagnosis not present

## 2023-04-23 DIAGNOSIS — H43812 Vitreous degeneration, left eye: Secondary | ICD-10-CM | POA: Diagnosis not present

## 2023-04-23 DIAGNOSIS — H34232 Retinal artery branch occlusion, left eye: Secondary | ICD-10-CM | POA: Diagnosis not present

## 2023-04-23 LAB — HM DIABETES EYE EXAM

## 2023-06-11 ENCOUNTER — Other Ambulatory Visit: Payer: Self-pay | Admitting: Family

## 2023-07-09 ENCOUNTER — Other Ambulatory Visit: Payer: Self-pay | Admitting: Family

## 2023-07-09 DIAGNOSIS — E1129 Type 2 diabetes mellitus with other diabetic kidney complication: Secondary | ICD-10-CM

## 2023-08-27 ENCOUNTER — Ambulatory Visit: Payer: Medicare PPO | Admitting: Family

## 2023-08-29 ENCOUNTER — Ambulatory Visit: Payer: Medicare PPO | Admitting: Family

## 2023-08-29 VITALS — BP 118/59 | HR 72 | Temp 98.7°F | Resp 16 | Ht 67.0 in | Wt 230.0 lb

## 2023-08-29 DIAGNOSIS — Z7984 Long term (current) use of oral hypoglycemic drugs: Secondary | ICD-10-CM

## 2023-08-29 DIAGNOSIS — H34232 Retinal artery branch occlusion, left eye: Secondary | ICD-10-CM

## 2023-08-29 DIAGNOSIS — D509 Iron deficiency anemia, unspecified: Secondary | ICD-10-CM | POA: Diagnosis not present

## 2023-08-29 DIAGNOSIS — I10 Essential (primary) hypertension: Secondary | ICD-10-CM

## 2023-08-29 DIAGNOSIS — R809 Proteinuria, unspecified: Secondary | ICD-10-CM

## 2023-08-29 DIAGNOSIS — F419 Anxiety disorder, unspecified: Secondary | ICD-10-CM

## 2023-08-29 DIAGNOSIS — K219 Gastro-esophageal reflux disease without esophagitis: Secondary | ICD-10-CM | POA: Diagnosis not present

## 2023-08-29 DIAGNOSIS — E1129 Type 2 diabetes mellitus with other diabetic kidney complication: Secondary | ICD-10-CM

## 2023-08-29 DIAGNOSIS — E785 Hyperlipidemia, unspecified: Secondary | ICD-10-CM | POA: Diagnosis not present

## 2023-08-29 LAB — CBC WITH DIFFERENTIAL/PLATELET
Basophils Absolute: 0.1 10*3/uL (ref 0.0–0.1)
Basophils Relative: 1.2 % (ref 0.0–3.0)
Eosinophils Absolute: 0.4 10*3/uL (ref 0.0–0.7)
Eosinophils Relative: 4.7 % (ref 0.0–5.0)
HCT: 37.5 % (ref 36.0–46.0)
Hemoglobin: 12.8 g/dL (ref 12.0–15.0)
Lymphocytes Relative: 28.7 % (ref 12.0–46.0)
Lymphs Abs: 2.2 10*3/uL (ref 0.7–4.0)
MCHC: 34 g/dL (ref 30.0–36.0)
MCV: 74.9 fL — ABNORMAL LOW (ref 78.0–100.0)
Monocytes Absolute: 0.5 10*3/uL (ref 0.1–1.0)
Monocytes Relative: 6 % (ref 3.0–12.0)
Neutro Abs: 4.6 10*3/uL (ref 1.4–7.7)
Neutrophils Relative %: 59.4 % (ref 43.0–77.0)
Platelets: 187 10*3/uL (ref 150.0–400.0)
RBC: 5.01 Mil/uL (ref 3.87–5.11)
RDW: 13.9 % (ref 11.5–15.5)
WBC: 7.8 10*3/uL (ref 4.0–10.5)

## 2023-08-29 LAB — BASIC METABOLIC PANEL
BUN: 14 mg/dL (ref 6–23)
CO2: 31 meq/L (ref 19–32)
Calcium: 9.7 mg/dL (ref 8.4–10.5)
Chloride: 104 meq/L (ref 96–112)
Creatinine, Ser: 0.94 mg/dL (ref 0.40–1.20)
GFR: 63.09 mL/min (ref >60.00)
Glucose, Bld: 152 mg/dL — ABNORMAL HIGH (ref 70–99)
Potassium: 3.9 meq/L (ref 3.5–5.1)
Sodium: 143 meq/L (ref 135–145)

## 2023-08-29 LAB — HEMOGLOBIN A1C: Hgb A1c MFr Bld: 6.9 % — ABNORMAL HIGH (ref 4.6–6.5)

## 2023-08-29 LAB — MICROALBUMIN / CREATININE URINE RATIO
Creatinine,U: 244.9 mg/dL
Microalb Creat Ratio: 2.4 mg/g (ref 0.0–30.0)
Microalb, Ur: 5.9 mg/dL — ABNORMAL HIGH (ref 0.0–1.9)

## 2023-08-29 NOTE — Assessment & Plan Note (Signed)
Lab Results  Component Value Date   CHOL 147 02/23/2023   HDL 57 02/23/2023   LDLCALC 75 02/23/2023   TRIG 70 02/23/2023   CHOLHDL 2.6 02/23/2023   Update lipid.

## 2023-08-29 NOTE — Assessment & Plan Note (Signed)
Reports that her anxiety is well controlled despite multiple deaths in the family.

## 2023-08-29 NOTE — Assessment & Plan Note (Signed)
Lab Results  Component Value Date   WBC 5.6 08/02/2022   HGB 13.0 08/02/2022   HCT 38.0 08/02/2022   MCV 74.4 (L) 08/02/2022   PLT 181.0 08/02/2022

## 2023-08-29 NOTE — Assessment & Plan Note (Signed)
Continue aspirin 81mg  for secondary prevention.

## 2023-08-29 NOTE — Patient Instructions (Signed)
VISIT SUMMARY:  You came in today for a routine follow-up visit. We discussed your ongoing management of diabetes, hyperlipidemia, hypertension, and gastroesophageal reflux disease. You mentioned that your diet has been a bit off due to recent travel, but you are managing your medications well. Your blood pressure is well-controlled, and you have not experienced any reflux symptoms recently. We also talked about getting your COVID-19 booster shot.  YOUR PLAN:  -TYPE 2 DIABETES MELLITUS: Type 2 Diabetes Mellitus is a condition where your body does not use insulin properly, leading to high blood sugar levels. Your last A1c was 6.4 in July, which is good. Continue taking Januvia 50mg  daily. We will check your A1c and kidney function today.  -HYPERLIPIDEMIA: Hyperlipidemia means you have high levels of fats (lipids) in your blood, which can increase your risk of heart disease. Your cholesterol is well controlled with Atorvastatin. Continue taking Atorvastatin as prescribed.  -HYPERTENSION: Hypertension is high blood pressure, which can lead to serious health problems if not managed. Your blood pressure is well controlled with Amlodipine and Diovan. Continue taking these medications as prescribed.  -GASTROESOPHAGEAL REFLUX DISEASE: Gastroesophageal Reflux Disease (GERD) is a condition where stomach acid frequently flows back into the tube connecting your mouth and stomach, causing discomfort. You have not had any recent symptoms and are using Nexium as needed. Continue to take Nexium when necessary and avoid spicy foods.  -GENERAL HEALTH MAINTENANCE: For your general health, consider getting a COVID-19 booster shot at your local pharmacy. We will also check your complete blood count today.  INSTRUCTIONS:  Please schedule a follow-up appointment in 3 months.

## 2023-08-29 NOTE — Assessment & Plan Note (Signed)
Lab Results  Component Value Date   HGBA1C 6.4 (H) 02/23/2023   HGBA1C 6.5 11/24/2022   HGBA1C 7.3 (H) 08/02/2022   Lab Results  Component Value Date   MICROALBUR 6.3 (H) 04/28/2022   LDLCALC 75 02/23/2023   CREATININE 0.86 02/23/2023   Continue januvia, update A1C.

## 2023-08-29 NOTE — Progress Notes (Signed)
Subjective:     Patient ID: Nancy Deleon, female    DOB: February 17, 1956, 68 y.o.   MRN: [id number]  Chief Complaint  Patient presents with   Hypertension    Here for follow up   Diabetes    Here for follow up     Discussed the use of AI scribe software for clinical note transcription with the patient, who gave verbal consent to proceed.  History of Present Illness   The patient, with a history of diabetes, hyperlipidemia, hypertension, and gastroesophageal reflux disease, presents for a routine follow-up. She reports compliance with her current medication regimen, including Januvia 50mg  for diabetes, baby aspirin, atorvastatin for hyperlipidemia, amlodipine and Diovan for hypertension, and Nexium as needed for reflux symptoms. She has been managing her diet, but admits it has been 'out of whack' due to recent travel to [place]. She has not been experiencing any reflux symptoms and has not needed to use Nexium for a while. She plans to quit drinking coffee. Her blood pressure is well-controlled on her current regimen. She denies any current concerns about anxiety, despite recent family deaths. She has not yet received the COVID booster.          Health Maintenance Due  Topic Date Due   COVID-19 Vaccine (5 - 2024-25 season) 04/08/2023   Diabetic kidney evaluation - Urine ACR  04/29/2023   HEMOGLOBIN A1C  08/26/2023   Medicare Annual Wellness (AWV)  10/12/2023    Past Medical History:  Diagnosis Date   Allergy    Fatty liver 04/03/2014   GERD (gastroesophageal reflux disease)    Hypertension    Intraductal papilloma of breast 08/15/2017   Multiple thyroid nodules 08/20/2017   Obesity    Type 2 diabetes mellitus, controlled, with renal complications (HCC) 03/13/2018   12/18/19-pt states she does not have diabetes, just checks blood sugar as needed.    Past Surgical History:  Procedure Laterality Date   BIOPSY THYROID     BREAST BIOPSY  04/24/11   [facility]   BREAST  EXCISIONAL BIOPSY     BREAST LUMPECTOMY WITH RADIOACTIVE SEED LOCALIZATION Left 05/01/2017   Procedure: LEFT BREAST LUMPECTOMY WITH RADIOACTIVE SEED LOCALIZATION;  Surgeon: Manus Rudd, MD;  Location: [facility];  Service: General;  Laterality: Left;   CESAREAN SECTION  [id number]   COLONOSCOPY  2011   [facility]-  normal   toenail removal  09/2020   Dr. Marylene Land   TUBAL LIGATION     UPPER GASTROINTESTINAL ENDOSCOPY      Family History  Problem Relation Age of Onset   Lymphoma Mother    Dementia Mother    Cancer Father        lung   Colon cancer Maternal Uncle    Esophageal cancer Neg Hx    Pancreatic cancer Neg Hx    Rectal cancer Neg Hx    Stomach cancer Neg Hx    Thyroid disease Neg Hx    Colon polyps Neg Hx     Social History   Socioeconomic History   Marital status: Widowed    Spouse name: Not on file   Number of children: 2   Years of education: Not on file   Highest education level: Not on file  Occupational History    Employer: [employer]  Tobacco Use   Smoking status: Never   Smokeless tobacco: Never  Vaping Use   Vaping status: Never Used  Substance and Sexual Activity   Alcohol  use: Yes    Alcohol/week: 0.0 standard drinks of alcohol    Comment: occasional wine   Drug use: No   Sexual activity: Not Currently    Partners: Male    Birth control/protection: Post-menopausal    Comment: 1st intercourse 68 yo-Fewer than 5 partners  Other Topics Concern   Not on file  Social History Narrative   Regular exercise:  Walks 2-5 x weekly   Caffeine Use: no   Widowed   [name]- he is 58 yrs old.   Foster parent.   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   Dog (Lab named Abby)         Social Drivers of Health   Financial Resource Strain: Low Risk  (10/12/2022)   Overall Financial Resource Strain (CARDIA)    Difficulty of Paying Living Expenses: Not hard at all  Food  Insecurity: No Food Insecurity (10/12/2022)   Hunger Vital Sign    Worried About Running Out of Food in the Last Year: Never true    Ran Out of Food in the Last Year: Never true  Transportation Needs: No Transportation Needs (10/12/2022)   PRAPARE - Administrator, Civil Service (Medical): No    Lack of Transportation (Non-Medical): No  Physical Activity: Insufficiently Active (10/12/2022)   Exercise Vital Sign    Days of Exercise per Week: 2 days    Minutes of Exercise per Session: 60 min  Stress: No Stress Concern Present (10/12/2022)   [employer] of Occupational Health - Occupational Stress Questionnaire    Feeling of Stress : Not at all  Social Connections: Moderately Integrated (10/12/2022)   Social Connection and Isolation Panel [NHANES]    Frequency of Communication with Friends and Family: More than three times a week    Frequency of Social Gatherings with Friends and Family: Twice a week    Attends Religious Services: More than 4 times per year    Active Member of [employer] or Organizations: Yes    Attends Banker Meetings: More than 4 times per year    Marital Status: Widowed  Intimate Partner Violence: Not At Risk (10/12/2022)   Humiliation, Afraid, Rape, and Kick questionnaire    Fear of Current or Ex-Partner: No    Emotionally Abused: No    Physically Abused: No    Sexually Abused: No    Outpatient Medications Prior to Visit  Medication Sig Dispense Refill   acetaminophen (TYLENOL) 500 MG tablet Take 1,000 mg by mouth every 6 (six) hours as needed.     amLODipine (NORVASC) 10 MG tablet Take 1 tablet (10 mg total) by mouth daily. 90 tablet 1   aspirin EC 81 MG tablet Take 1 tablet (81 mg total) by mouth daily. Swallow whole. 30 tablet 12   atorvastatin (LIPITOR) 10 MG tablet Take 1 tablet (10 mg total) by mouth daily. 90 tablet 1   Blood Glucose Monitoring Suppl DEVI 1 each by Does not apply route in the morning, at noon, and at bedtime. May substitute  to any manufacturer covered by patient's insurance. 1 each 0   calcium-vitamin D (OSCAL WITH D) 500-200 MG-UNIT tablet Take 1 tablet by mouth.     cetirizine (ZYRTEC) 10 MG tablet Take 1 tablet (10 mg total) by mouth daily. 90 tablet 4   esomeprazole (NEXIUM) 20 MG capsule Take 1 capsule (20 mg total) by mouth daily  as needed.     JANUVIA 50 MG tablet TAKE 1 TABLET DAILY 90 tablet 3   Multiple Vitamins-Minerals (CENTRUM ULTRA WOMENS) TABS Take 1 tablet by mouth daily.     nystatin (MYCOSTATIN/NYSTOP) powder Apply 1 Application topically 3 (three) times daily. 30 g 0   valsartan (DIOVAN) 80 MG tablet Take 1 tablet (80 mg total) by mouth daily. 90 tablet 1   No facility-administered medications prior to visit.    Allergies  Allergen Reactions   Lisinopril     cough   Metformin And Related Other (See Comments)    nausea   Naproxen Nausea And Vomiting    REACTION: vomiting   Ropinirole Nausea Only   Valsartan     Cough    Vicodin [Hydrocodone-Acetaminophen] Nausea And Vomiting   Propoxyphene N-Acetaminophen Nausea And Vomiting    ROS See HPI    Objective:    Physical Exam Constitutional:      General: She is not in acute distress.    Appearance: Normal appearance. She is well-developed.  HENT:     Head: Normocephalic and atraumatic.     Right Ear: External ear normal.     Left Ear: External ear normal.  Eyes:     General: No scleral icterus. Neck:     Thyroid: No thyromegaly.  Cardiovascular:     Rate and Rhythm: Normal rate and regular rhythm.     Heart sounds: Normal heart sounds. No murmur heard. Pulmonary:     Effort: Pulmonary effort is normal. No respiratory distress.     Breath sounds: Normal breath sounds. No wheezing.  Musculoskeletal:     Cervical back: Neck supple.  Skin:    General: Skin is warm and dry.  Neurological:     Mental Status: She is alert and oriented to person, place, and time.  Psychiatric:        Mood and Affect: Mood normal.         Behavior: Behavior normal.        Thought Content: Thought content normal.        Judgment: Judgment normal.      BP (!) 118/59 (BP Location: Right Arm, Patient Position: Sitting, Cuff Size: Large)   Pulse 72   Temp 98.7 F (37.1 C) (Oral)   Resp 16   Ht 5\' 7"  (1.702 m)   Wt 230 lb (104.3 kg)   LMP 02/28/2007   SpO2 99%   BMI 36.02 kg/m  Wt Readings from Last 3 Encounters:  08/29/23 230 lb (104.3 kg)  02/23/23 228 lb (103.4 kg)  01/23/23 227 lb (103 kg)       Assessment & Plan:   Problem List Items Addressed This Visit       Unprioritized   Type 2 diabetes mellitus, controlled, with renal complications (HCC) - Primary   Lab Results  Component Value Date   HGBA1C 6.4 (H) 02/23/2023   HGBA1C 6.5 11/24/2022   HGBA1C 7.3 (H) 08/02/2022   Lab Results  Component Value Date   MICROALBUR 6.3 (H) 04/28/2022   LDLCALC 75 02/23/2023   CREATININE 0.86 02/23/2023   Continue januvia, update A1C.      Relevant Orders   HgB A1c   Basic Metabolic Panel (BMET)   Urine Microalbumin w/creat. ratio   Retinal artery occlusion, branch, left   Continue aspirin 81mg  for secondary prevention.       Hyperlipidemia   Lab Results  Component Value Date   CHOL 147 02/23/2023   HDL  57 02/23/2023   LDLCALC 75 02/23/2023   TRIG 70 02/23/2023   CHOLHDL 2.6 02/23/2023   Update lipid.       GERD (gastroesophageal reflux disease)   Stable with diet and rare use of prn nexium.       Essential hypertension   BP Readings from Last 3 Encounters:  08/29/23 (!) 118/59  02/23/23 (!) 126/50  01/23/23 (!) 128/58   BP looks great on diovan and amlodipine, continue same.       Anxiety   Reports that her anxiety is well controlled despite multiple deaths in the family.       ANEMIA, IRON DEFICIENCY   Relevant Orders   CBC w/Diff    I am having Nancy Deleon maintain her calcium-vitamin D, Centrum Ultra Womens, acetaminophen, aspirin EC, Blood Glucose Monitoring Suppl,  esomeprazole, valsartan, cetirizine, nystatin, amLODipine, atorvastatin, and Januvia.  No orders of the defined types were placed in this encounter.

## 2023-08-29 NOTE — Assessment & Plan Note (Signed)
BP Readings from Last 3 Encounters:  08/29/23 (!) 118/59  02/23/23 (!) 126/50  01/23/23 (!) 128/58   BP looks great on diovan and amlodipine, continue same.

## 2023-08-29 NOTE — Assessment & Plan Note (Signed)
Stable with diet and rare use of prn nexium.

## 2023-09-02 ENCOUNTER — Encounter: Payer: Self-pay | Admitting: Family

## 2023-09-12 ENCOUNTER — Telehealth: Payer: Self-pay | Admitting: Family

## 2023-09-12 NOTE — Telephone Encounter (Signed)
 Copied from CRM [id number]. Topic: Medicare AWV >> Sep 12, 2023  1:09 PM Nathanel DEL wrote: Reason for CRM: Called LVM 09/12/2023 to schedule AWV. Please schedule Virtual or Telehealth visits ONLY.   Nathanel Paschal; Care Guide Ambulatory Clinical Support [place] [facility] Direct Dial: [phone]

## 2023-10-02 ENCOUNTER — Other Ambulatory Visit: Payer: Self-pay | Admitting: Family

## 2023-10-02 DIAGNOSIS — Z1231 Encounter for screening mammogram for malignant neoplasm of breast: Secondary | ICD-10-CM

## 2023-10-09 ENCOUNTER — Ambulatory Visit
Admission: RE | Admit: 2023-10-09 | Discharge: 2023-10-09 | Disposition: A | Discharge status: Home / Self Care | Payer: Medicare PPO | Source: Ambulatory Visit | Attending: Family | Admitting: Family

## 2023-10-09 DIAGNOSIS — Z1231 Encounter for screening mammogram for malignant neoplasm of breast: Secondary | ICD-10-CM | POA: Diagnosis not present

## 2023-10-11 ENCOUNTER — Other Ambulatory Visit: Payer: Self-pay | Admitting: Family

## 2023-11-27 ENCOUNTER — Ambulatory Visit (INDEPENDENT_AMBULATORY_CARE_PROVIDER_SITE_OTHER): Payer: Medicare PPO | Admitting: Family

## 2023-11-27 VITALS — BP 129/54 | HR 71 | Temp 98.7°F | Resp 16 | Ht 67.0 in | Wt 233.0 lb

## 2023-11-27 DIAGNOSIS — I10 Essential (primary) hypertension: Secondary | ICD-10-CM

## 2023-11-27 DIAGNOSIS — E1129 Type 2 diabetes mellitus with other diabetic kidney complication: Secondary | ICD-10-CM | POA: Diagnosis not present

## 2023-11-27 DIAGNOSIS — J301 Allergic rhinitis due to pollen: Secondary | ICD-10-CM

## 2023-11-27 DIAGNOSIS — K219 Gastro-esophageal reflux disease without esophagitis: Secondary | ICD-10-CM | POA: Diagnosis not present

## 2023-11-27 DIAGNOSIS — D509 Iron deficiency anemia, unspecified: Secondary | ICD-10-CM | POA: Diagnosis not present

## 2023-11-27 DIAGNOSIS — E785 Hyperlipidemia, unspecified: Secondary | ICD-10-CM | POA: Diagnosis not present

## 2023-11-27 DIAGNOSIS — R809 Proteinuria, unspecified: Secondary | ICD-10-CM

## 2023-11-27 DIAGNOSIS — F419 Anxiety disorder, unspecified: Secondary | ICD-10-CM

## 2023-11-27 DIAGNOSIS — Z7984 Long term (current) use of oral hypoglycemic drugs: Secondary | ICD-10-CM | POA: Diagnosis not present

## 2023-11-27 LAB — CBC WITH DIFFERENTIAL/PLATELET
Basophils Absolute: 0.1 10*3/uL (ref 0.0–0.1)
Basophils Relative: 1 % (ref 0.0–3.0)
Eosinophils Absolute: 0.2 10*3/uL (ref 0.0–0.7)
Eosinophils Relative: 2.9 % (ref 0.0–5.0)
HCT: 37.9 % (ref 36.0–46.0)
Hemoglobin: 13 g/dL (ref 12.0–15.0)
Lymphocytes Relative: 26.8 % (ref 12.0–46.0)
Lymphs Abs: 1.9 10*3/uL (ref 0.7–4.0)
MCHC: 34.4 g/dL (ref 30.0–36.0)
MCV: 74.8 fl — ABNORMAL LOW (ref 78.0–100.0)
Monocytes Absolute: 0.4 10*3/uL (ref 0.1–1.0)
Monocytes Relative: 5.7 % (ref 3.0–12.0)
Neutro Abs: 4.5 10*3/uL (ref 1.4–7.7)
Neutrophils Relative %: 63.6 % (ref 43.0–77.0)
Platelets: 170 10*3/uL (ref 150.0–400.0)
RBC: 5.07 Mil/uL (ref 3.87–5.11)
RDW: 13.5 % (ref 11.5–15.5)
WBC: 7.1 10*3/uL (ref 4.0–10.5)

## 2023-11-27 LAB — IBC PANEL
Iron: 92 ug/dL (ref 42–145)
Saturation Ratios: 26.3 % (ref 20.0–50.0)
TIBC: 350 ug/dL (ref 250.0–450.0)
Transferrin: 250 mg/dL (ref 212.0–360.0)

## 2023-11-27 LAB — HEMOGLOBIN A1C: Hgb A1c MFr Bld: 7 % — ABNORMAL HIGH (ref 4.6–6.5)

## 2023-11-27 NOTE — Progress Notes (Signed)
 Subjective:     Patient ID: Nancy Deleon, female    DOB: 05/24/1956, 68 y.o.   MRN: [id number]  Chief Complaint  Patient presents with   Diabetes    Here for follow up    Hypertension    Here for follow up    HPI  Discussed the use of AI scribe software for clinical note transcription with the patient, who gave verbal consent to proceed.  History of Present Illness  Patient is a 68 yr old female who presents today for follow up.  She continues januvia  for diabetes, lipitor for cholesterol and diovan /amlodipine  for blood pressure. She has no complaints today.       Health Maintenance Due  Topic Date Due   COVID-19 Vaccine (5 - 2024-25 season) 04/08/2023   Medicare Annual Wellness (AWV)  10/12/2023   FOOT EXAM  11/24/2023    Past Medical History:  Diagnosis Date   Allergy    Fatty liver 04/03/2014   GERD (gastroesophageal reflux disease)    Hypertension    Intraductal papilloma of breast 08/15/2017   Multiple thyroid  nodules 08/20/2017   Obesity    Type 2 diabetes mellitus, controlled, with renal complications (HCC) 03/13/2018   12/18/19-pt states she does not have diabetes, just checks blood sugar as needed.    Past Surgical History:  Procedure Laterality Date   BIOPSY THYROID      BREAST BIOPSY  04/24/11   [facility]   BREAST EXCISIONAL BIOPSY     BREAST LUMPECTOMY WITH RADIOACTIVE SEED LOCALIZATION Left 05/01/2017   Procedure: LEFT BREAST LUMPECTOMY WITH RADIOACTIVE SEED LOCALIZATION;  Surgeon: Dareen Ebbing, MD;  Location: [facility];  Service: General;  Laterality: Left;   CESAREAN SECTION  [id number]   COLONOSCOPY  2011   [facility]-  normal   toenail removal  09/2020   Dr. Lois Rink   TUBAL LIGATION     UPPER GASTROINTESTINAL ENDOSCOPY      Family History  Problem Relation Age of Onset   Lymphoma Mother    Dementia Mother    Cancer Father        lung   Colon cancer Maternal Uncle    Esophageal cancer Neg Hx    Pancreatic  cancer Neg Hx    Rectal cancer Neg Hx    Stomach cancer Neg Hx    Thyroid  disease Neg Hx    Colon polyps Neg Hx     Social History   Socioeconomic History   Marital status: Widowed    Spouse name: Not on file   Number of children: 2   Years of education: Not on file   Highest education level: Not on file  Occupational History    Employer: [employer]  Tobacco Use   Smoking status: Never   Smokeless tobacco: Never  Vaping Use   Vaping status: Never Used  Substance and Sexual Activity   Alcohol use: Yes    Alcohol/week: 0.0 standard drinks of alcohol    Comment: occasional wine   Drug use: No   Sexual activity: Not Currently    Partners: Male    Birth control/protection: Post-menopausal    Comment: 1st intercourse 68 yo-Fewer than 5 partners  Other Topics Concern   Not on file  Social History Narrative   Regular exercise:  Walks 2-5 x weekly   Caffeine Use: no   Widowed   She has a son Royston Cornea- he is 51 yrs old.   Foster parent.   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   Dog (Lab named Abby)         Social Drivers of Health   Financial Resource Strain: Low Risk  (10/12/2022)   Overall Financial Resource Strain (CARDIA)    Difficulty of Paying Living Expenses: Not hard at all  Food Insecurity: No Food Insecurity (10/12/2022)   Hunger Vital Sign    Worried About Running Out of Food in the Last Year: Never true    Ran Out of Food in the Last Year: Never true  Transportation Needs: No Transportation Needs (10/12/2022)   PRAPARE - Administrator, Civil Service (Medical): No    Lack of Transportation (Non-Medical): No  Physical Activity: Insufficiently Active (10/12/2022)   Exercise Vital Sign    Days of Exercise per Week: 2 days    Minutes of Exercise per Session: 60 min  Stress: No Stress Concern Present (10/12/2022)   [employer] of Occupational Health - Occupational Stress Questionnaire     Feeling of Stress : Not at all  Social Connections: Moderately Integrated (10/12/2022)   Social Connection and Isolation Panel [NHANES]    Frequency of Communication with Friends and Family: More than three times a week    Frequency of Social Gatherings with Friends and Family: Twice a week    Attends Religious Services: More than 4 times per year    Active Member of [employer] or Organizations: Yes    Attends Banker Meetings: More than 4 times per year    Marital Status: Widowed  Intimate Partner Violence: Not At Risk (10/12/2022)   Humiliation, Afraid, Rape, and Kick questionnaire    Fear of Current or Ex-Partner: No    Emotionally Abused: No    Physically Abused: No    Sexually Abused: No    Outpatient Medications Prior to Visit  Medication Sig Dispense Refill   acetaminophen  (TYLENOL ) 500 MG tablet Take 1,000 mg by mouth every 6 (six) hours as needed.     amLODipine  (NORVASC ) 10 MG tablet TAKE 1 TABLET DAILY 90 tablet 1   aspirin  EC 81 MG tablet Take 1 tablet (81 mg total) by mouth daily. Swallow whole. 30 tablet 12   atorvastatin  (LIPITOR) 10 MG tablet Take 1 tablet (10 mg total) by mouth daily. 90 tablet 1   Blood Glucose Monitoring Suppl DEVI 1 each by Does not apply route in the morning, at noon, and at bedtime. May substitute to any manufacturer covered by patient's insurance. 1 each 0   calcium -vitamin D (OSCAL WITH D) 500-200 MG-UNIT tablet Take 1 tablet by mouth.     cetirizine  (ZYRTEC ) 10 MG tablet Take 1 tablet (10 mg total) by mouth daily. 90 tablet 4   esomeprazole  (NEXIUM ) 20 MG capsule Take 1 capsule (20 mg total) by mouth daily as needed.     JANUVIA  50 MG tablet TAKE 1 TABLET DAILY 90 tablet 3   Multiple Vitamins-Minerals (CENTRUM ULTRA WOMENS) TABS Take 1 tablet by mouth daily.     nystatin  (MYCOSTATIN /NYSTOP ) powder Apply 1 Application topically 3 (three) times daily. 30 g 0   valsartan  (DIOVAN ) 80 MG tablet Take 1 tablet (80 mg total) by mouth daily. 90  tablet 1   No facility-administered medications prior to visit.    Allergies  Allergen Reactions   Lisinopril      cough   Metformin  And Related Other (See Comments)    nausea  Naproxen Nausea And Vomiting    REACTION: vomiting   Ropinirole  Nausea Only   Valsartan      Cough    Vicodin [Hydrocodone-Acetaminophen ] Nausea And Vomiting   Propoxyphene N-Acetaminophen  Nausea And Vomiting    ROS See HPI    Objective:    Physical Exam Constitutional:      General: She is not in acute distress.    Appearance: Normal appearance. She is well-developed.  HENT:     Head: Normocephalic and atraumatic.     Right Ear: External ear normal.     Left Ear: External ear normal.  Eyes:     General: No scleral icterus. Neck:     Thyroid : No thyromegaly.  Cardiovascular:     Rate and Rhythm: Normal rate and regular rhythm.     Heart sounds: Normal heart sounds. No murmur heard. Pulmonary:     Effort: Pulmonary effort is normal. No respiratory distress.     Breath sounds: Normal breath sounds. No wheezing.  Musculoskeletal:     Cervical back: Neck supple.  Skin:    General: Skin is warm and dry.  Neurological:     Mental Status: She is alert and oriented to person, place, and time.  Psychiatric:        Mood and Affect: Mood normal.        Behavior: Behavior normal.        Thought Content: Thought content normal.        Judgment: Judgment normal.      BP (!) 129/54 (BP Location: Right Arm, Patient Position: Sitting, Cuff Size: Large)   Pulse 71   Temp 98.7 F (37.1 C) (Oral)   Resp 16   Ht 5\' 7"  (1.702 m)   Wt 233 lb (105.7 kg)   LMP 02/28/2007   SpO2 98%   BMI 36.49 kg/m  Wt Readings from Last 3 Encounters:  11/27/23 233 lb (105.7 kg)  08/29/23 230 lb (104.3 kg)  02/23/23 228 lb (103.4 kg)       Assessment & Plan:   Problem List Items Addressed This Visit       Unprioritized   Type 2 diabetes mellitus, controlled, with renal complications (HCC)   Lab Results   Component Value Date   HGBA1C 6.9 (H) 08/29/2023   HGBA1C 6.4 (H) 02/23/2023   HGBA1C 6.5 11/24/2022   Lab Results  Component Value Date   MICROALBUR 5.9 (H) 08/29/2023   LDLCALC 75 02/23/2023   CREATININE 0.94 08/29/2023   Maintained on januvia .  Last A1C had risen but was still at goal. Update A1C.        Relevant Orders   HgB A1c   Microalbuminuria   Continues arb.       Hyperlipidemia   Lab Results  Component Value Date   CHOL 147 02/23/2023   HDL 57 02/23/2023   LDLCALC 75 02/23/2023   TRIG 70 02/23/2023   CHOLHDL 2.6 02/23/2023  Stable on lipitor, continue same.        GERD (gastroesophageal reflux disease)   Stable on Nexium , continue same.       Essential hypertension   BP Readings from Last 3 Encounters:  11/27/23 (!) 129/54  08/29/23 (!) 118/59  02/23/23 (!) 126/50   At goal on amlodipine  and valsartan . Continue same.       Anxiety   Reports stable mood.       ANEMIA, IRON DEFICIENCY   Lab Results  Component Value Date   WBC 7.8 08/29/2023  HGB 12.8 08/29/2023   HCT 37.5 08/29/2023   MCV 74.9 (L) 08/29/2023   PLT 187.0 08/29/2023  States she stopped MVI with minerals- plans to restart.        Relevant Orders   CBC w/Diff   IBC panel   Allergic rhinitis - Primary   Stable with zyrtec , continue same.       I am having Jahniyah Revere. Maring maintain her calcium -vitamin D, Centrum Ultra Womens, acetaminophen , aspirin  EC, Blood Glucose Monitoring Suppl, esomeprazole , valsartan , cetirizine , nystatin , atorvastatin , Januvia , and amLODipine .  No orders of the defined types were placed in this encounter.

## 2023-11-27 NOTE — Assessment & Plan Note (Signed)
Stable with zyrtec, continue same.  

## 2023-11-27 NOTE — Assessment & Plan Note (Signed)
 Continues arb.

## 2023-11-27 NOTE — Assessment & Plan Note (Signed)
Stable on Nexium, continue same. 

## 2023-11-27 NOTE — Assessment & Plan Note (Addendum)
 Lab Results  Component Value Date   HGBA1C 6.9 (H) 08/29/2023   HGBA1C 6.4 (H) 02/23/2023   HGBA1C 6.5 11/24/2022   Lab Results  Component Value Date   MICROALBUR 5.9 (H) 08/29/2023   LDLCALC 75 02/23/2023   CREATININE 0.94 08/29/2023   Maintained on januvia .  Last A1C had risen but was still at goal. Update A1C.

## 2023-11-27 NOTE — Assessment & Plan Note (Signed)
 Lab Results  Component Value Date   WBC 7.8 08/29/2023   HGB 12.8 08/29/2023   HCT 37.5 08/29/2023   MCV 74.9 (L) 08/29/2023   PLT 187.0 08/29/2023  States she stopped MVI with minerals- plans to restart.

## 2023-11-27 NOTE — Assessment & Plan Note (Signed)
 Reports stable mood

## 2023-11-27 NOTE — Assessment & Plan Note (Signed)
>>  ASSESSMENT AND PLAN FOR ALLERGIC RHINITIS WRITTEN ON 11/27/2023 11:38 AM BY O'SULLIVAN, Abbeygail Igoe, NP  Stable with zyrtec , continue same.

## 2023-11-27 NOTE — Assessment & Plan Note (Signed)
 BP Readings from Last 3 Encounters:  11/27/23 (!) 129/54  08/29/23 (!) 118/59  02/23/23 (!) 126/50   At goal on amlodipine  and valsartan . Continue same.

## 2023-11-27 NOTE — Assessment & Plan Note (Signed)
 Lab Results  Component Value Date   CHOL 147 02/23/2023   HDL 57 02/23/2023   LDLCALC 75 02/23/2023   TRIG 70 02/23/2023   CHOLHDL 2.6 02/23/2023  Stable on lipitor, continue same.

## 2023-11-29 ENCOUNTER — Encounter: Payer: Self-pay | Admitting: Family

## 2023-12-18 ENCOUNTER — Telehealth: Payer: Self-pay | Admitting: Family

## 2023-12-18 NOTE — Telephone Encounter (Signed)
 Copied from CRM [id number]. Topic: Medicare AWV >> Dec 18, 2023  3:08 PM Juliana Ocean wrote: Reason for CRM: LVM 12/18/2023 to schedule AWV. Please schedule Virtual or Telehealth visits ONLY  Rosalee Collins; Care Guide Ambulatory Clinical Support [place] [facility] Direct Dial: [phone]

## 2023-12-27 ENCOUNTER — Encounter: Payer: Self-pay | Admitting: Nurse Practitioner

## 2023-12-27 ENCOUNTER — Ambulatory Visit (INDEPENDENT_AMBULATORY_CARE_PROVIDER_SITE_OTHER): Admitting: Nurse Practitioner

## 2023-12-27 VITALS — BP 114/62 | HR 80 | Ht 66.0 in | Wt 230.4 lb

## 2023-12-27 DIAGNOSIS — Z01419 Encounter for gynecological examination (general) (routine) without abnormal findings: Secondary | ICD-10-CM

## 2023-12-27 DIAGNOSIS — Z780 Asymptomatic menopausal state: Secondary | ICD-10-CM

## 2023-12-27 NOTE — Progress Notes (Signed)
   Nancy Deleon [birth year] [id number]   History:  68 y.o. G1P1 presents for breast and pelvic exam. Postmenopausal - no HRT, no bleeding. Normal pap history. 2018 left breast lumpectomy with radioactive seed placement. DM managed by PCP.   Gynecologic History Patient's last menstrual period was 02/28/2007.   Contraception/Family planning: post menopausal status Sexually active: No  Health Maintenance Last Pap: 12/19/2021. Results were: Normal Last mammogram: 10/09/2023. Results were: normal Last colonoscopy: 12/2019. Results were: Normal, 10-year recall Last Dexa: 09/20/2018. Results were: Normal, 5-year recall  Past medical history, past surgical history, family history and social history were all reviewed and documented in the EPIC chart. Widowed. Son lives in [place] , married, has 9 mo daughter. Retired.    ROS:  A ROS was performed and pertinent positives and negatives are included.  Exam:  Vitals:   12/27/23 1047  BP: 114/62  Pulse: 80  SpO2: 97%  Weight: 230 lb 6.4 oz (104.5 kg)  Height: 5\' 6"  (1.676 m)     Body mass index is 37.19 kg/m.  General appearance:  Normal Thyroid :  Symmetrical, normal in size, without palpable masses or nodularity. Respiratory  Auscultation:  Clear without wheezing or rhonchi Cardiovascular  Auscultation:  Regular rate, without rubs, murmurs or gallops  Edema/varicosities:  Not grossly evident Abdominal  Soft,nontender, without masses, guarding or rebound.  Liver/spleen:  No organomegaly noted  Hernia:  None appreciated  Skin  Inspection:  Grossly normal Breasts: Examined lying and sitting.   Right: Without masses, retractions, nipple discharge or axillary adenopathy.   Left: Without masses, retractions, nipple discharge or axillary adenopathy. Pelvic: External genitalia:  no lesions              Urethra:  normal appearing urethra with no masses, tenderness or lesions              Bartholins and Skenes: normal                  Vagina: normal appearing vagina with normal color and discharge, no lesions              Cervix: no lesions Bimanual Exam:  Uterus:  no masses or tenderness              Adnexa: no mass, fullness, tenderness              Rectovaginal: Deferred              Anus:  normal, no lesions  Assessment/Plan:  69 y.o. G1P1 for breast and pelvic exam.   Well female exam with routine gynecological exam - Education provided on SBEs, importance of preventative screenings, current guidelines, high calcium  diet, regular exercise, and multivitamin daily. Labs with PCP.   Postmenopausal - no HRT, no bleeding.  Screening for cervical cancer - Normal Pap history. No longer screening per guidelines.   Screening for breast cancer - Normal mammogram history.  Continue annual screenings.  Normal breast exam today.  Screening for colon cancer - 12/2019 normal colonoscopy. Will repeat at GI's recommended interval.   Screening for osteoporosis - normal DXA 2020. Recommend repeating. Plans to discuss with PCP.   Return in about 2 years (around 12/26/2025) for B&P.   Andee Bamberger DNP, 11:11 AM 12/27/2023

## 2024-01-05 ENCOUNTER — Other Ambulatory Visit: Payer: Self-pay | Admitting: Family

## 2024-02-07 ENCOUNTER — Telehealth: Payer: Self-pay | Admitting: Family

## 2024-02-07 NOTE — Telephone Encounter (Signed)
 Copied from CRM [id number]. Topic: Medicare AWV >> Feb 07, 2024 11:25 AM Nathanel DEL wrote: Reason for CRM: LVM 02/07/2024 to schedule AWV. Please schedule Virtual or Telehealth visits ONLY.   Nathanel Paschal; Care Guide Ambulatory Clinical Support [facility] Direct Dial: [phone]

## 2024-04-01 ENCOUNTER — Ambulatory Visit (INDEPENDENT_AMBULATORY_CARE_PROVIDER_SITE_OTHER): Admitting: Family

## 2024-04-01 ENCOUNTER — Ambulatory Visit: Payer: Self-pay | Admitting: Family

## 2024-04-01 VITALS — BP 126/55 | HR 64 | Temp 98.6°F | Resp 16 | Ht 66.0 in | Wt 228.0 lb

## 2024-04-01 DIAGNOSIS — E785 Hyperlipidemia, unspecified: Secondary | ICD-10-CM | POA: Diagnosis not present

## 2024-04-01 DIAGNOSIS — L304 Erythema intertrigo: Secondary | ICD-10-CM

## 2024-04-01 DIAGNOSIS — H34232 Retinal artery branch occlusion, left eye: Secondary | ICD-10-CM

## 2024-04-01 DIAGNOSIS — I10 Essential (primary) hypertension: Secondary | ICD-10-CM | POA: Diagnosis not present

## 2024-04-01 DIAGNOSIS — K219 Gastro-esophageal reflux disease without esophagitis: Secondary | ICD-10-CM | POA: Diagnosis not present

## 2024-04-01 DIAGNOSIS — E042 Nontoxic multinodular goiter: Secondary | ICD-10-CM | POA: Diagnosis not present

## 2024-04-01 DIAGNOSIS — J302 Other seasonal allergic rhinitis: Secondary | ICD-10-CM | POA: Diagnosis not present

## 2024-04-01 DIAGNOSIS — R002 Palpitations: Secondary | ICD-10-CM

## 2024-04-01 DIAGNOSIS — Z7984 Long term (current) use of oral hypoglycemic drugs: Secondary | ICD-10-CM

## 2024-04-01 DIAGNOSIS — E1129 Type 2 diabetes mellitus with other diabetic kidney complication: Secondary | ICD-10-CM

## 2024-04-01 DIAGNOSIS — N281 Cyst of kidney, acquired: Secondary | ICD-10-CM

## 2024-04-01 DIAGNOSIS — R809 Proteinuria, unspecified: Secondary | ICD-10-CM

## 2024-04-01 LAB — LIPID PANEL
Cholesterol: 138 mg/dL (ref 0–200)
HDL: 49.7 mg/dL (ref >39.00)
LDL Cholesterol: 68 mg/dL (ref 0–99)
NonHDL: 88.44
Total CHOL/HDL Ratio: 3
Triglycerides: 104 mg/dL (ref 0.0–149.0)
VLDL: 20.8 mg/dL (ref 0.0–40.0)

## 2024-04-01 LAB — CBC WITH DIFFERENTIAL/PLATELET
Basophils Absolute: 0.1 K/uL (ref 0.0–0.1)
Basophils Relative: 1.2 % (ref 0.0–3.0)
Eosinophils Absolute: 0.1 K/uL (ref 0.0–0.7)
Eosinophils Relative: 2 % (ref 0.0–5.0)
HCT: 37.3 % (ref 36.0–46.0)
Hemoglobin: 12.8 g/dL (ref 12.0–15.0)
Lymphocytes Relative: 28.8 % (ref 12.0–46.0)
Lymphs Abs: 1.8 K/uL (ref 0.7–4.0)
MCHC: 34.2 g/dL (ref 30.0–36.0)
MCV: 73.8 fl — ABNORMAL LOW (ref 78.0–100.0)
Monocytes Absolute: 0.4 K/uL (ref 0.1–1.0)
Monocytes Relative: 5.9 % (ref 3.0–12.0)
Neutro Abs: 3.9 K/uL (ref 1.4–7.7)
Neutrophils Relative %: 62.1 % (ref 43.0–77.0)
Platelets: 162 K/uL (ref 150.0–400.0)
RBC: 5.06 Mil/uL (ref 3.87–5.11)
RDW: 14 % (ref 11.5–15.5)
WBC: 6.2 K/uL (ref 4.0–10.5)

## 2024-04-01 LAB — COMPREHENSIVE METABOLIC PANEL WITH GFR
ALT: 12 U/L (ref 0–35)
AST: 14 U/L (ref 0–37)
Albumin: 4.3 g/dL (ref 3.5–5.2)
Alkaline Phosphatase: 68 U/L (ref 39–117)
BUN: 12 mg/dL (ref 6–23)
CO2: 31 meq/L (ref 19–32)
Calcium: 9.9 mg/dL (ref 8.4–10.5)
Chloride: 103 meq/L (ref 96–112)
Creatinine, Ser: 0.92 mg/dL (ref 0.40–1.20)
GFR: 64.47 mL/min (ref >60.00)
Glucose, Bld: 143 mg/dL — ABNORMAL HIGH (ref 70–99)
Potassium: 4.5 meq/L (ref 3.5–5.1)
Sodium: 142 meq/L (ref 135–145)
Total Bilirubin: 0.7 mg/dL (ref 0.2–1.2)
Total Protein: 7.2 g/dL (ref 6.0–8.3)

## 2024-04-01 LAB — HEMOGLOBIN A1C: Hgb A1c MFr Bld: 7.1 % — ABNORMAL HIGH (ref 4.6–6.5)

## 2024-04-01 LAB — MICROALBUMIN / CREATININE URINE RATIO
Creatinine,U: 170.8 mg/dL
Microalb Creat Ratio: 14.5 mg/g (ref 0.0–30.0)
Microalb, Ur: 2.5 mg/dL — ABNORMAL HIGH (ref 0.0–1.9)

## 2024-04-01 LAB — TSH: TSH: 0.5 u[IU]/mL (ref 0.35–5.50)

## 2024-04-01 MED ORDER — CETIRIZINE HCL 10 MG PO TABS: 10.0000 mg | ORAL_TABLET | Freq: Every day | ORAL | 4 refills | Status: AC

## 2024-04-01 MED ORDER — SITAGLIPTIN PHOSPHATE 50 MG PO TABS: 50.0000 mg | ORAL_TABLET | Freq: Every day | ORAL | 3 refills | Status: AC

## 2024-04-01 MED ORDER — NYSTATIN-TRIAMCINOLONE 100000-0.1 UNIT/GM-% EX OINT
1.0000 | TOPICAL_OINTMENT | Freq: Two times a day (BID) | CUTANEOUS | 2 refills | Status: AC
Start: 2024-04-01 — End: ?

## 2024-04-01 MED ORDER — VITAMIN D3 75 MCG (3000 UT) PO TABS: 1.0000 | ORAL_TABLET | Freq: Every day | ORAL | Status: AC

## 2024-04-01 MED ORDER — VALSARTAN 80 MG PO TABS: 80.0000 mg | ORAL_TABLET | Freq: Every day | ORAL | 1 refills | Status: DC

## 2024-04-01 MED ORDER — AMLODIPINE BESYLATE 10 MG PO TABS: 10.0000 mg | ORAL_TABLET | Freq: Every day | ORAL | 1 refills | Status: DC

## 2024-04-01 MED ORDER — ESOMEPRAZOLE MAGNESIUM 20 MG PO CPDR: 20.0000 mg | DELAYED_RELEASE_CAPSULE | Freq: Every day | ORAL | 1 refills | Status: AC | PRN

## 2024-04-01 NOTE — Patient Instructions (Signed)
 VISIT SUMMARY:  Today, you were seen for palpitations, diabetes management, hypertension, GERD, a yeast rash under your breast, and seasonal allergies.  YOUR PLAN:  PALPITATIONS: You experienced brief palpitations during exercise without pain. -We will perform an EKG to check your heart rhythm. -We will also do blood tests to check your electrolytes, thyroid  function, and complete blood count. -If the palpitations continue, we may consider using a Cardiac monitor to track your heart activity over a longer period.  TYPE 2 DIABETES MELLITUS: Your A1c level has slightly increased to 7.0, and you have had some difficulty sticking to your diet. -Continue taking Januvia  50 mg daily. -Work on improving your diet to better manage your blood sugar levels. -We have provided prescription refills for your medication.  HYPERTENSION: Your blood pressure is well-controlled with your current medication. -Continue taking your current antihypertensive medication, amlodipine . -We have provided prescription refills for your medication.  GASTROESOPHAGEAL REFLUX DISEASE (GERD): You have occasional acid symptoms triggered by certain foods. -Continue taking Nexium  as needed. -We have provided a prescription for Nexium .  INTERTRIGO (YEAST RASH UNDER BREAST): You have a yeast rash under your breast that has not been effectively managed with powder. -We have prescribed an antifungal cream to help treat the rash.  ALLERGIC RHINITIS, SEASONAL: You have seasonal allergies, primarily in the fall. -We have provided a prescription for Zyrtec  to manage your allergy symptoms.

## 2024-04-01 NOTE — Assessment & Plan Note (Signed)
 US  most consistent with simple appearing cyst.

## 2024-04-01 NOTE — Assessment & Plan Note (Signed)
 Stable currently without zyrtec . Generally start to act up in the fall

## 2024-04-01 NOTE — Assessment & Plan Note (Signed)
 New.  EKG is performed today and personally reviewed. Notes few PAC's.  Monitor. If increase in frequency/severity of symptoms I have asked her to let me know and we could plan Zio monitor at that time.

## 2024-04-01 NOTE — Assessment & Plan Note (Signed)
 Prefers ointment to powder. New rx sent for nystatin  ointment.

## 2024-04-01 NOTE — Progress Notes (Signed)
 Subjective:     Patient ID: Nancy Deleon, female    DOB: 12/13/1955, 68 y.o.   MRN: [id number]  Chief Complaint  Patient presents with   Diabetes    Here for follow up   Hypertension    Here for follow up    Diabetes  Hypertension    Discussed the use of AI scribe software for clinical note transcription with the patient, who gave verbal consent to proceed.  History of Present Illness Nancy Deleon is a 68 year old female with diabetes who presents with palpitations.  She experiences palpitations on two occasions, initially during exercise. The episodes last less than a second and are not painful.  She manages diabetes with Januvia  50 mg. Her A1c is 7.0, slightly higher than previous values. She is concerned about her blood sugar levels due to recent dietary non-adherence.  She takes amlodipine  for hypertension. Nexium  is used as needed for acid symptoms, and Zyrtec  is typically used for allergies but not yet this year.  She engages in regular physical activity, which improves her sleep.  She has a rash under her breast, suspected to be yeast-related, and uses powder with limited effectiveness.      Health Maintenance Due  Topic Date Due   Diabetic kidney evaluation - Urine ACR  Never done   COVID-19 Vaccine (5 - 2024-25 season) 04/08/2023   Medicare Annual Wellness (AWV)  10/12/2023   INFLUENZA VACCINE  03/07/2024    Past Medical History:  Diagnosis Date   Allergy    Fatty liver 04/03/2014   GERD (gastroesophageal reflux disease)    Hypertension    Intraductal papilloma of breast 08/15/2017   Multiple thyroid  nodules 08/20/2017   Obesity    Type 2 diabetes mellitus, controlled, with renal complications (HCC) 03/13/2018   12/18/19-pt states she does not have diabetes, just checks blood sugar as needed.    Past Surgical History:  Procedure Laterality Date   BIOPSY THYROID      BREAST BIOPSY  04/24/11   [facility] breast   BREAST EXCISIONAL  BIOPSY     BREAST LUMPECTOMY WITH RADIOACTIVE SEED LOCALIZATION Left 05/01/2017   Procedure: LEFT BREAST LUMPECTOMY WITH RADIOACTIVE SEED LOCALIZATION;  Surgeon: Belinda Cough, MD;  Location: [facility];  Service: General;  Laterality: Left;   CESAREAN SECTION  [id number]   COLONOSCOPY  2011   [facility]-  normal   toenail removal  09/2020   Dr. Burt   TUBAL LIGATION     UPPER GASTROINTESTINAL ENDOSCOPY      Family History  Problem Relation Age of Onset   Lymphoma Mother    Dementia Mother    Cancer Father        lung   Colon cancer Maternal Uncle    Esophageal cancer Neg Hx    Pancreatic cancer Neg Hx    Rectal cancer Neg Hx    Stomach cancer Neg Hx    Thyroid  disease Neg Hx    Colon polyps Neg Hx     Social History   Socioeconomic History   Marital status: Widowed    Spouse name: Not on file   Number of children: 2   Years of education: Not on file   Highest education level: Not on file  Occupational History    Employer: [employer]  Tobacco Use   Smoking status: Never   Smokeless tobacco: Never  Vaping Use   Vaping status: Never Used  Substance and Sexual Activity  Alcohol use: Yes    Alcohol/week: 0.0 standard drinks of alcohol    Comment: occasional wine   Drug use: No   Sexual activity: Not Currently    Partners: Male    Birth control/protection: Post-menopausal    Comment: 1st intercourse 68 yo-Fewer than 5 partners  Other Topics Concern   Not on file  Social History Narrative   Regular exercise:  Walks 2-5 x weekly   Caffeine Use: no   Widowed   She has a son Lynwood- he is 65 yrs old.   Foster parent.   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   Dog (Lab named Abby)         Social Drivers of Health   Financial Resource Strain: Low Risk  (10/12/2022)   Overall Financial Resource Strain (CARDIA)    Difficulty of Paying Living Expenses: Not hard at all  Food  Insecurity: No Food Insecurity (10/12/2022)   Hunger Vital Sign    Worried About Running Out of Food in the Last Year: Never true    Ran Out of Food in the Last Year: Never true  Transportation Needs: No Transportation Needs (10/12/2022)   PRAPARE - Administrator, Civil Service (Medical): No    Lack of Transportation (Non-Medical): No  Physical Activity: Insufficiently Active (10/12/2022)   Exercise Vital Sign    Days of Exercise per Week: 2 days    Minutes of Exercise per Session: 60 min  Stress: No Stress Concern Present (10/12/2022)   [employer] of Occupational Health - Occupational Stress Questionnaire    Feeling of Stress : Not at all  Social Connections: Moderately Integrated (10/12/2022)   Social Connection and Isolation Panel    Frequency of Communication with Friends and Family: More than three times a week    Frequency of Social Gatherings with Friends and Family: Twice a week    Attends Religious Services: More than 4 times per year    Active Member of [employer] or Organizations: Yes    Attends Banker Meetings: More than 4 times per year    Marital Status: Widowed  Intimate Partner Violence: Not At Risk (10/12/2022)   Humiliation, Afraid, Rape, and Kick questionnaire    Fear of Current or Ex-Partner: No    Emotionally Abused: No    Physically Abused: No    Sexually Abused: No    Outpatient Medications Prior to Visit  Medication Sig Dispense Refill   acetaminophen  (TYLENOL ) 500 MG tablet Take 1,000 mg by mouth every 6 (six) hours as needed.     aspirin  EC 81 MG tablet Take 1 tablet (81 mg total) by mouth daily. Swallow whole. 30 tablet 12   atorvastatin  (LIPITOR) 10 MG tablet TAKE 1 TABLET DAILY 90 tablet 1   Blood Glucose Monitoring Suppl DEVI 1 each by Does not apply route in the morning, at noon, and at bedtime. May substitute to any manufacturer covered by patient's insurance. 1 each 0   calcium -vitamin D (OSCAL WITH D) 500-200 MG-UNIT tablet Take 1  tablet by mouth.     Multiple Vitamins-Minerals (CENTRUM ULTRA WOMENS) TABS Take 1 tablet by mouth daily.     nystatin  (MYCOSTATIN /NYSTOP ) powder Apply 1 Application topically 3 (three) times daily. 30 g 0   amLODipine  (NORVASC ) 10 MG tablet TAKE 1 TABLET DAILY 90 tablet 1   cetirizine  (ZYRTEC ) 10 MG tablet Take 1 tablet (10 mg total)  by mouth daily. 90 tablet 4   esomeprazole  (NEXIUM ) 20 MG capsule Take 1 capsule (20 mg total) by mouth daily as needed.     JANUVIA  50 MG tablet TAKE 1 TABLET DAILY 90 tablet 3   valsartan  (DIOVAN ) 80 MG tablet Take 1 tablet (80 mg total) by mouth daily. 90 tablet 1   No facility-administered medications prior to visit.    Allergies  Allergen Reactions   Lisinopril      cough   Metformin  And Related Other (See Comments)    nausea   Naproxen Nausea And Vomiting    REACTION: vomiting   Ropinirole  Nausea Only   Valsartan      Cough    Vicodin [Hydrocodone-Acetaminophen ] Nausea And Vomiting   Propoxyphene N-Acetaminophen  Nausea And Vomiting    ROS     Objective:    Physical Exam Constitutional:      General: She is not in acute distress.    Appearance: Normal appearance. She is well-developed.  HENT:     Head: Normocephalic and atraumatic.     Right Ear: External ear normal.     Left Ear: External ear normal.  Eyes:     General: No scleral icterus. Neck:     Thyroid : Thyromegaly present.  Cardiovascular:     Rate and Rhythm: Normal rate and regular rhythm.     Heart sounds: Murmur heard.  Pulmonary:     Effort: Pulmonary effort is normal. No respiratory distress.     Breath sounds: Normal breath sounds. No wheezing.  Musculoskeletal:     Cervical back: Neck supple.  Skin:    General: Skin is warm and dry.     Comments: Hyperpigmentation noted beneath both breasts  Neurological:     Mental Status: She is alert and oriented to person, place, and time.  Psychiatric:        Mood and Affect: Mood normal.        Behavior: Behavior  normal.        Thought Content: Thought content normal.        Judgment: Judgment normal.    Diabetic Foot Exam - Simple   Simple Foot Form Diabetic Foot exam was performed with the following findings: Yes 04/01/2024 10:20 AM  Visual Inspection No deformities, no ulcerations, no other skin breakdown bilaterally: Yes Sensation Testing Intact to touch and monofilament testing bilaterally: Yes Pulse Check Posterior Tibialis and Dorsalis pulse intact bilaterally: Yes Comments       BP (!) 126/55 (BP Location: Right Arm, Patient Position: Sitting, Cuff Size: Large)   Pulse 64   Temp 98.6 F (37 C) (Oral)   Resp 16   Ht 5' 6 (1.676 m)   Wt 228 lb (103.4 kg)   LMP 02/28/2007   SpO2 98%   BMI 36.80 kg/m  Wt Readings from Last 3 Encounters:  04/01/24 228 lb (103.4 kg)  12/27/23 230 lb 6.4 oz (104.5 kg)  11/27/23 233 lb (105.7 kg)       Assessment & Plan:   Problem List Items Addressed This Visit       Unprioritized   Type 2 diabetes mellitus, controlled, with renal complications (HCC)   Lab Results  Component Value Date   HGBA1C 7.0 (H) 11/27/2023   HGBA1C 6.9 (H) 08/29/2023   HGBA1C 6.4 (H) 02/23/2023   Lab Results  Component Value Date   LDLCALC 75 02/23/2023   CREATININE 0.94 08/29/2023   Maintained on januvia , not working much on diet- update A1C.  Relevant Medications   sitaGLIPtin  (JANUVIA ) 50 MG tablet   esomeprazole  (NEXIUM ) 20 MG capsule   valsartan  (DIOVAN ) 80 MG tablet   Other Relevant Orders   HgB A1c   Comp Met (CMET)   Urine Microalbumin w/creat. ratio   Seasonal allergies   Stable currently without zyrtec . Generally start to act up in the fall       Retinal artery occlusion, branch, left   On aspirin  81 mg once daily for secondary prevention.      Relevant Medications   valsartan  (DIOVAN ) 80 MG tablet   amLODipine  (NORVASC ) 10 MG tablet   Renal cyst, left   US  most consistent with simple appearing cyst.       Palpitations  - Primary   New.  EKG is performed today and personally reviewed. Notes few PAC's.  Monitor. If increase in frequency/severity of symptoms I have asked her to let me know and we could plan Zio monitor at that time.      Relevant Orders   TSH   CBC w/Diff   EKG 12-Lead (Completed)   Multiple thyroid  nodules   2020 US :   IMPRESSION: 1. No significant interval change in the size or appearance of the previously biopsied right inferior thyroid  nodule. Recommend correlation with prior biopsy results. 2. Stable appearance of diffusely heterogeneous and enlarged thyroid  gland.    FNA 2019 right inferior nodule- [place] Category I.      Microalbuminuria   Maintained on valsartan .       Relevant Medications   valsartan  (DIOVAN ) 80 MG tablet   Intertrigo   Prefers ointment to powder. New rx sent for nystatin  ointment.       Relevant Medications   nystatin -triamcinolone  ointment (MYCOLOG)   Hyperlipidemia   Lab Results  Component Value Date   CHOL 147 02/23/2023   HDL 57 02/23/2023   LDLCALC 75 02/23/2023   TRIG 70 02/23/2023   CHOLHDL 2.6 02/23/2023   Stable on lipitor, update today.        Relevant Medications   valsartan  (DIOVAN ) 80 MG tablet   amLODipine  (NORVASC ) 10 MG tablet   Other Relevant Orders   Lipid panel   GERD (gastroesophageal reflux disease)   Stable on prn nexium .       Relevant Medications   esomeprazole  (NEXIUM ) 20 MG capsule   Essential hypertension   Bp stable on amlodipine  and valsartan . Continue same.       Relevant Medications   valsartan  (DIOVAN ) 80 MG tablet   amLODipine  (NORVASC ) 10 MG tablet   Other Visit Diagnoses       Seasonal allergic rhinitis, unspecified trigger       Relevant Medications   cetirizine  (ZYRTEC ) 10 MG tablet       I have changed Dorothe HERO. Lame's Januvia  to sitaGLIPtin . I have also changed her amLODipine . I am also having her start on Vitamin D3 and nystatin -triamcinolone  ointment. Additionally, I am  having her maintain her calcium -vitamin D, Centrum Ultra Womens, acetaminophen , aspirin  EC, Blood Glucose Monitoring Suppl, nystatin , atorvastatin , esomeprazole , valsartan , and cetirizine .  Meds ordered this encounter  Medications   Cholecalciferol (VITAMIN D3) 75 MCG (3000 UT) TABS    Sig: Take 1 tablet by mouth daily at 6 (six) AM.    Supervising Provider:   DOMENICA BLACKBIRD A [[id number]]   sitaGLIPtin  (JANUVIA ) 50 MG tablet    Sig: Take 1 tablet (50 mg total) by mouth daily.    Dispense:  90 tablet    Refill:  3  Supervising Provider:   DOMENICA BLACKBIRD A [[id number]]   esomeprazole  (NEXIUM ) 20 MG capsule    Sig: Take 1 capsule (20 mg total) by mouth daily as needed.    Dispense:  90 capsule    Refill:  1    Supervising Provider:   DOMENICA BLACKBIRD A [[id number]]   valsartan  (DIOVAN ) 80 MG tablet    Sig: Take 1 tablet (80 mg total) by mouth daily.    Dispense:  90 tablet    Refill:  1    Supervising Provider:   DOMENICA BLACKBIRD A [[id number]]   cetirizine  (ZYRTEC ) 10 MG tablet    Sig: Take 1 tablet (10 mg total) by mouth daily.    Dispense:  90 tablet    Refill:  4    Supervising Provider:   DOMENICA BLACKBIRD A [[id number]]   nystatin -triamcinolone  ointment (MYCOLOG)    Sig: Apply 1 Application topically 2 (two) times daily.    Dispense:  30 g    Refill:  2    Supervising Provider:   DOMENICA BLACKBIRD A [[id number]]   amLODipine  (NORVASC ) 10 MG tablet    Sig: Take 1 tablet (10 mg total) by mouth daily.    Dispense:  90 tablet    Refill:  1    Supervising Provider:   DOMENICA BLACKBIRD A [[id number]]

## 2024-04-01 NOTE — Assessment & Plan Note (Signed)
 On aspirin  81 mg once daily for secondary prevention.

## 2024-04-01 NOTE — Assessment & Plan Note (Signed)
 Bp stable on amlodipine  and valsartan . Continue same.

## 2024-04-01 NOTE — Assessment & Plan Note (Signed)
 Lab Results  Component Value Date   CHOL 147 02/23/2023   HDL 57 02/23/2023   LDLCALC 75 02/23/2023   TRIG 70 02/23/2023   CHOLHDL 2.6 02/23/2023   Stable on lipitor, update today.

## 2024-04-01 NOTE — Assessment & Plan Note (Signed)
 Lab Results  Component Value Date   HGBA1C 7.0 (H) 11/27/2023   HGBA1C 6.9 (H) 08/29/2023   HGBA1C 6.4 (H) 02/23/2023   Lab Results  Component Value Date   LDLCALC 75 02/23/2023   CREATININE 0.94 08/29/2023   Maintained on januvia , not working much on diet- update A1C.

## 2024-04-01 NOTE — Assessment & Plan Note (Addendum)
 2020 US :   IMPRESSION: 1. No significant interval change in the size or appearance of the previously biopsied right inferior thyroid  nodule. Recommend correlation with prior biopsy results. 2. Stable appearance of diffusely heterogeneous and enlarged thyroid  gland.    FNA 2019 right inferior nodule- [place] Category I.

## 2024-04-01 NOTE — Assessment & Plan Note (Signed)
Stable on prn nexium. 

## 2024-04-01 NOTE — Assessment & Plan Note (Signed)
 Maintained on valsartan .

## 2024-04-09 ENCOUNTER — Ambulatory Visit

## 2024-04-09 VITALS — BP 126/65 | Ht 67.0 in | Wt 223.0 lb

## 2024-04-09 DIAGNOSIS — Z0000 Encounter for general adult medical examination without abnormal findings: Secondary | ICD-10-CM | POA: Diagnosis not present

## 2024-04-09 NOTE — Patient Instructions (Signed)
 Nancy Deleon , Thank you for taking time out of your busy schedule to complete your Annual Wellness Visit with me. I enjoyed our conversation and look forward to speaking with you again next year. I, as well as your care team,  appreciate your ongoing commitment to your health goals. Please review the following plan we discussed and let me know if I can assist you in the future. Your Game plan/ To Do List    Referrals: If you haven't heard from the office you've been referred to, please reach out to them at the phone provided.   Follow up Visits: We will see or speak with you next year for your Next Medicare AWV with our clinical staff Have you seen your provider in the last 6 months (3 months if uncontrolled diabetes)? Yes  Clinician Recommendations:  Aim for 30 minutes of exercise or brisk walking, 6-8 glasses of water, and 5 servings of fruits and vegetables each day.       This is a list of the screenings recommended for you:  Health Maintenance  Topic Date Due   Flu Shot  03/07/2024   COVID-19 Vaccine (5 - 2025-26 season) 04/07/2024   Eye exam for diabetics  04/22/2024   Hemoglobin A1C  10/02/2024   Yearly kidney function blood test for diabetes  04/01/2025   Yearly kidney health urinalysis for diabetes  04/01/2025   Complete foot exam   04/01/2025   Medicare Annual Wellness Visit  04/09/2025   Mammogram  10/08/2025   DTaP/Tdap/Td vaccine (4 - Td or Tdap) 09/16/2028   Colon Cancer Screening  12/31/2029   Pneumococcal Vaccine for age over 49  Completed   DEXA scan (bone density measurement)  Completed   Hepatitis C Screening  Completed   Zoster (Shingles) Vaccine  Completed   HPV Vaccine  Aged Out   Meningitis B Vaccine  Aged Out    Advanced directives: (Declined) Advance directive discussed with you today. Even though you declined this today, please call our office should you change your mind, and we can give you the proper paperwork for you to fill out. Advance Care Planning  is important because it:  [x]  Makes sure you receive the medical care that is consistent with your values, goals, and preferences  [x]  It provides guidance to your family and loved ones and reduces their decisional burden about whether or not they are making the right decisions based on your wishes.  Follow the link provided in your after visit summary or read over the paperwork we have mailed to you to help you started getting your Advance Directives in place. If you need assistance in completing these, please reach out to us  so that we can help you!  See attachments for Preventive Care and Fall Prevention Tips.

## 2024-04-09 NOTE — Progress Notes (Signed)
 Because this visit was a virtual/telehealth visit,  certain criteria was not obtained, such a blood pressure, CBG if applicable, and timed get up and go. Any medications not marked as taking were not mentioned during the medication reconciliation part of the visit. Any vitals not documented were not able to be obtained due to this being a telehealth visit or patient was unable to self-report a recent blood pressure reading due to a lack of equipment at home via telehealth. Vitals that have been documented are verbally provided by the patient.  This visit was performed by a medical professional under my direct supervision. I was immediately available for consultation/collaboration. I have reviewed and agree with the Annual Wellness Visit documentation.  Subjective:   Nancy Deleon is a 68 y.o. who presents for a Medicare Wellness preventive visit.  As a reminder, Annual Wellness Visits don't include a physical exam, and some assessments may be limited, especially if this visit is performed virtually. We may recommend an in-person follow-up visit with your provider if needed.  Visit Complete: Virtual I connected with  Nancy Deleon on 04/09/24 by a audio enabled telemedicine application and verified that I am speaking with the correct person using two identifiers.  Patient Location: Home  Provider Location: Home Office  I discussed the limitations of evaluation and management by telemedicine. The patient expressed understanding and agreed to proceed.  Vital Signs: Because this visit was a virtual/telehealth visit, some criteria may be missing or patient reported. Any vitals not documented were not able to be obtained and vitals that have been documented are patient reported.  VideoDeclined- This patient declined Librarian, academic. Therefore the visit was completed with audio only.  Persons Participating in Visit: Patient.  AWV Questionnaire:  No: Patient Medicare AWV questionnaire was not completed prior to this visit.  Cardiac Risk Factors include: advanced age (>43men, >34 women);diabetes mellitus;obesity (BMI >30kg/m2);dyslipidemia;hypertension     Objective:    Today's Vitals   04/09/24 1020  BP: 126/65  Weight: 223 lb (101.2 kg)  Height: 5' 7 (1.702 m)   Body mass index is 34.93 kg/m.     04/09/2024   10:19 AM 10/12/2022    9:40 AM 04/23/2019    8:57 AM 05/01/2017    7:51 AM 04/25/2017    4:27 PM 04/16/2015    8:37 PM  Advanced Directives  Does Patient Have a Medical Advance Directive? No No No No  No  Yes   Type of Careers adviser;Living will   Would patient like information on creating a medical advance directive? No - Patient declined No - Patient declined No - Patient declined No - Patient declined        Data saved with a previous flowsheet row definition    Current Medications (verified) Outpatient Encounter Medications as of 04/09/2024  Medication Sig   acetaminophen  (TYLENOL ) 500 MG tablet Take 1,000 mg by mouth every 6 (six) hours as needed.   amLODipine  (NORVASC ) 10 MG tablet Take 1 tablet (10 mg total) by mouth daily.   aspirin  EC 81 MG tablet Take 1 tablet (81 mg total) by mouth daily. Swallow whole.   atorvastatin  (LIPITOR) 10 MG tablet TAKE 1 TABLET DAILY   Blood Glucose Monitoring Suppl DEVI 1 each by Does not apply route in the morning, at noon, and at bedtime. May substitute to any manufacturer covered by patient's insurance.   calcium -vitamin D (OSCAL WITH D) 500-200 MG-UNIT  tablet Take 1 tablet by mouth.   cetirizine  (ZYRTEC ) 10 MG tablet Take 1 tablet (10 mg total) by mouth daily.   Cholecalciferol (VITAMIN D3) 75 MCG (3000 UT) TABS Take 1 tablet by mouth daily at 6 (six) AM.   esomeprazole  (NEXIUM ) 20 MG capsule Take 1 capsule (20 mg total) by mouth daily as needed.   Multiple Vitamins-Minerals (CENTRUM ULTRA WOMENS) TABS Take 1 tablet by mouth daily.    nystatin  (MYCOSTATIN /NYSTOP ) powder Apply 1 Application topically 3 (three) times daily.   nystatin -triamcinolone  ointment (MYCOLOG) Apply 1 Application topically 2 (two) times daily.   sitaGLIPtin  (JANUVIA ) 50 MG tablet Take 1 tablet (50 mg total) by mouth daily.   valsartan  (DIOVAN ) 80 MG tablet Take 1 tablet (80 mg total) by mouth daily.   No facility-administered encounter medications on file as of 04/09/2024.    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   Past Surgical History:  Procedure Laterality Date   BIOPSY THYROID      BREAST BIOPSY  04/24/11   [facility] breast   BREAST EXCISIONAL BIOPSY     BREAST LUMPECTOMY WITH RADIOACTIVE SEED LOCALIZATION Left 05/01/2017   Procedure: LEFT BREAST LUMPECTOMY WITH RADIOACTIVE SEED LOCALIZATION;  Surgeon: Belinda Cough, MD;  Location: [facility];  Service: General;  Laterality: Left;   CESAREAN SECTION  [id number]   COLONOSCOPY  2011   [facility]-  normal   toenail removal  09/2020   Dr. Burt   TUBAL LIGATION     UPPER GASTROINTESTINAL ENDOSCOPY     Family History  Problem Relation Age of Onset   Lymphoma Mother    Dementia Mother    Cancer Father        lung   Colon cancer Maternal Uncle    Esophageal cancer Neg Hx    Pancreatic cancer Neg Hx    Rectal cancer Neg Hx    Stomach cancer Neg Hx    Thyroid  disease Neg Hx    Colon polyps Neg Hx    Social History   Socioeconomic History   Marital status:  Widowed    Spouse name: Not on file   Number of children: 2   Years of education: Not on file   Highest education level: Not on file  Occupational History    Employer: [employer]  Tobacco Use   Smoking status: Never   Smokeless tobacco: Never  Vaping Use   Vaping status: Never Used  Substance and Sexual Activity   Alcohol use: Yes    Alcohol/week: 0.0 standard drinks of alcohol    Comment: occasional wine   Drug use: No   Sexual activity: Not Currently    Partners: Male    Birth control/protection: Post-menopausal    Comment: 1st intercourse 68 yo-Fewer than 5 partners  Other Topics Concern   Not on file  Social History Narrative   Regular exercise:  Walks 2-5 x weekly   Caffeine Use: no   Widowed   She has a son Lynwood- he is 32 yrs old.  Foster parent.   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   Dog (Lab named Abby)         Social Drivers of Health   Financial Resource Strain: Low Risk  (04/09/2024)   Overall Financial Resource Strain (CARDIA)    Difficulty of Paying Living Expenses: Not hard at all  Food Insecurity: No Food Insecurity (04/09/2024)   Hunger Vital Sign    Worried About Running Out of Food in the Last Year: Never true    Ran Out of Food in the Last Year: Never true  Transportation Needs: No Transportation Needs (04/09/2024)   PRAPARE - Administrator, Civil Service (Medical): No    Lack of Transportation (Non-Medical): No  Physical Activity: Sufficiently Active (04/09/2024)   Exercise Vital Sign    Days of Exercise per Week: 6 days    Minutes of Exercise per Session: 30 min  Stress: No Stress Concern Present (04/09/2024)   [employer] of Occupational Health - Occupational Stress Questionnaire    Feeling of Stress: Not at all  Social Connections: Moderately Integrated (04/09/2024)   Social Connection and Isolation Panel    Frequency of Communication with Friends and Family:  More than three times a week    Frequency of Social Gatherings with Friends and Family: Twice a week    Attends Religious Services: More than 4 times per year    Active Member of [employer] or Organizations: Yes    Attends Banker Meetings: More than 4 times per year    Marital Status: Widowed    Tobacco Counseling Counseling given: Not Answered    Clinical Intake:  Pre-visit preparation completed: Yes  Pain : No/denies pain     BMI - recorded: 34.93 Nutritional Status: BMI > 30  Obese Nutritional Risks: None Diabetes: No  Lab Results  Component Value Date   HGBA1C 7.1 (H) 04/01/2024   HGBA1C 7.0 (H) 11/27/2023   HGBA1C 6.9 (H) 08/29/2023     How often do you need to have someone help you when you read instructions, pamphlets, or other written materials from your doctor or pharmacy?: 1 - Never  Interpreter Needed?: No  Information entered by :: Toshio Slusher,CMA   Activities of Daily Living     04/09/2024   10:23 AM  In your present state of health, do you have any difficulty performing the following activities:  Hearing? 0  Vision? 0  Difficulty concentrating or making decisions? 0  Walking or climbing stairs? 0  Dressing or bathing? 0  Doing errands, shopping? 0  Preparing Food and eating ? N  Using the Toilet? N  In the past six months, have you accidently leaked urine? N  Do you have problems with loss of bowel control? N  Managing your Medications? N  Managing your Finances? N  Housekeeping or managing your Housekeeping? N    Patient Care Team: Daryl Setter, NP as PCP - General (Internal Medicine) Rockney, Evalene SQUIBB, MD (Inactive) (Obstetrics and Gynecology) Octavia Charlie Hamilton, MD as Consulting Physician (Ophthalmology)  I have updated your Care Teams any recent Medical Services you may have received from other providers in the past year.     Assessment:   This is a routine wellness examination for Nancy Deleon.  Hearing/Vision  screen Hearing Screening - Comments:: No difficulties  Vision Screening - Comments:: Patient wears glasses    Goals Addressed  This Visit's Progress    Patient Stated       Patient wants to lose some weight        Depression Screen     04/09/2024   10:24 AM 04/01/2024    9:46 AM 02/23/2023    9:55 AM 10/12/2022    9:45 AM 12/19/2021    9:24 AM 11/12/2020    8:07 AM 07/23/2020    8:26 AM  PHQ 2/9 Scores  PHQ - 2 Score 0 0 0 0 0 0 0  PHQ- 9 Score 0 0 0        Fall Risk     04/09/2024   10:22 AM 04/01/2024    9:47 AM 02/23/2023    9:54 AM 10/12/2022    9:41 AM 04/28/2022    9:17 AM  Fall Risk   Falls in the past year? 0 0 0 0 0  Number falls in past yr: 0 0 0 0 0  Injury with Fall? 0 0 0 0 0  Risk for fall due to : No Fall Risks No Fall Risks No Fall Risks No Fall Risks   Follow up Falls evaluation completed Falls evaluation completed Falls evaluation completed Falls evaluation completed     MEDICARE RISK AT HOME:  Medicare Risk at Home Any stairs in or around the home?: Yes If so, are there any without handrails?: No Home free of loose throw rugs in walkways, pet beds, electrical cords, etc?: Yes Adequate lighting in your home to reduce risk of falls?: Yes Life alert?: No Use of a cane, walker or w/c?: No Grab bars in the bathroom?: No Shower chair or bench in shower?: No Elevated toilet seat or a handicapped toilet?: No  TIMED UP AND GO:  Was the test performed?  No  Cognitive Function: 6CIT completed        04/09/2024   10:24 AM 10/12/2022    9:48 AM  6CIT Screen  What Year? 0 points 0 points  What month? 0 points 0 points  What time? 0 points 0 points  Count back from 20 0 points 0 points  Months in reverse 0 points 0 points  Repeat phrase 0 points 2 points  Total Score 0 points 2 points    Immunizations Immunization History  Administered Date(s) Administered   Fluad Quad(high Dose 65+) 04/28/2022   Influenza Whole 05/08/2012    Influenza,inj,Quad PF,6+ Mos 04/01/2014, 04/28/2015, 08/15/2017, 04/25/2018, 04/29/2019, 04/21/2020, 06/20/2021   Influenza-Unspecified 06/07/2013, 04/07/2016   PFIZER Comirnaty(Gray Top)Covid-19 Tri-Sucrose Vaccine 12/24/2020   PFIZER(Purple Top)SARS-COV-2 Vaccination 10/18/2019, 11/12/2019, 07/23/2020   PNEUMOCOCCAL CONJUGATE-20 06/20/2021   PPD Test 11/21/2011   Pneumococcal Polysaccharide-23 06/11/2018   Td 08/08/1995, 12/30/2008   Tdap 09/16/2018   Zoster Recombinant(Shingrix) 04/16/2018, 06/20/2018    Screening Tests Health Maintenance  Topic Date Due   INFLUENZA VACCINE  03/07/2024   COVID-19 Vaccine (5 - 2025-26 season) 04/07/2024   OPHTHALMOLOGY EXAM  04/22/2024   HEMOGLOBIN A1C  10/02/2024   Diabetic kidney evaluation - eGFR measurement  04/01/2025   Diabetic kidney evaluation - Urine ACR  04/01/2025   FOOT EXAM  04/01/2025   Medicare Annual Wellness (AWV)  04/09/2025   MAMMOGRAM  10/08/2025   DTaP/Tdap/Td (4 - Td or Tdap) 09/16/2028   Colonoscopy  12/31/2029   Pneumococcal Vaccine: 50+ Years  Completed   DEXA SCAN  Completed   Hepatitis C Screening  Completed   Zoster Vaccines- Shingrix  Completed   HPV VACCINES  Aged Out   Meningococcal  B Vaccine  Aged Out    Health Maintenance  Health Maintenance Due  Topic Date Due   INFLUENZA VACCINE  03/07/2024   COVID-19 Vaccine (5 - 2025-26 season) 04/07/2024   Health Maintenance Items Addressed:patient declined   Additional Screening:  Vision Screening: Recommended annual ophthalmology exams for early detection of glaucoma and other disorders of the eye. Would you like a referral to an eye doctor? No    Dental Screening: Recommended annual dental exams for proper oral hygiene  Community Resource Referral / Chronic Care Management: CRR required this visit?  No   CCM required this visit?  No   Plan:    I have personally reviewed and noted the following in the patient's chart:   Medical and social  history Use of alcohol, tobacco or illicit drugs  Current medications and supplements including opioid prescriptions. Patient is not currently taking opioid prescriptions. Functional ability and status Nutritional status Physical activity Advanced directives List of other physicians Hospitalizations, surgeries, and ER visits in previous 12 months Vitals Screenings to include cognitive, depression, and falls Referrals and appointments  In addition, I have reviewed and discussed with patient certain preventive protocols, quality metrics, and best practice recommendations. A written personalized care plan for preventive services as well as general preventive health recommendations were provided to patient.   Lyle MARLA Right, [place]   04/09/2024   After Visit Summary: (MyChart) Due to this being a telephonic visit, the after visit summary with patients personalized plan was offered to patient via MyChart   Notes: Nothing significant to report at this time.

## 2024-04-26 ENCOUNTER — Other Ambulatory Visit: Payer: Self-pay | Admitting: Family

## 2024-04-26 DIAGNOSIS — I10 Essential (primary) hypertension: Secondary | ICD-10-CM

## 2024-05-01 DIAGNOSIS — H43813 Vitreous degeneration, bilateral: Secondary | ICD-10-CM | POA: Diagnosis not present

## 2024-05-01 DIAGNOSIS — H40013 Open angle with borderline findings, low risk, bilateral: Secondary | ICD-10-CM | POA: Diagnosis not present

## 2024-05-01 DIAGNOSIS — H16223 Keratoconjunctivitis sicca, not specified as Sjogren's, bilateral: Secondary | ICD-10-CM | POA: Diagnosis not present

## 2024-05-01 DIAGNOSIS — H34232 Retinal artery branch occlusion, left eye: Secondary | ICD-10-CM | POA: Diagnosis not present

## 2024-06-26 ENCOUNTER — Other Ambulatory Visit: Payer: Self-pay | Admitting: Family

## 2024-07-02 ENCOUNTER — Ambulatory Visit (INDEPENDENT_AMBULATORY_CARE_PROVIDER_SITE_OTHER): Admitting: Family

## 2024-07-02 ENCOUNTER — Telehealth: Payer: Self-pay | Admitting: Family

## 2024-07-02 VITALS — BP 124/53 | HR 59 | Temp 98.7°F | Resp 16 | Ht 67.0 in | Wt 224.0 lb

## 2024-07-02 DIAGNOSIS — R809 Proteinuria, unspecified: Secondary | ICD-10-CM

## 2024-07-02 DIAGNOSIS — Z23 Encounter for immunization: Secondary | ICD-10-CM | POA: Diagnosis not present

## 2024-07-02 DIAGNOSIS — H34232 Retinal artery branch occlusion, left eye: Secondary | ICD-10-CM

## 2024-07-02 DIAGNOSIS — E1129 Type 2 diabetes mellitus with other diabetic kidney complication: Secondary | ICD-10-CM | POA: Diagnosis not present

## 2024-07-02 DIAGNOSIS — E785 Hyperlipidemia, unspecified: Secondary | ICD-10-CM

## 2024-07-02 DIAGNOSIS — M79604 Pain in right leg: Secondary | ICD-10-CM | POA: Diagnosis not present

## 2024-07-02 DIAGNOSIS — J302 Other seasonal allergic rhinitis: Secondary | ICD-10-CM | POA: Diagnosis not present

## 2024-07-02 DIAGNOSIS — I10 Essential (primary) hypertension: Secondary | ICD-10-CM

## 2024-07-02 DIAGNOSIS — K219 Gastro-esophageal reflux disease without esophagitis: Secondary | ICD-10-CM

## 2024-07-02 DIAGNOSIS — D509 Iron deficiency anemia, unspecified: Secondary | ICD-10-CM

## 2024-07-02 LAB — BASIC METABOLIC PANEL WITH GFR
BUN: 15 mg/dL (ref 6–23)
CO2: 30 meq/L (ref 19–32)
Calcium: 10 mg/dL (ref 8.4–10.5)
Chloride: 105 meq/L (ref 96–112)
Creatinine, Ser: 0.95 mg/dL (ref 0.40–1.20)
GFR: 61.93 mL/min (ref >60.00)
Glucose, Bld: 137 mg/dL — ABNORMAL HIGH (ref 70–99)
Potassium: 3.9 meq/L (ref 3.5–5.1)
Sodium: 141 meq/L (ref 135–145)

## 2024-07-02 LAB — HEMOGLOBIN A1C: Hgb A1c MFr Bld: 6.6 % — ABNORMAL HIGH (ref 4.6–6.5)

## 2024-07-02 MED ORDER — VALSARTAN 80 MG PO TABS
80.0000 mg | ORAL_TABLET | Freq: Every day | ORAL | 1 refills | Status: AC
Start: 2024-07-02 — End: ?

## 2024-07-02 NOTE — Assessment & Plan Note (Signed)
 Resolved  Monitor

## 2024-07-02 NOTE — Assessment & Plan Note (Signed)
 Lab Results  Component Value Date   WBC 6.2 04/01/2024   HGB 12.8 04/01/2024   HCT 37.3 04/01/2024   MCV 73.8 (L) 04/01/2024   PLT 162.0 04/01/2024

## 2024-07-02 NOTE — Patient Instructions (Signed)
  VISIT SUMMARY: Today, you came in for a follow-up on your medications and reported experiencing right leg pain last week, which has since resolved. We reviewed your diabetes management, blood pressure, cholesterol, and other health concerns.  YOUR PLAN: -TYPE 2 DIABETES MELLITUS WITH RENAL COMPLICATIONS: Type 2 diabetes is a condition where your body does not use insulin properly, leading to high blood sugar levels. Your A1c was 7.1% in August, which is slightly above the target. Continue taking Januvia  as prescribed.  -ESSENTIAL HYPERTENSION: Essential hypertension is high blood pressure with no identifiable cause. Your blood pressure is being managed with valsartan  and amlodipine . Continue taking these medications as prescribed. A refill for valsartan  has been sent to [facility] on [address].  -HYPERLIPIDEMIA: Hyperlipidemia is having high levels of fats (lipids) in your blood, which can increase your risk of heart disease. Your LDL was 68 mg/dL in August, which is within the target range. Continue taking atorvastatin  as prescribed.  -GASTROESOPHAGEAL REFLUX DISEASE: Gastroesophageal reflux disease (GERD) is a condition where stomach acid frequently flows back into the tube connecting your mouth and stomach. You have no current reflux issues. Continue using Nexium  as needed.  -SEASONAL ALLERGIC RHINITIS: Seasonal allergic rhinitis is an allergic reaction to pollen that causes sneezing, congestion, and a runny nose. Your allergies are controlled with cetirizine . Continue taking cetirizine  as needed.  -GENERAL HEALTH MAINTENANCE: You are up to date with your eye exams. We discussed the importance of getting a flu shot and a COVID booster. Please get your COVID booster at the pharmacy.  INSTRUCTIONS: Please follow up with your next A1c check to monitor your diabetes. Continue with your current medications and get your COVID booster at the pharmacy.

## 2024-07-02 NOTE — Assessment & Plan Note (Signed)
 Continues diovan  for renal protection.

## 2024-07-02 NOTE — Progress Notes (Signed)
 Subjective:     Patient ID: Nancy Deleon, female    DOB: 1956/04/16, 68 y.o.   MRN: [id number]  Chief Complaint  Patient presents with   Diabetes    Here for follow    Leg Pain    Patient reports having right leg pain    Diabetes  Leg Pain     Discussed the use of AI scribe software for clinical note transcription with the patient, who gave verbal consent to proceed.  History of Present Illness Nancy Deleon is a 69 year old female with diabetes who presents for medication follow-up and reports right leg pain.  She experienced pain in her right leg last week, describing it as soreness that extended up through her shoulder. She managed the pain by keeping the leg elevated, applying ice, and using a Cool Gel, which provided some relief. The pain has since resolved on its own, and she believes it may have been a muscle strain.  She has a history of diabetes, with her last A1c recorded at 7.1% in August. She is currently taking Januvia , despite the high cost, and manages to afford it with a copay of approximately thirty dollars. She is hopeful that her A1c will be under 7% at today's check.  She takes over-the-counter medication for allergies as needed, which she feels are under control. She also takes aspirin  81 mg daily, Diovan  for blood pressure and kidney protection, and Lipitor for cholesterol management. Her LDL was last checked in August and was at 37.  She uses Nexium  as needed for reflux and tries to avoid spicy foods. She is also on amlodipine  for blood pressure, which she reports does not cause swelling in her feet.  She is up to date with her eye doctor visits and reports no issues with her vision. She is also using Mycolog cream on her face as needed.  She is a Manufacturing Systems Engineer and comes from a large family with six siblings. No current concerns about anxiety and no issues with swelling in her feet from amlodipine  use.      Health  Maintenance Due  Topic Date Due   COVID-19 Vaccine (5 - 2025-26 season) 04/07/2024   OPHTHALMOLOGY EXAM  04/22/2024    Past Medical History:  Diagnosis Date   Allergy    Fatty liver 04/03/2014   GERD (gastroesophageal reflux disease)    Hypertension    Intraductal papilloma of breast 08/15/2017   Multiple thyroid  nodules 08/20/2017   Obesity    Type 2 diabetes mellitus, controlled, with renal complications (HCC) 03/13/2018   12/18/19-pt states she does not have diabetes, just checks blood sugar as needed.    Past Surgical History:  Procedure Laterality Date   BIOPSY THYROID      BREAST BIOPSY  04/24/11   [facility] breast   BREAST EXCISIONAL BIOPSY     BREAST LUMPECTOMY WITH RADIOACTIVE SEED LOCALIZATION Left 05/01/2017   Procedure: LEFT BREAST LUMPECTOMY WITH RADIOACTIVE SEED LOCALIZATION;  Surgeon: Belinda Cough, MD;  Location: [facility];  Service: General;  Laterality: Left;   CESAREAN SECTION  [id number]   COLONOSCOPY  2011   [facility]-  normal   toenail removal  09/2020   Dr. Burt   TUBAL LIGATION     UPPER GASTROINTESTINAL ENDOSCOPY      Family History  Problem Relation Age of Onset   Lymphoma Mother    Dementia Mother    Cancer Father        lung  Colon cancer Maternal Uncle    Esophageal cancer Neg Hx    Pancreatic cancer Neg Hx    Rectal cancer Neg Hx    Stomach cancer Neg Hx    Thyroid  disease Neg Hx    Colon polyps Neg Hx     Social History   Socioeconomic History   Marital status: Widowed    Spouse name: Not on file   Number of children: 2   Years of education: Not on file   Highest education level: Not on file  Occupational History    Employer: [employer]  Tobacco Use   Smoking status: Never   Smokeless tobacco: Never  Vaping Use   Vaping status: Never Used  Substance and Sexual Activity   Alcohol use: Yes    Alcohol/week: 0.0 standard drinks of alcohol    Comment: occasional wine   Drug use: No   Sexual activity: Not  Currently    Partners: Male    Birth control/protection: Post-menopausal    Comment: 1st intercourse 68 yo-Fewer than 5 partners  Other Topics Concern   Not on file  Social History Narrative   Regular exercise:  Walks 2-5 x weekly   Caffeine Use: no   Widowed   She has a son Lynwood- he is 67 yrs old.   Foster parent.   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   Dog (Lab named Abby)         Social Drivers of Health   Financial Resource Strain: Low Risk  (04/09/2024)   Overall Financial Resource Strain (CARDIA)    Difficulty of Paying Living Expenses: Not hard at all  Food Insecurity: No Food Insecurity (04/09/2024)   Hunger Vital Sign    Worried About Running Out of Food in the Last Year: Never true    Ran Out of Food in the Last Year: Never true  Transportation Needs: No Transportation Needs (04/09/2024)   PRAPARE - Administrator, Civil Service (Medical): No    Lack of Transportation (Non-Medical): No  Physical Activity: Sufficiently Active (04/09/2024)   Exercise Vital Sign    Days of Exercise per Week: 6 days    Minutes of Exercise per Session: 30 min  Stress: No Stress Concern Present (04/09/2024)   [employer] of Occupational Health - Occupational Stress Questionnaire    Feeling of Stress: Not at all  Social Connections: Moderately Integrated (04/09/2024)   Social Connection and Isolation Panel    Frequency of Communication with Friends and Family: More than three times a week    Frequency of Social Gatherings with Friends and Family: Twice a week    Attends Religious Services: More than 4 times per year    Active Member of [employer] or Organizations: Yes    Attends Banker Meetings: More than 4 times per year    Marital Status: Widowed  Intimate Partner Violence: Not At Risk (04/09/2024)   Humiliation, Afraid, Rape, and Kick questionnaire    Fear of Current or Ex-Partner: No    Emotionally  Abused: No    Physically Abused: No    Sexually Abused: No    Outpatient Medications Prior to Visit  Medication Sig Dispense Refill   acetaminophen  (TYLENOL ) 500 MG tablet Take 1,000 mg by mouth every 6 (six) hours as needed.     amLODipine  (NORVASC ) 10 MG tablet TAKE 1 TABLET DAILY 90 tablet 1  aspirin  EC 81 MG tablet Take 1 tablet (81 mg total) by mouth daily. Swallow whole. 30 tablet 12   atorvastatin  (LIPITOR) 10 MG tablet TAKE 1 TABLET DAILY 90 tablet 3   Blood Glucose Monitoring Suppl DEVI 1 each by Does not apply route in the morning, at noon, and at bedtime. May substitute to any manufacturer covered by patient's insurance. 1 each 0   calcium -vitamin D (OSCAL WITH D) 500-200 MG-UNIT tablet Take 1 tablet by mouth.     cetirizine  (ZYRTEC ) 10 MG tablet Take 1 tablet (10 mg total) by mouth daily. 90 tablet 4   Cholecalciferol (VITAMIN D3) 75 MCG (3000 UT) TABS Take 1 tablet by mouth daily at 6 (six) AM.     esomeprazole  (NEXIUM ) 20 MG capsule Take 1 capsule (20 mg total) by mouth daily as needed. 90 capsule 1   Multiple Vitamins-Minerals (CENTRUM ULTRA WOMENS) TABS Take 1 tablet by mouth daily.     nystatin -triamcinolone  ointment (MYCOLOG) Apply 1 Application topically 2 (two) times daily. 30 g 2   sitaGLIPtin  (JANUVIA ) 50 MG tablet Take 1 tablet (50 mg total) by mouth daily. 90 tablet 3   nystatin  (MYCOSTATIN /NYSTOP ) powder Apply 1 Application topically 3 (three) times daily. 30 g 0   valsartan  (DIOVAN ) 80 MG tablet Take 1 tablet (80 mg total) by mouth daily. 90 tablet 1   No facility-administered medications prior to visit.    Allergies  Allergen Reactions   Lisinopril      cough   Metformin  And Related Other (See Comments)    nausea   Naproxen Nausea And Vomiting    REACTION: vomiting   Ropinirole  Nausea Only   Valsartan      Cough    Vicodin [Hydrocodone-Acetaminophen ] Nausea And Vomiting   Propoxyphene N-Acetaminophen  Nausea And Vomiting    ROS  See HPI     Objective:    Physical Exam Constitutional:      General: She is not in acute distress.    Appearance: Normal appearance. She is well-developed.  HENT:     Head: Normocephalic and atraumatic.     Right Ear: External ear normal.     Left Ear: External ear normal.  Eyes:     General: No scleral icterus. Neck:     Thyroid : No thyromegaly.  Cardiovascular:     Rate and Rhythm: Normal rate and regular rhythm.     Heart sounds: Normal heart sounds. No murmur heard. Pulmonary:     Effort: Pulmonary effort is normal. No respiratory distress.     Breath sounds: Normal breath sounds. No wheezing.  Musculoskeletal:     Cervical back: Neck supple.  Skin:    General: Skin is warm and dry.  Neurological:     Mental Status: She is alert and oriented to person, place, and time.  Psychiatric:        Mood and Affect: Mood normal.        Behavior: Behavior normal.        Thought Content: Thought content normal.        Judgment: Judgment normal.      BP (!) 124/53 (BP Location: Right Arm, Patient Position: Sitting, Cuff Size: Large)   Pulse (!) 59   Temp 98.7 F (37.1 C) (Oral)   Resp 16   Ht 5' 7 (1.702 m)   Wt 224 lb (101.6 kg)   LMP 02/28/2007   SpO2 99%   BMI 35.08 kg/m  Wt Readings from Last 3 Encounters:  07/02/24 224 lb (101.6  kg)  04/09/24 223 lb (101.2 kg)  04/01/24 228 lb (103.4 kg)       Assessment & Plan:   Problem List Items Addressed This Visit       Unprioritized   Type 2 diabetes mellitus, controlled, with renal complications (HCC)   Lab Results  Component Value Date   HGBA1C 7.1 (H) 04/01/2024   HGBA1C 7.0 (H) 11/27/2023   HGBA1C 6.9 (H) 08/29/2023   Lab Results  Component Value Date   MICROALBUR 2.5 (H) 04/01/2024   LDLCALC 68 04/01/2024   CREATININE 0.92 04/01/2024   Type 2 diabetes mellitus with renal complications A1c was 7.1% in August, slightly above target. - Continue Januvia .      Relevant Medications   valsartan  (DIOVAN ) 80 MG  tablet   Seasonal allergies   Stable with otc zyrtec .       Right leg pain - Primary   Resolved. Monitor.       Retinal artery occlusion, branch, left   Continues aspirin  81mg  for secondary prevention, continues to follow with Opthlamology.       Relevant Medications   valsartan  (DIOVAN ) 80 MG tablet   Microalbuminuria   Continues diovan  for renal protection.       Relevant Medications   valsartan  (DIOVAN ) 80 MG tablet   Hyperlipidemia   Lab Results  Component Value Date   CHOL 138 04/01/2024   HDL 49.70 04/01/2024   LDLCALC 68 04/01/2024   TRIG 104.0 04/01/2024   CHOLHDL 3 04/01/2024   LDL at goal on lipitor, continue same.       Relevant Medications   valsartan  (DIOVAN ) 80 MG tablet   GERD (gastroesophageal reflux disease)   Stable on nexium  prn.       Essential hypertension   BP Readings from Last 3 Encounters:  07/02/24 (!) 124/53  04/09/24 126/65  04/01/24 (!) 126/55   BP stable on amlodipine  and januvia .       Relevant Medications   valsartan  (DIOVAN ) 80 MG tablet   ANEMIA, IRON DEFICIENCY   Lab Results  Component Value Date   WBC 6.2 04/01/2024   HGB 12.8 04/01/2024   HCT 37.3 04/01/2024   MCV 73.8 (L) 04/01/2024   PLT 162.0 04/01/2024         Other Visit Diagnoses       Controlled type 2 diabetes mellitus with microalbuminuria, without long-term current use of insulin (HCC)       Relevant Medications   valsartan  (DIOVAN ) 80 MG tablet   Other Relevant Orders   HgB A1c (Completed)   Basic Metabolic Panel (BMET) (Completed)     Needs flu shot       Relevant Orders   Flu vaccine HIGH DOSE PF(Fluzone Trivalent) (Completed)       General Health Maintenance Up to date with eye exams. Discussed flu shot and COVID booster. - Encouraged COVID booster at pharmacy. Assessment & Plan     I have discontinued Nancy Deleon. Nancy Deleon's nystatin . I am also having her maintain her calcium -vitamin D, Centrum Ultra Womens, acetaminophen , aspirin  EC,  Blood Glucose Monitoring Suppl, Vitamin D3, sitaGLIPtin , esomeprazole , cetirizine , nystatin -triamcinolone  ointment, amLODipine , atorvastatin , and valsartan .  Meds ordered this encounter  Medications   valsartan  (DIOVAN ) 80 MG tablet    Sig: Take 1 tablet (80 mg total) by mouth daily.    Dispense:  90 tablet    Refill:  1    Supervising Provider:   DOMENICA BLACKBIRD A [[id number]]

## 2024-07-02 NOTE — Assessment & Plan Note (Signed)
 Stable with otc zyrtec .

## 2024-07-02 NOTE — Assessment & Plan Note (Signed)
 Stable on nexium prn.

## 2024-07-02 NOTE — Assessment & Plan Note (Signed)
 Lab Results  Component Value Date   CHOL 138 04/01/2024   HDL 49.70 04/01/2024   LDLCALC 68 04/01/2024   TRIG 104.0 04/01/2024   CHOLHDL 3 04/01/2024   LDL at goal on lipitor, continue same.

## 2024-07-02 NOTE — Assessment & Plan Note (Addendum)
 Lab Results  Component Value Date   HGBA1C 7.1 (H) 04/01/2024   HGBA1C 7.0 (H) 11/27/2023   HGBA1C 6.9 (H) 08/29/2023   Lab Results  Component Value Date   MICROALBUR 2.5 (H) 04/01/2024   LDLCALC 68 04/01/2024   CREATININE 0.92 04/01/2024   Type 2 diabetes mellitus with renal complications A1c was 7.1% in August, slightly above target. - Continue Januvia .

## 2024-07-02 NOTE — Assessment & Plan Note (Signed)
 Continues aspirin  81mg  for secondary prevention, continues to follow with Opthlamology.

## 2024-07-02 NOTE — Telephone Encounter (Signed)
 Electronic request sent

## 2024-07-02 NOTE — Telephone Encounter (Signed)
 Please call [facility] to request a copy of DM eye exam.

## 2024-07-02 NOTE — Assessment & Plan Note (Signed)
 BP Readings from Last 3 Encounters:  07/02/24 (!) 124/53  04/09/24 126/65  04/01/24 (!) 126/55   BP stable on amlodipine  and januvia .

## 2024-07-05 ENCOUNTER — Ambulatory Visit: Payer: Self-pay | Admitting: Family

## 2024-07-30 ENCOUNTER — Other Ambulatory Visit: Payer: Self-pay | Admitting: Family

## 2024-07-30 DIAGNOSIS — E1129 Type 2 diabetes mellitus with other diabetic kidney complication: Secondary | ICD-10-CM

## 2024-08-26 ENCOUNTER — Other Ambulatory Visit: Payer: Self-pay | Admitting: Family

## 2024-08-26 DIAGNOSIS — Z1231 Encounter for screening mammogram for malignant neoplasm of breast: Secondary | ICD-10-CM

## 2024-10-07 ENCOUNTER — Ambulatory Visit: Admitting: Family

## 2024-10-09 ENCOUNTER — Ambulatory Visit

## 2025-04-21 ENCOUNTER — Ambulatory Visit
